# Patient Record
Sex: Female | Born: 1935 | Race: White | Hispanic: No | Marital: Married | Smoking: Never smoker
Health system: Southern US, Community
[De-identification: ages and names within clinical notes are randomized; demographics above are authoritative.]

## PROBLEM LIST (undated history)

## (undated) DIAGNOSIS — H539 Unspecified visual disturbance: Secondary | ICD-10-CM

## (undated) DIAGNOSIS — E785 Hyperlipidemia, unspecified: Secondary | ICD-10-CM

## (undated) DIAGNOSIS — R251 Tremor, unspecified: Secondary | ICD-10-CM

## (undated) DIAGNOSIS — Z950 Presence of cardiac pacemaker: Secondary | ICD-10-CM

## (undated) DIAGNOSIS — I495 Sick sinus syndrome: Secondary | ICD-10-CM

## (undated) DIAGNOSIS — I10 Essential (primary) hypertension: Secondary | ICD-10-CM

## (undated) DIAGNOSIS — Z7901 Long term (current) use of anticoagulants: Secondary | ICD-10-CM

## (undated) DIAGNOSIS — I480 Paroxysmal atrial fibrillation: Secondary | ICD-10-CM

## (undated) DIAGNOSIS — R600 Localized edema: Secondary | ICD-10-CM

## (undated) DIAGNOSIS — E039 Hypothyroidism, unspecified: Secondary | ICD-10-CM

## (undated) HISTORY — DX: Hyperlipidemia, unspecified: E78.5

## (undated) HISTORY — DX: Sick sinus syndrome: I49.5

## (undated) HISTORY — DX: Essential (primary) hypertension: I10

## (undated) HISTORY — DX: Unspecified visual disturbance: H53.9

## (undated) HISTORY — DX: Presence of cardiac pacemaker: Z95.0

## (undated) HISTORY — DX: Localized edema: R60.0

## (undated) HISTORY — DX: Paroxysmal atrial fibrillation: I48.0

## (undated) HISTORY — PX: CATARACT EXTRACTION: SUR2

## (undated) HISTORY — DX: Long term (current) use of anticoagulants: Z79.01

## (undated) HISTORY — DX: Tremor, unspecified: R25.1

---

## 1996-11-30 DIAGNOSIS — I480 Paroxysmal atrial fibrillation: Secondary | ICD-10-CM

## 1996-11-30 HISTORY — DX: Paroxysmal atrial fibrillation: I48.0

## 2003-12-01 HISTORY — PX: CHOLECYSTECTOMY: SHX55

## 2008-10-09 ENCOUNTER — Encounter: Admission: RE | Admit: 2008-10-09 | Discharge: 2008-10-09 | Payer: Self-pay | Admitting: Internal Medicine

## 2010-04-10 HISTORY — PX: US ECHOCARDIOGRAPHY: HXRAD669

## 2010-07-31 DIAGNOSIS — Z950 Presence of cardiac pacemaker: Secondary | ICD-10-CM

## 2010-07-31 HISTORY — DX: Presence of cardiac pacemaker: Z95.0

## 2010-08-25 ENCOUNTER — Ambulatory Visit: Payer: Self-pay | Admitting: Cardiology

## 2010-08-28 ENCOUNTER — Ambulatory Visit (HOSPITAL_COMMUNITY): Admission: RE | Admit: 2010-08-28 | Discharge: 2010-08-29 | Payer: Self-pay | Admitting: Cardiology

## 2010-08-28 ENCOUNTER — Ambulatory Visit: Payer: Self-pay | Admitting: Cardiology

## 2010-08-28 HISTORY — PX: INSERT / REPLACE / REMOVE PACEMAKER: SUR710

## 2010-09-01 ENCOUNTER — Encounter: Payer: Self-pay | Admitting: Internal Medicine

## 2010-09-01 ENCOUNTER — Ambulatory Visit: Payer: Self-pay | Admitting: Cardiology

## 2010-09-05 ENCOUNTER — Ambulatory Visit: Payer: Self-pay | Admitting: Cardiovascular Disease

## 2010-09-10 ENCOUNTER — Ambulatory Visit: Payer: Self-pay | Admitting: Cardiovascular Disease

## 2010-09-25 ENCOUNTER — Ambulatory Visit: Payer: Self-pay | Admitting: Cardiology

## 2010-10-02 ENCOUNTER — Telehealth (INDEPENDENT_AMBULATORY_CARE_PROVIDER_SITE_OTHER): Payer: Self-pay | Admitting: *Deleted

## 2010-10-20 ENCOUNTER — Encounter (INDEPENDENT_AMBULATORY_CARE_PROVIDER_SITE_OTHER): Payer: Self-pay | Admitting: *Deleted

## 2010-10-21 ENCOUNTER — Ambulatory Visit: Payer: Self-pay | Admitting: Cardiology

## 2010-10-22 ENCOUNTER — Telehealth (INDEPENDENT_AMBULATORY_CARE_PROVIDER_SITE_OTHER): Payer: Self-pay | Admitting: *Deleted

## 2010-11-06 ENCOUNTER — Encounter (INDEPENDENT_AMBULATORY_CARE_PROVIDER_SITE_OTHER): Payer: Self-pay | Admitting: *Deleted

## 2010-12-08 DIAGNOSIS — Z950 Presence of cardiac pacemaker: Secondary | ICD-10-CM | POA: Insufficient documentation

## 2010-12-09 ENCOUNTER — Ambulatory Visit
Admission: RE | Admit: 2010-12-09 | Discharge: 2010-12-09 | Payer: Self-pay | Source: Home / Self Care | Attending: Internal Medicine | Admitting: Internal Medicine

## 2010-12-09 ENCOUNTER — Encounter: Payer: Self-pay | Admitting: Internal Medicine

## 2010-12-09 ENCOUNTER — Telehealth: Payer: Self-pay | Admitting: Internal Medicine

## 2010-12-09 DIAGNOSIS — I4819 Other persistent atrial fibrillation: Secondary | ICD-10-CM | POA: Insufficient documentation

## 2010-12-09 DIAGNOSIS — I10 Essential (primary) hypertension: Secondary | ICD-10-CM | POA: Insufficient documentation

## 2010-12-30 NOTE — Letter (Signed)
Summary: Appointment - Reminder 2  [facility], Main Office  [address]   [place], [place] [address]   Phone: [phone]  Fax: [phone]     November 06, 2010 MRN: [id number]   Anne Macias [address] [place], [place]  [address]   Dear [name],  Our records indicate that it is time to schedule a follow-up appointment.  Dr.Taylor recommended that you follow up with [place] in January. It is very important that we reach you to schedule this appointment. We look forward to participating in your health care needs as we are now doing all pacemaker checks for [facility]. Please contact us at the number listed above at your earliest convenience to schedule your appointment.  If you are unable to make an appointment at this time, give [place] a call so we can update our records.     Sincerely,   Glass blower/designer

## 2010-12-30 NOTE — Progress Notes (Signed)
  Phone Note Call from Patient   Caller: Patient Summary of Call: pt calling in response to letter re past due wound ck, pt states she had this done at dr tennent's office in october Initial call taken by: Glynda Jaeger,  October 22, 2010 2:13 PM

## 2010-12-30 NOTE — Letter (Signed)
Summary: Appointment - Reminder 2  [facility], Main Office  [address]   [place], [place] [address]   Phone: [phone]  Fax: [phone]     October 20, 2010 MRN: [id number]   MONTGOMERY ROTHLISBERGER [address] [place], [place]  [address]   Dear Ms. QUIRARTE,  Our records indicate that it is time to schedule a follow-up appointment. Dr.Tennant recommended that you follow up with [place] in October. It is very important that we reach you to schedule this appointment. We look forward to participating in your health care needs. Please contact us at the number listed above at your earliest convenience to schedule your appointment.  If you are unable to make an appointment at this time, give [place] a call so we can update our records.     Sincerely,   Glass blower/designer

## 2010-12-30 NOTE — Miscellaneous (Signed)
Summary: Device preload  Clinical Lists Changes  Observations: Added new observation of PPM INDICATN: Sick sinus syndrome (09/01/2010 12:41) Added new observation of MAGNET RTE: BOL85 ERI  85 (09/01/2010 12:41) Added new observation of PPMLEADSTAT2: active (09/01/2010 12:41) Added new observation of PPMLEADSER2: [id number] (09/01/2010 12:41) Added new observation of PPMLEADMOD2: 5086  (09/01/2010 12:41) Added new observation of PPMLEADLOC2: RV  (09/01/2010 12:41) Added new observation of PPMLEADSTAT1: active  (09/01/2010 12:41) Added new observation of PPMLEADSER1: [id number]  (09/01/2010 12:41) Added new observation of PPMLEADMOD1: 5086  (09/01/2010 12:41) Added new observation of PPMLEADLOC1: RA  (09/01/2010 12:41) Added new observation of PPM IMP MD: Roger Shelter, MD  (09/01/2010 12:41) Added new observation of PPMLEADDOI2: 08/28/2010  (09/01/2010 12:41) Added new observation of PPMLEADDOI1: 08/28/2010  (09/01/2010 12:41) Added new observation of PPM DOI: 08/28/2010  (09/01/2010 12:41) Added new observation of PPM SERL#: [id number]  (09/01/2010 12:41) Added new observation of PPM MODL#: [id number]  (09/01/2010 08:65) Added new observation of PACEMAKERMFG: [employer]tronic  (09/01/2010 12:41) Added new observation of PPM REFER MD: Roger Shelter, MD  (09/01/2010 12:41) Added new observation of PACEMAKER MD: Lewayne Bunting, MD  (09/01/2010 12:41)      PPM Specifications Following MD:  Lewayne Bunting, MD     Referring MD:  Roger Shelter, MD PPM Vendor:  [employer]tronic     PPM Model Number:  [id number]     PPM Serial Number:  [id number] PPM DOI:  08/28/2010     PPM Implanting MD:  Roger Shelter, MD  Lead 1    Location: RA     DOI: 08/28/2010     Model #: [id number]     Serial #: [id number]     Status: active Lead 2    Location: RV     DOI: 08/28/2010     Model #: [id number]     Serial #: [id number]     Status: active  Magnet Response Rate:  BOL85 ERI  85  Indications:  Sick sinus syndrome

## 2010-12-30 NOTE — Progress Notes (Signed)
  Phone Note Call from Patient   Caller: Patient Reason for Call: Talk to Nurse Summary of Call: called pt 10-20 to rs missed device clinic appt, pt states she has scheduling problems right now and will call back when able to come in Initial call taken by: Glynda Jaeger,  October 02, 2010 3:10 PM

## 2011-01-01 NOTE — Cardiovascular Report (Signed)
Summary: Office Visit   Office Visit   Imported By: Roderic Ovens 12/23/2010 13:43:28  _____________________________________________________________________  External Attachment:    Type:   Image     Comment:   External Document

## 2011-01-01 NOTE — Progress Notes (Signed)
Summary: re meds  Phone Note Call from Patient   Caller: Patient Summary of Call: pt calling back re meds pt states she takes metoprolol 50 mg once a day.  Initial call taken by: Roe Coombs,  December 09, 2010 2:50 PM  Follow-up for Phone Call        entered into medication list Dennis Bast, RN, BSN  December 09, 2010 3:18 PM    New/Updated Medications: METOPROLOL SUCCINATE 50 MG XR24H-TAB (METOPROLOL SUCCINATE) one by mouth daily

## 2011-01-01 NOTE — Assessment & Plan Note (Signed)
Summary: PACER CHECK.MDT.AMBER   History of Present Illness: Mrs. Ciano is referred today by Dr. Deborah Chalk for establishment in our Pacemaker clinic.  The patient is a very pleasant 77 yo woman with a h/o PAF, HTN, and is s/p PPM for tachybrady syndrome.  She denies c/p or syncope but she does have an occaisional dizzy spell.  No frank syncope. She had a large hematoma after her PPM which has resolved. She has rare palpitations.  Current Medications (verified): 1)  Caltrate 600+d Plus 600-400 Mg-Unit Tabs (Calcium Carbonate-Vit D-Min) .... Two Times A Day 2)  Multivitamins   Tabs (Multiple Vitamin) .... Once Daily 3)  Benefiber  Powd (Wheat Dextrin) .... Two Times A Day 4)  Colace 100 Mg Caps (Docusate Sodium) .... 2 Tabs Once Daily 5)  Warfarin Sodium 5 Mg Tabs (Warfarin Sodium) .... Use As Directed By Anticoagulation Clinic 6)  Losartan Potassium-Hctz 100-12.5 Mg Tabs (Losartan Potassium-Hctz) .... Once Daily 7)  Simvastatin 40 Mg Tabs (Simvastatin) .... Take One Tablet By Mouth Daily At Bedtime  Allergies (verified): No Known Drug Allergies  Past History:  Past Medical History: Current Problems:  PACEMAKER, PERMANENT (ICD-V45.01) HTN Bradycardia    Review of Systems       All systems reviewed and negative except as noted in the HPI.  Vital Signs:  Patient profile:   78 year old female Height:      67 inches Weight:      151 pounds BMI:     23.74 Pulse rate:   60 / minute BP sitting:   164 / 70  (left arm)  Vitals Entered By: Laurance Flatten CMA (December 09, 2010 11:34 AM)  Physical Exam  General:  Elderly, wWell developed, well nourished, in no acute distress.  HEENT: normal Neck: supple. No JVD. Carotids 2+ bilaterally no bruits Cor: RRR no rubs, gallops or murmur Lungs: CTA. Well healed PPM incision. Ab: soft, nontender. nondistended. No HSM. Good bowel sounds Ext: warm. no cyanosis, clubbing or edema Neuro: alert and oriented. Grossly nonfocal. affect  pleasant    PPM Specifications Following MD:  Lewayne Bunting, MD     Referring MD:  Roger Shelter, MD PPM Vendor:  [employer]     PPM Model Number:  [facility]     PPM Serial Number:  [id number] PPM DOI:  08/28/2010     PPM Implanting MD:  Roger Shelter, MD  Lead 1    Location: RA     DOI: 08/28/2010     Model #: [id number]     Serial #: [id number]     Status: active Lead 2    Location: RV     DOI: 08/28/2010     Model #: [id number]     Serial #: [id number]     Status: active  Magnet Response Rate:  BOL85 ERI  85  Indications:  Sick sinus syndrome   PPM Follow Up Battery Voltage:  3.03 V       PPM Device Measurements Atrium  Amplitude: 2.7 mV, Impedance: 736 ohms, Threshold: 0.5 V at 0.40 msec Right Ventricle  Amplitude: 8.3 mV, Impedance: 664 ohms, Threshold: 1.0 V at 0.4 msec  Episodes MS Episodes:  0     Ventricular High Rate:  1     Atrial Pacing:  79.1%     Ventricular Pacing:  0.2%  Parameters Mode:  MVP     Lower Rate Limit:  60     Upper Rate Limit:  130 Paced AV Delay:  180  Sensed AV Delay:  150 Next Cardiology Appt Due:  08/03/2011 Tech Comments:  NORMAL DEVICE FUNCTIION.  1 EPISODE OF VT---1:1 TACHYCARDIA.  CHANGED RA OUTPUT FROM 3.0 TO 2.0 V AND RV OUTPUT FROM 3.0 TO 2.5 V.  ROV IN SEPT 2012 W/GT. Vella Kohler  December 09, 2010 11:54 AM MD Comments:  Agree with above.  Impression & Recommendations:  Problem # 1:  PACEMAKER, PERMANENT (ICD-V45.01) Her device is working normally.  Will recheck in several months.  Problem # 2:  ESSENTIAL HYPERTENSION, BENIGN (ICD-401.1) Her blood pressure is elevated today. She will continue her current meds and maintain a low sodium diet. Her updated medication list for this problem includes:    Losartan Potassium-hctz 100-12.5 Mg Tabs (Losartan potassium-hctz) ..... Once daily  Problem # 3:  ATRIAL FIBRILLATION (ICD-427.31) She has maintained NSR very nicely with only minimal bouts of non-sustained atrial tachy. Her updated  medication list for this problem includes:    Warfarin Sodium 5 Mg Tabs (Warfarin sodium) ..... Use as directed by anticoagulation clinic  Patient Instructions: 1)  Your physician wants you to follow-up in: 8 months with Dr Court Joy will receive a reminder letter in the mail two months in advance. If you don't receive a letter, please call our office to schedule the follow-up appointment. 2)  Your physician recommends that you continue on your current medications as directed. Please refer to the Current Medication list given to you today.

## 2011-02-12 LAB — PROTIME-INR
INR: 1.04 (ref 0.00–1.49)
INR: 1.08 (ref 0.00–1.49)
Prothrombin Time: 13.8 seconds (ref 11.6–15.2)

## 2011-03-11 ENCOUNTER — Telehealth: Payer: Self-pay | Admitting: Cardiology

## 2011-03-11 NOTE — Telephone Encounter (Signed)
RECVD CALL FROM PT RETURNING KELLY'S CALL REGARDING FUTURE APPT WITH LB.

## 2011-12-03 ENCOUNTER — Encounter: Payer: Self-pay | Admitting: Nurse Practitioner

## 2011-12-03 ENCOUNTER — Telehealth: Payer: Self-pay | Admitting: Internal Medicine

## 2011-12-03 NOTE — Telephone Encounter (Signed)
New Msg: Pt work calling stating that pt is in a-fib. Pt has been in a-fib since last night. Pt has a little bit of sob. Pt would like to be seen today if possible and/or advised as to what to do. Please return pt call to discuss further.

## 2011-12-03 NOTE — Telephone Encounter (Signed)
Former Music therapist patient. Has seen Dr. Ladona Ridgel. Last office note from Dr. Ladona Ridgel stated he would see the patient back. I don't know why follow up is scheduled for Dr. Graciela Husbands. Will forward to [place].

## 2011-12-03 NOTE — Telephone Encounter (Signed)
Discussed with Norma Fredrickson, NP she is going to see patient tomorrow at 8:30am   Patient aware

## 2011-12-04 ENCOUNTER — Ambulatory Visit (INDEPENDENT_AMBULATORY_CARE_PROVIDER_SITE_OTHER): Payer: Medicare Other | Admitting: Nurse Practitioner

## 2011-12-04 ENCOUNTER — Encounter: Payer: Self-pay | Admitting: Nurse Practitioner

## 2011-12-04 VITALS — BP 150/60 | HR 60 | Ht 65.5 in | Wt 156.0 lb

## 2011-12-04 DIAGNOSIS — I4891 Unspecified atrial fibrillation: Secondary | ICD-10-CM

## 2011-12-04 DIAGNOSIS — I10 Essential (primary) hypertension: Secondary | ICD-10-CM

## 2011-12-04 DIAGNOSIS — I480 Paroxysmal atrial fibrillation: Secondary | ICD-10-CM

## 2011-12-04 LAB — BASIC METABOLIC PANEL
BUN: 17 mg/dL (ref 6–23)
CO2: 31 mEq/L (ref 19–32)
Calcium: 8.5 mg/dL (ref 8.4–10.5)
Chloride: 102 mEq/L (ref 96–112)
Creatinine, Ser: 0.9 mg/dL (ref 0.4–1.2)
GFR: 62.19 mL/min (ref >60.00)
Glucose, Bld: 109 mg/dL — ABNORMAL HIGH (ref 70–99)
Potassium: 4 mEq/L (ref 3.5–5.1)
Sodium: 139 mEq/L (ref 135–145)

## 2011-12-04 LAB — TSH: TSH: 1.05 u[IU]/mL (ref 0.35–5.50)

## 2011-12-04 NOTE — Assessment & Plan Note (Signed)
No medicines here today. Blood pressure at home has been well controlled.

## 2011-12-04 NOTE — Assessment & Plan Note (Signed)
She is in sinus today. I suspect she did have a short lived episode of atrial fib. She did not try any extra medicine and I have suggested she take an extra half of her metoprolol if this recurs. She remains on her coumadin.  I suspect this was triggered from her recent bronchitis. She will be seeing Dr. Graciela Husbands in March. This is who she wishes to have her cardiology follow up with. I will be happy to see her back as well. We will check some labs today just to make sure nothing else is going on. Overall, I think she is doing well. Patient is agreeable to this plan and will call if any problems develop in the interim.

## 2011-12-04 NOTE — Progress Notes (Signed)
Anne Macias Date of Birth: [birth year] Medical Record #[id number]  History of Present Illness: Anne Macias is seen today for a work in visit. She is seen for Dr. Ladona Ridgel but is to be followed by Dr. Graciela Husbands in light of Dr. Ronnald Nian retirement. She is 78 years of age. Has a pacemaker in place for tachy brady. On coumadin. Patient called yesterday to report that she was back in atrial fib. Felt a little short of breath. This spell happened Wednesday night. She had finished dinner and was going to get ready for bed. Upon getting out of the chair, had the onset of palpitations. Didn't last very long. Maybe less than an hour. Went on to bed and had a good night. The next morning, she still had some palpitations, but to a lesser degree. She then called and comes in today for evaluation. Did fine yesterday with no problems. Blood pressure at home has been all right at home. No medicines taken today. Did not try any extra medicines with her palpitations. She has just recently recovered from bronchitis.   Current Outpatient Prescriptions on File Prior to Visit  Medication Sig Dispense Refill  . Calcium Carbonate-Vitamin D (CALTRATE 600+D PO) Take 2 tablets by mouth daily.        Marland Kitchen losartan-hydrochlorothiazide (HYZAAR) 50-12.5 MG per tablet Take 2 tablets by mouth daily.        . metoprolol (TOPROL-XL) 50 MG 24 hr tablet Take 50 mg by mouth daily.        . Multiple Vitamin (MULTIVITAMIN) tablet Take 1 tablet by mouth daily.        . simvastatin (ZOCOR) 40 MG tablet Take 40 mg by mouth every evening.        . warfarin (COUMADIN) 5 MG tablet Take 5 mg by mouth as directed.          No Known Allergies  Past Medical History  Diagnosis Date  . Edema of lower extremity   . PAF (paroxysmal atrial fibrillation) 1998  . Hypertension   . Hyperlipidemia   . Visual disturbance   . Tachy-brady syndrome   . Chronic anticoagulation   . S/P cardiac pacemaker procedure Sept 2011    complicated by pocket hematoma     Past Surgical History  Procedure Date  . Insert / replace / remove pacemaker 08/28/2010    implantaton  . Cholecystectomy 2005  . Cataract extraction   . US echocardiography 04/10/2010    EF 55-60%    History  Smoking status  . Never Smoker   Smokeless tobacco  . Not on file    History  Alcohol Use No    Family History  Problem Relation Age of Onset  . Heart attack Mother   . Pneumonia Father   . Heart attack Brother   . Alcohol abuse Brother     Review of Systems: The review of systems is positive for occasional swelling of her ankles. Rare caffeine. No chest pain. Trying to stay active. Walks every day. Has recently twisted her knee but no falls. Tolerating her coumadin well.  All other systems were reviewed and are negative.  Physical Exam: Ht 5' 5.5" (1.664 m)  Wt 156 lb (70.761 kg)  BMI 25.56 kg/m2 Patient is very pleasant and in no acute distress. Skin is warm and dry. Color is normal.  HEENT is unremarkable. Normocephalic/atraumatic. PERRL. Sclera are nonicteric. Neck is supple. No masses. No JVD. Lungs are clear. Cardiac exam shows a regular rate and rhythm today. Abdomen  is soft. Extremities are without any significant edema. Gait and ROM are intact. No gross neurologic deficits noted.  LABORATORY DATA: EKG today shows atrial pacing. She is in sinus.    Assessment / Plan:

## 2011-12-04 NOTE — Patient Instructions (Addendum)
You are in rhythm today. You may try an extra half of Metoprolol if you have a spell of palpitations.   Call the [facility] office at [phone] if you have any questions, problems or concerns.

## 2011-12-08 DIAGNOSIS — Z7901 Long term (current) use of anticoagulants: Secondary | ICD-10-CM | POA: Diagnosis not present

## 2012-01-05 DIAGNOSIS — M810 Age-related osteoporosis without current pathological fracture: Secondary | ICD-10-CM | POA: Diagnosis not present

## 2012-01-05 DIAGNOSIS — Z7901 Long term (current) use of anticoagulants: Secondary | ICD-10-CM | POA: Diagnosis not present

## 2012-01-08 DIAGNOSIS — Z7901 Long term (current) use of anticoagulants: Secondary | ICD-10-CM | POA: Diagnosis not present

## 2012-01-15 DIAGNOSIS — Z7901 Long term (current) use of anticoagulants: Secondary | ICD-10-CM | POA: Diagnosis not present

## 2012-02-12 DIAGNOSIS — Z7901 Long term (current) use of anticoagulants: Secondary | ICD-10-CM | POA: Diagnosis not present

## 2012-02-17 ENCOUNTER — Encounter: Payer: Self-pay | Admitting: Internal Medicine

## 2012-02-17 ENCOUNTER — Ambulatory Visit (INDEPENDENT_AMBULATORY_CARE_PROVIDER_SITE_OTHER): Payer: Medicare Other | Admitting: Internal Medicine

## 2012-02-17 VITALS — BP 170/76 | HR 60 | Ht 66.0 in | Wt 150.0 lb

## 2012-02-17 DIAGNOSIS — I495 Sick sinus syndrome: Secondary | ICD-10-CM | POA: Insufficient documentation

## 2012-02-17 DIAGNOSIS — I4891 Unspecified atrial fibrillation: Secondary | ICD-10-CM

## 2012-02-17 DIAGNOSIS — I10 Essential (primary) hypertension: Secondary | ICD-10-CM

## 2012-02-17 DIAGNOSIS — Z950 Presence of cardiac pacemaker: Secondary | ICD-10-CM | POA: Diagnosis not present

## 2012-02-17 DIAGNOSIS — M25559 Pain in unspecified hip: Secondary | ICD-10-CM | POA: Diagnosis not present

## 2012-02-17 LAB — PACEMAKER DEVICE OBSERVATION
AL AMPLITUDE: 5 mv
AL THRESHOLD: 0.5 V
RV LEAD AMPLITUDE: 11.2 mv

## 2012-02-17 NOTE — Assessment & Plan Note (Signed)
80% atrially paced; heart rate excursion is blunted. Patient does not want the device reprogrammed

## 2012-02-17 NOTE — Progress Notes (Signed)
  HPI  Anne Macias is a 78 y.o. female Former patient of Dr. Deborah Chalk status post pacemaker implantation for tachybradycardia syndrome. Has Medtr[employer] device.  She also history of hypertension and atrial fibrillation; she is on warfarin  The patient denies chest pain, shortness of breath, nocturnal dyspnea, orthopnea or peripheral edema.  There have been no palpitations, lightheadedness or syncope.    Past Medical History  Diagnosis Date  . Edema of lower extremity   . PAF (paroxysmal atrial fibrillation) 1998  . Hypertension   . Hyperlipidemia   . Visual disturbance   . Tachy-brady syndrome   . Chronic anticoagulation   . S/P cardiac pacemaker procedure Sept 2011    complicated by pocket hematoma    Past Surgical History  Procedure Date  . Insert / replace / remove pacemaker 08/28/2010    implantaton  . Cholecystectomy 2005  . Cataract extraction   . US echocardiography 04/10/2010    EF 55-60%    Current Outpatient Prescriptions  Medication Sig Dispense Refill  . Calcium Carbonate-Vitamin D (CALTRATE 600+D PO) Take 2 tablets by mouth daily.        Marland Kitchen losartan-hydrochlorothiazide (HYZAAR) 50-12.5 MG per tablet Take 2 tablets by mouth daily.        . metoprolol (TOPROL-XL) 50 MG 24 hr tablet Take 50 mg by mouth daily.        . Multiple Vitamin (MULTIVITAMIN) tablet Take 1 tablet by mouth daily.        . simvastatin (ZOCOR) 40 MG tablet Take 40 mg by mouth every evening.        . warfarin (COUMADIN) 5 MG tablet Take 5 mg by mouth as directed.          No Known Allergies  Review of Systems negative except from HPI and PMH  Physical Exam BP 170/76  Pulse 60  Ht 5\' 6"  (1.676 m)  Wt 150 lb (68.04 kg)  BMI 24.21 kg/m2  Well developed and well nourished in no acute distress   HENT normal Neck supple with JVP-flat Clear Regular rate and rhythm, no murmurs or gallops Abd-soft with active BS No Clubbing cyanosis edema Skin-warm and dry A & Oriented  Grossly normal  sensory and motor function    Assessment and  Plan

## 2012-02-17 NOTE — Assessment & Plan Note (Signed)
Systolic hypertension; likely inadequately controlled. They measured her blood pressures at home but had not recently. I've encouraged him to do this and to follow up with her PCP

## 2012-02-17 NOTE — Assessment & Plan Note (Signed)
Paroxysmal and infrequent on warfain

## 2012-02-17 NOTE — Assessment & Plan Note (Signed)
The patient's device was interrogated.  The information was reviewed. No changes were made in the programming.    

## 2012-02-17 NOTE — Patient Instructions (Signed)
Your physician wants you to follow-up in: 6 months with Kristin/Paula for a device check & 1 year with Dr. Klein. You will receive a reminder letter in the mail two months in advance. If you don't receive a letter, please call our office to schedule the follow-up appointment.  Your physician recommends that you continue on your current medications as directed. Please refer to the Current Medication list given to you today.  

## 2012-02-23 DIAGNOSIS — M1710 Unilateral primary osteoarthritis, unspecified knee: Secondary | ICD-10-CM | POA: Diagnosis not present

## 2012-03-10 DIAGNOSIS — Z7901 Long term (current) use of anticoagulants: Secondary | ICD-10-CM | POA: Diagnosis not present

## 2012-03-15 ENCOUNTER — Telehealth: Payer: Self-pay | Admitting: Physician Assistant

## 2012-03-15 NOTE — Telephone Encounter (Signed)
Pt called because heart rate was elevated today. She was otherwise in her usual state of health. When her heart rate was elevated, as high as 140, she did not exercise but rested quietly. When her heart rate did not improve, her husband requested that she call the office. Prior to her return call, her heart rate dropped to 61 and is stable. She does not think her heart rate is irregular at this time. She was aware earlier that it was irregular. She denies shortness of breath, chest pain or presyncope. She says this rarely happens. She is anticoagulated with Coumadin and taking all of her medications as prescribed.  I advised her to keep letting us know whenever she gets these episodes and if they increase in frequency or intensity. She is to keep all scheduled appointments with the Coumadin clinic and with Dr. Graciela Husbands and contact us if she needs further assistance.

## 2012-03-21 DIAGNOSIS — M1710 Unilateral primary osteoarthritis, unspecified knee: Secondary | ICD-10-CM | POA: Diagnosis not present

## 2012-03-24 DIAGNOSIS — H43829 Vitreomacular adhesion, unspecified eye: Secondary | ICD-10-CM | POA: Diagnosis not present

## 2012-03-24 DIAGNOSIS — H35369 Drusen (degenerative) of macula, unspecified eye: Secondary | ICD-10-CM | POA: Diagnosis not present

## 2012-04-07 DIAGNOSIS — Z7901 Long term (current) use of anticoagulants: Secondary | ICD-10-CM | POA: Diagnosis not present

## 2012-05-09 DIAGNOSIS — Z7901 Long term (current) use of anticoagulants: Secondary | ICD-10-CM | POA: Diagnosis not present

## 2012-05-12 DIAGNOSIS — K5909 Other constipation: Secondary | ICD-10-CM | POA: Diagnosis not present

## 2012-05-12 DIAGNOSIS — I10 Essential (primary) hypertension: Secondary | ICD-10-CM | POA: Diagnosis not present

## 2012-05-12 DIAGNOSIS — E785 Hyperlipidemia, unspecified: Secondary | ICD-10-CM | POA: Diagnosis not present

## 2012-05-23 DIAGNOSIS — Z7901 Long term (current) use of anticoagulants: Secondary | ICD-10-CM | POA: Diagnosis not present

## 2012-06-20 DIAGNOSIS — Z7901 Long term (current) use of anticoagulants: Secondary | ICD-10-CM | POA: Diagnosis not present

## 2012-07-12 DIAGNOSIS — Z7901 Long term (current) use of anticoagulants: Secondary | ICD-10-CM | POA: Diagnosis not present

## 2012-07-26 DIAGNOSIS — Z7901 Long term (current) use of anticoagulants: Secondary | ICD-10-CM | POA: Diagnosis not present

## 2012-08-23 DIAGNOSIS — Z7901 Long term (current) use of anticoagulants: Secondary | ICD-10-CM | POA: Diagnosis not present

## 2012-09-20 DIAGNOSIS — Z23 Encounter for immunization: Secondary | ICD-10-CM | POA: Diagnosis not present

## 2012-09-20 DIAGNOSIS — Z7901 Long term (current) use of anticoagulants: Secondary | ICD-10-CM | POA: Diagnosis not present

## 2012-10-18 DIAGNOSIS — Z7901 Long term (current) use of anticoagulants: Secondary | ICD-10-CM | POA: Diagnosis not present

## 2012-11-11 DIAGNOSIS — I10 Essential (primary) hypertension: Secondary | ICD-10-CM | POA: Diagnosis not present

## 2012-11-11 DIAGNOSIS — Z7901 Long term (current) use of anticoagulants: Secondary | ICD-10-CM | POA: Diagnosis not present

## 2012-11-15 ENCOUNTER — Telehealth: Payer: Self-pay | Admitting: Internal Medicine

## 2012-11-15 NOTE — Telephone Encounter (Signed)
11-15-12 called pt re rs cxl appt 08-11-12, she said she has a lot of company until the first of the year due to the holidays, will call back to rs/mt

## 2012-11-21 DIAGNOSIS — R197 Diarrhea, unspecified: Secondary | ICD-10-CM | POA: Diagnosis not present

## 2012-12-07 DIAGNOSIS — Z7901 Long term (current) use of anticoagulants: Secondary | ICD-10-CM | POA: Diagnosis not present

## 2012-12-07 DIAGNOSIS — R630 Anorexia: Secondary | ICD-10-CM | POA: Diagnosis not present

## 2012-12-07 DIAGNOSIS — R350 Frequency of micturition: Secondary | ICD-10-CM | POA: Diagnosis not present

## 2012-12-09 DIAGNOSIS — Z7901 Long term (current) use of anticoagulants: Secondary | ICD-10-CM | POA: Diagnosis not present

## 2012-12-16 DIAGNOSIS — H353190 Nonexudative age-related macular degeneration, unspecified eye, stage unspecified: Secondary | ICD-10-CM | POA: Diagnosis not present

## 2012-12-16 DIAGNOSIS — H35369 Drusen (degenerative) of macula, unspecified eye: Secondary | ICD-10-CM | POA: Diagnosis not present

## 2012-12-16 DIAGNOSIS — H43819 Vitreous degeneration, unspecified eye: Secondary | ICD-10-CM | POA: Diagnosis not present

## 2012-12-23 DIAGNOSIS — Z7901 Long term (current) use of anticoagulants: Secondary | ICD-10-CM | POA: Diagnosis not present

## 2013-01-04 DIAGNOSIS — Z7901 Long term (current) use of anticoagulants: Secondary | ICD-10-CM | POA: Diagnosis not present

## 2013-02-28 ENCOUNTER — Encounter: Payer: Medicare Other | Admitting: Internal Medicine

## 2013-02-28 DIAGNOSIS — Z7901 Long term (current) use of anticoagulants: Secondary | ICD-10-CM | POA: Diagnosis not present

## 2013-03-15 ENCOUNTER — Ambulatory Visit (INDEPENDENT_AMBULATORY_CARE_PROVIDER_SITE_OTHER): Payer: Medicare Other | Admitting: Internal Medicine

## 2013-03-15 ENCOUNTER — Encounter: Payer: Self-pay | Admitting: Internal Medicine

## 2013-03-15 VITALS — BP 168/74 | HR 59 | Ht 65.5 in | Wt 149.8 lb

## 2013-03-15 DIAGNOSIS — Z950 Presence of cardiac pacemaker: Secondary | ICD-10-CM

## 2013-03-15 DIAGNOSIS — I4891 Unspecified atrial fibrillation: Secondary | ICD-10-CM | POA: Diagnosis not present

## 2013-03-15 DIAGNOSIS — I495 Sick sinus syndrome: Secondary | ICD-10-CM

## 2013-03-15 LAB — PACEMAKER DEVICE OBSERVATION
ATRIAL PACING PM: 86.27
BAMS-0001: 170 {beats}/min
BATTERY VOLTAGE: 3.02 V

## 2013-03-15 NOTE — Assessment & Plan Note (Signed)
The patient's device was interrogated.  The information was reviewed. No changes were made in the programming.    

## 2013-03-15 NOTE — Progress Notes (Signed)
Patient Care Team: Lillia Mountain, MD as PCP - General (Internal Medicine)   HPI  Anne Macias is a 79 y.o. female former patient of Dr. Deborah Chalk; she is status post pacemaker implantation for tachybradycardia syndrome.    She also history of hypertension and atrial fibrillation; she is on warfarin    The patient denies chest pain, shortness of breath, nocturnal dyspnea, orthopnea or peripheral edema. There have been no palpitations, lightheadedness or syncope.    Past Medical History  Diagnosis Date  . Edema of lower extremity   . PAF (paroxysmal atrial fibrillation) 1998  . Hypertension   . Hyperlipidemia   . Visual disturbance   . Tachy-brady syndrome   . Chronic anticoagulation   . S/P cardiac pacemaker procedure Sept 2011    complicated by pocket hematoma    Past Surgical History  Procedure Laterality Date  . Insert / replace / remove pacemaker  08/28/2010    implantaton  . Cholecystectomy  2005  . Cataract extraction    . US echocardiography  04/10/2010    EF 55-60%    Current Outpatient Prescriptions  Medication Sig Dispense Refill  . Calcium Carbonate-Vitamin D (CALTRATE 600+D PO) Take 2 tablets by mouth daily.        Marland Kitchen losartan-hydrochlorothiazide (HYZAAR) 100-25 MG per tablet Take 1 tablet by mouth daily.      . metoprolol (TOPROL-XL) 50 MG 24 hr tablet Take 50 mg by mouth daily.        . Multiple Vitamin (MULTIVITAMIN) tablet Take 1 tablet by mouth daily.        . simvastatin (ZOCOR) 40 MG tablet Take 40 mg by mouth every evening.        . warfarin (COUMADIN) 5 MG tablet Take 5 mg by mouth as directed.         No current facility-administered medications for this visit.    No Known Allergies  Review of Systems negative except from HPI and PMH  Physical Exam BP 168/74  Pulse 59  Ht 5' 5.5" (1.664 m)  Wt 149 lb 12.8 oz (67.949 kg)  BMI 24.54 kg/m2 Well developed and well nourished in no acute distress HENT normal E scleral and icterus  clear Neck Supple JVP flat; carotids brisk and full Clear to ausculation  Regular rate and rhythm, no murmurs gallops or rub Soft with active bowel sounds No clubbing cyanosis 1+ Edema Alert and oriented, grossly normal motor and sensory function Skin Warm and Dry    Assessment and  Plan

## 2013-03-15 NOTE — Assessment & Plan Note (Signed)
Paroxysms of atrial fibrillation rates are modestly rapid. However, the overall burden is trivial. She is on anticoagulation

## 2013-03-15 NOTE — Patient Instructions (Addendum)
Your physician wants you to follow-up in: ONE YEAR WITH DR KLEIN You will receive a reminder letter in the mail two months in advance. If you don't receive a letter, please call our office to schedule the follow-up appointment.    

## 2013-03-16 ENCOUNTER — Encounter: Payer: Medicare Other | Admitting: Internal Medicine

## 2013-03-21 ENCOUNTER — Encounter: Payer: Self-pay | Admitting: Cardiology

## 2013-03-28 DIAGNOSIS — Z7901 Long term (current) use of anticoagulants: Secondary | ICD-10-CM | POA: Diagnosis not present

## 2013-04-13 DIAGNOSIS — H353190 Nonexudative age-related macular degeneration, unspecified eye, stage unspecified: Secondary | ICD-10-CM | POA: Diagnosis not present

## 2013-04-13 DIAGNOSIS — H35369 Drusen (degenerative) of macula, unspecified eye: Secondary | ICD-10-CM | POA: Diagnosis not present

## 2013-04-13 DIAGNOSIS — H43819 Vitreous degeneration, unspecified eye: Secondary | ICD-10-CM | POA: Diagnosis not present

## 2013-04-25 DIAGNOSIS — Z7901 Long term (current) use of anticoagulants: Secondary | ICD-10-CM | POA: Diagnosis not present

## 2013-05-23 DIAGNOSIS — Z7901 Long term (current) use of anticoagulants: Secondary | ICD-10-CM | POA: Diagnosis not present

## 2013-06-01 DIAGNOSIS — Z0000 Encounter for general adult medical examination without abnormal findings: Secondary | ICD-10-CM | POA: Diagnosis not present

## 2013-06-01 DIAGNOSIS — I10 Essential (primary) hypertension: Secondary | ICD-10-CM | POA: Diagnosis not present

## 2013-06-01 DIAGNOSIS — E785 Hyperlipidemia, unspecified: Secondary | ICD-10-CM | POA: Diagnosis not present

## 2013-06-01 DIAGNOSIS — Z1331 Encounter for screening for depression: Secondary | ICD-10-CM | POA: Diagnosis not present

## 2013-06-20 DIAGNOSIS — Z7901 Long term (current) use of anticoagulants: Secondary | ICD-10-CM | POA: Diagnosis not present

## 2013-07-18 DIAGNOSIS — Z7901 Long term (current) use of anticoagulants: Secondary | ICD-10-CM | POA: Diagnosis not present

## 2013-08-02 DIAGNOSIS — K130 Diseases of lips: Secondary | ICD-10-CM | POA: Diagnosis not present

## 2013-08-02 DIAGNOSIS — D235 Other benign neoplasm of skin of trunk: Secondary | ICD-10-CM | POA: Diagnosis not present

## 2013-08-15 DIAGNOSIS — Z7901 Long term (current) use of anticoagulants: Secondary | ICD-10-CM | POA: Diagnosis not present

## 2013-09-11 ENCOUNTER — Ambulatory Visit (INDEPENDENT_AMBULATORY_CARE_PROVIDER_SITE_OTHER): Payer: Medicare Other | Admitting: *Deleted

## 2013-09-11 DIAGNOSIS — I495 Sick sinus syndrome: Secondary | ICD-10-CM

## 2013-09-11 LAB — PACEMAKER DEVICE OBSERVATION
AL AMPLITUDE: 2.6944 mv
AL IMPEDENCE PM: 496 Ohm

## 2013-09-11 NOTE — Progress Notes (Signed)
Pacemaker check in clinic. Normal device function. Thresholds, sensing, impedances consistent with previous measurements. Device programmed to maximize longevity. 1.4% mode switched, + coumadin.  3 VT episodes.   Device programmed at appropriate safety margins. Histogram distribution appropriate for patient activity level. Device programmed to optimize intrinsic conduction.  Patient enrolled in remote follow-up/TTM's with [employer]. Plan to follow every 3 months remotely and see annually in office. Patient education completed.  [facility] 12/15/13.

## 2013-09-12 DIAGNOSIS — Z7901 Long term (current) use of anticoagulants: Secondary | ICD-10-CM | POA: Diagnosis not present

## 2013-09-25 ENCOUNTER — Encounter: Payer: Self-pay | Admitting: Internal Medicine

## 2013-09-25 DIAGNOSIS — L259 Unspecified contact dermatitis, unspecified cause: Secondary | ICD-10-CM | POA: Diagnosis not present

## 2013-10-10 DIAGNOSIS — Z7901 Long term (current) use of anticoagulants: Secondary | ICD-10-CM | POA: Diagnosis not present

## 2013-10-10 DIAGNOSIS — I4891 Unspecified atrial fibrillation: Secondary | ICD-10-CM | POA: Diagnosis not present

## 2013-10-24 DIAGNOSIS — Z7901 Long term (current) use of anticoagulants: Secondary | ICD-10-CM | POA: Diagnosis not present

## 2013-10-24 DIAGNOSIS — I4891 Unspecified atrial fibrillation: Secondary | ICD-10-CM | POA: Diagnosis not present

## 2013-11-07 DIAGNOSIS — I4891 Unspecified atrial fibrillation: Secondary | ICD-10-CM | POA: Diagnosis not present

## 2013-11-07 DIAGNOSIS — Z7901 Long term (current) use of anticoagulants: Secondary | ICD-10-CM | POA: Diagnosis not present

## 2013-11-13 DIAGNOSIS — I10 Essential (primary) hypertension: Secondary | ICD-10-CM | POA: Diagnosis not present

## 2013-12-05 DIAGNOSIS — I4891 Unspecified atrial fibrillation: Secondary | ICD-10-CM | POA: Diagnosis not present

## 2013-12-05 DIAGNOSIS — Z7901 Long term (current) use of anticoagulants: Secondary | ICD-10-CM | POA: Diagnosis not present

## 2013-12-15 ENCOUNTER — Ambulatory Visit (INDEPENDENT_AMBULATORY_CARE_PROVIDER_SITE_OTHER): Payer: Medicare Other | Admitting: *Deleted

## 2013-12-15 DIAGNOSIS — I4891 Unspecified atrial fibrillation: Secondary | ICD-10-CM | POA: Diagnosis not present

## 2013-12-15 DIAGNOSIS — I495 Sick sinus syndrome: Secondary | ICD-10-CM | POA: Diagnosis not present

## 2013-12-18 ENCOUNTER — Telehealth: Payer: Self-pay | Admitting: Internal Medicine

## 2013-12-18 NOTE — Telephone Encounter (Signed)
New message     C/o palpitations, weak and wobbly.  No sob,chest pain or any other symptoms.  Husband want to talk to a nurse.

## 2013-12-18 NOTE — Telephone Encounter (Signed)
Pt called because she states she usually walk for exercise every day . Today when she was walking she started to feels lite headed her heart was racing, she felt like she was getting a cold in her throat,  weak. Now she feels fine except she is tired . Pt states she won't try to exercise and  is resting because she does not want to have that feeling again. Pt takes Metoprolol 50 mg (Toprol XL) once a day.  Dr. Harrington Challenger DOD okay to let Dr. Lequita Halt MD to review  Note and make  Recommendations. Pt aware and agreed.

## 2013-12-19 LAB — MDC_IDC_ENUM_SESS_TYPE_REMOTE
Brady Statistic RA Percent Paced: 95.77 %
Date Time Interrogation Session: 20150116175827
Lead Channel Setting Pacing Amplitude: 2 V
Lead Channel Setting Pacing Pulse Width: 0.4 ms
Lead Channel Setting Sensing Sensitivity: 0.9 mV
MDC IDC MSMT BATTERY VOLTAGE: 3 V
MDC IDC MSMT LEADCHNL RA IMPEDANCE VALUE: 504 Ohm
MDC IDC MSMT LEADCHNL RA SENSING INTR AMPL: 2.2 mV
MDC IDC MSMT LEADCHNL RV IMPEDANCE VALUE: 624 Ohm
MDC IDC MSMT LEADCHNL RV SENSING INTR AMPL: 19 mV
MDC IDC SET LEADCHNL RV PACING AMPLITUDE: 2.5 V
MDC IDC SET ZONE DETECTION INTERVAL: 350 ms
MDC IDC STAT BRADY AP VP PERCENT: 0.01 %
MDC IDC STAT BRADY AP VS PERCENT: 95.76 %
MDC IDC STAT BRADY AS VP PERCENT: 0 %
MDC IDC STAT BRADY AS VS PERCENT: 4.23 %
MDC IDC STAT BRADY RV PERCENT PACED: 0.01 %
Zone Setting Detection Interval: 400 ms

## 2013-12-19 NOTE — Telephone Encounter (Signed)
Follow up     Pt is calling back because she states no one has returned her call regarding her heart racing.  pls  advise

## 2013-12-19 NOTE — Telephone Encounter (Signed)
After discussing increasing Metoprolol (per Dr. Caryl Comes), pt tells me her BP has been running low - systolic in 35H occasionally. Will review again with Dr. Caryl Comes for alternatives. Pt agreeable to plan.

## 2013-12-20 DIAGNOSIS — I4891 Unspecified atrial fibrillation: Secondary | ICD-10-CM | POA: Diagnosis not present

## 2013-12-20 NOTE — Telephone Encounter (Signed)
Pt tells me she went to family doctor today - did ekg and bp check. She is going back on Friday for re-check, Metoprolol increased to 100 mg daily. She states at this time she is not interested in any intervention or follow up here. I will inform Dr. Caryl Comes.  Advised pt to call back if she has further issues/concerns she would like [place] to see her for related to this problem - she is agreeable.

## 2013-12-20 NOTE — Telephone Encounter (Signed)
Needs to have ECG with BP check  Perhaps to office in am

## 2013-12-22 DIAGNOSIS — I4891 Unspecified atrial fibrillation: Secondary | ICD-10-CM | POA: Diagnosis not present

## 2013-12-25 DIAGNOSIS — R609 Edema, unspecified: Secondary | ICD-10-CM | POA: Diagnosis not present

## 2013-12-25 DIAGNOSIS — I4891 Unspecified atrial fibrillation: Secondary | ICD-10-CM | POA: Diagnosis not present

## 2013-12-29 DIAGNOSIS — R609 Edema, unspecified: Secondary | ICD-10-CM | POA: Diagnosis not present

## 2013-12-29 DIAGNOSIS — Z7901 Long term (current) use of anticoagulants: Secondary | ICD-10-CM | POA: Diagnosis not present

## 2013-12-29 DIAGNOSIS — I4891 Unspecified atrial fibrillation: Secondary | ICD-10-CM | POA: Diagnosis not present

## 2013-12-29 DIAGNOSIS — I10 Essential (primary) hypertension: Secondary | ICD-10-CM | POA: Diagnosis not present

## 2014-01-03 ENCOUNTER — Encounter: Payer: Self-pay | Admitting: *Deleted

## 2014-01-05 ENCOUNTER — Encounter: Payer: Self-pay | Admitting: Internal Medicine

## 2014-01-05 DIAGNOSIS — I4891 Unspecified atrial fibrillation: Secondary | ICD-10-CM | POA: Diagnosis not present

## 2014-01-05 DIAGNOSIS — I10 Essential (primary) hypertension: Secondary | ICD-10-CM | POA: Diagnosis not present

## 2014-01-05 DIAGNOSIS — Z7901 Long term (current) use of anticoagulants: Secondary | ICD-10-CM | POA: Diagnosis not present

## 2014-01-05 DIAGNOSIS — R609 Edema, unspecified: Secondary | ICD-10-CM | POA: Diagnosis not present

## 2014-01-05 LAB — PROTIME-INR

## 2014-01-08 ENCOUNTER — Encounter: Payer: Self-pay | Admitting: Internal Medicine

## 2014-01-16 ENCOUNTER — Ambulatory Visit: Payer: Medicare Other | Admitting: Cardiology

## 2014-01-18 ENCOUNTER — Encounter: Payer: Self-pay | Admitting: Internal Medicine

## 2014-01-18 ENCOUNTER — Other Ambulatory Visit: Payer: Self-pay | Admitting: Internal Medicine

## 2014-01-18 ENCOUNTER — Telehealth: Payer: Self-pay | Admitting: *Deleted

## 2014-01-18 ENCOUNTER — Ambulatory Visit (INDEPENDENT_AMBULATORY_CARE_PROVIDER_SITE_OTHER): Payer: Medicare Other | Admitting: Internal Medicine

## 2014-01-18 VITALS — BP 121/82 | HR 107 | Ht 66.5 in | Wt 152.0 lb

## 2014-01-18 DIAGNOSIS — I4891 Unspecified atrial fibrillation: Secondary | ICD-10-CM

## 2014-01-18 LAB — MDC_IDC_ENUM_SESS_TYPE_INCLINIC
Battery Voltage: 2.99 V
Brady Statistic AP VP Percent: 0.04 %
Brady Statistic AS VS Percent: 18.59 %
Lead Channel Impedance Value: 440 Ohm
Lead Channel Setting Pacing Amplitude: 2 V
Lead Channel Setting Pacing Amplitude: 2.5 V
Lead Channel Setting Sensing Sensitivity: 0.9 mV
MDC IDC MSMT LEADCHNL RV IMPEDANCE VALUE: 608 Ohm
MDC IDC MSMT LEADCHNL RV SENSING INTR AMPL: 17.248 mV
MDC IDC SESS DTM: 20150219161945
MDC IDC SET LEADCHNL RV PACING PULSEWIDTH: 0.4 ms
MDC IDC SET ZONE DETECTION INTERVAL: 400 ms
MDC IDC STAT BRADY AP VS PERCENT: 80.49 %
MDC IDC STAT BRADY AS VP PERCENT: 0.88 %
MDC IDC STAT BRADY RA PERCENT PACED: 80.53 %
MDC IDC STAT BRADY RV PERCENT PACED: 0.92 %
Zone Setting Detection Interval: 350 ms

## 2014-01-18 LAB — PROTIME-INR
INR: 5.6 ratio (ref 0.8–1.0)
Prothrombin Time: 57.6 s (ref 10.2–12.4)

## 2014-01-18 MED ORDER — METOPROLOL SUCCINATE ER 100 MG PO TB24: 200.0000 mg | ORAL_TABLET | Freq: Every day | ORAL | Status: DC

## 2014-01-18 MED ORDER — FUROSEMIDE 40 MG PO TABS: 40.0000 mg | ORAL_TABLET | Freq: Every day | ORAL | Status: DC

## 2014-01-18 NOTE — Patient Instructions (Signed)
Your physician recommends that you return for lab work today for INR  Your physician has recommended you make the following change in your medication:  1) Increase Metoprolol to 100 mg daily 2) Start Lasix 40 mg daily  Your physician recommends that you return for lab work in: BMET in 1 week at appointment with Ileene Hutchinson, PA-C  Your physician recommends that you schedule a follow-up appointment in: 1 weeks with Winona Legato, PA-C for rate control

## 2014-01-18 NOTE — Telephone Encounter (Signed)
Lab called with Critical INR = 5.6 (drawn at 3:24 pm). Dr. Caryl Comes notified and he called patient with following Coumadin changes.  Patient is not to take Coumadin tonight. She is to take Coumadin 2.5 mg by mouth on Friday (2/20), Saturday (2/21), Sunday (2/22), Monday (2/23) and Tuesday (2/24). On Wednesday (2/25), she has appt at the Coumadin clinic at her PCP office. She can get lab drawn there and get further advisement regarding dosing. Dr. Caryl Comes called patient directly to discuss this with her and he states that she verbalized understanding and agreement with treatment plan.

## 2014-01-18 NOTE — Progress Notes (Signed)
Patient Care Team: Irven Shelling, MD as PCP - General (Internal Medicine)   HPI  Anne Macias is a 80 y.o. female Seen in followup for a pacemaker implanted for tachybradycardia syndrome. She has documented atrial fibrillation and is on warfarin.  She has had problems recently with recurrent tachypalpitations. She tried to call again but couldn't be seen. She went to see her PCP and she reverted spontaneously to sinus.  She had reverted back to atrial fibrillation and this has been further complicated by significant peripheral edema. There is mild shortness of breath.  Echocardiogram 2011 demonstrated normal LV systolic function   Past Medical History  Diagnosis Date  . Edema of lower extremity   . PAF (paroxysmal atrial fibrillation) 1998  . Hypertension   . Hyperlipidemia   . Visual disturbance   . Tachy-brady syndrome   . Chronic anticoagulation   . S/P cardiac pacemaker procedure Sept 2202    complicated by pocket hematoma    Past Surgical History  Procedure Laterality Date  . Insert / replace / remove pacemaker  08/28/2010    implantaton  . Cholecystectomy  2005  . Cataract extraction    . US echocardiography  04/10/2010    EF 55-60%    Current Outpatient Prescriptions  Medication Sig Dispense Refill  . Calcium Carbonate-Vitamin D (CALTRATE 600+D PO) Take 2 tablets by mouth daily.        Marland Kitchen docusate sodium (COLACE) 100 MG capsule Take 100 mg by mouth 2 (two) times daily.      Marland Kitchen losartan-hydrochlorothiazide (HYZAAR) 50-12.5 MG per tablet Take 1 tablet by mouth daily.      . metoprolol succinate (TOPROL-XL) 100 MG 24 hr tablet Take 100 mg by mouth daily. Take with or immediately following a meal.      . Multiple Vitamin (MULTIVITAMIN) tablet Take 1 tablet by mouth daily.        . simvastatin (ZOCOR) 40 MG tablet Take 40 mg by mouth every evening.        . warfarin (COUMADIN) 5 MG tablet Take 5 mg by mouth as directed.         No current  facility-administered medications for this visit.    No Known Allergies  Review of Systems negative except from HPI and PMH  Physical Exam BP 121/82  Pulse 107  Ht 5' 6.5" (1.689 m)  Wt 152 lb (68.947 kg)  BMI 24.17 kg/m2 Well developed and well nourished in no acute distress HENT normal E scleral and icterus clear Neck Supple JVP 8-9; carotids brisk and full Clear to ausculation  Regular rate and rhythm, no murmurs gallops or rub Soft with active bowel sounds No clubbing cyanosis 3+ Edema Alert and oriented, grossly normal motor and sensory function Skin Warm and Dry  ECG demonstrates atrial fibrillation at a rate of 109.  Assessment and  Plan  Atrial fibrillation with a rapid ventricular rate  Is having paroxysms of atrial fibrillation that are quite long lasting. It is not clear whether there is a primary underlying inciting issue; we'll try and get blood work from Dr. Laurann Montana. We have discussed at length strategies of rate control versus rhythm control. Her INR most recently unfortunately was subtherapeutic at 1.9. We have also elected to choose a strategy of rate control. We will begin to increase her metoprolol from 50--100 mg. We'll check her next week and if her mean heart rate is still over 95  We will further increase her metoprolol  if her blood pressure allows.  Plan to undertake cardioversion in 3-4 weeks if rate control is not sufficient for symptom control.  We will check her INR today.   Congestive heart failure-HFpEF  As above Furthermore, we will increase her diuretics by adding furosemide 40 mg daily daily HCTZ she is currently taking. We'll check a metabolic profile next week.  Tachybradycardia syndrome As above   Pacemaker-[employer]  The patient's device was interrogated.  The information was reviewed. No changes were made in the programming.

## 2014-01-24 ENCOUNTER — Ambulatory Visit (INDEPENDENT_AMBULATORY_CARE_PROVIDER_SITE_OTHER): Payer: Medicare Other | Admitting: *Deleted

## 2014-01-24 DIAGNOSIS — I10 Essential (primary) hypertension: Secondary | ICD-10-CM

## 2014-01-24 DIAGNOSIS — Z8679 Personal history of other diseases of the circulatory system: Secondary | ICD-10-CM | POA: Diagnosis not present

## 2014-01-24 DIAGNOSIS — I4891 Unspecified atrial fibrillation: Secondary | ICD-10-CM | POA: Diagnosis not present

## 2014-01-24 DIAGNOSIS — Z7901 Long term (current) use of anticoagulants: Secondary | ICD-10-CM | POA: Diagnosis not present

## 2014-01-24 LAB — BASIC METABOLIC PANEL
BUN: 25 mg/dL — AB (ref 6–23)
CO2: 32 mEq/L (ref 19–32)
CREATININE: 1.2 mg/dL (ref 0.4–1.2)
Calcium: 8.7 mg/dL (ref 8.4–10.5)
Chloride: 97 mEq/L (ref 96–112)
GFR: 45.85 mL/min — AB (ref >60.00)
GLUCOSE: 141 mg/dL — AB (ref 70–99)
POTASSIUM: 3.7 meq/L (ref 3.5–5.1)
Sodium: 138 mEq/L (ref 135–145)

## 2014-01-25 ENCOUNTER — Other Ambulatory Visit: Payer: Medicare Other

## 2014-01-27 ENCOUNTER — Telehealth: Payer: Self-pay | Admitting: Internal Medicine

## 2014-01-27 NOTE — Telephone Encounter (Signed)
I was contacted by phone by Ms. Brosious regarding back pain that began earlier today while she was standing in the bathroom.  She reports that it felt like a cramp extending from her shoulders to her hips.  The pain resolved when she sat down but has intermittently returned when she stands up.  She is concerned that it could be related to recent medication changes and low potassium.  She denies chest pain and shortness of breath.  I advised her that the pain is most likely musculoskeletal in nature; she could try OTC acetaminophen.  If it persists or worsens, she should seek medical attention at an urgent care or the ED.  Of note her K was 3.7 3 days ago.

## 2014-01-31 ENCOUNTER — Encounter: Payer: Self-pay | Admitting: Cardiology

## 2014-01-31 ENCOUNTER — Ambulatory Visit (INDEPENDENT_AMBULATORY_CARE_PROVIDER_SITE_OTHER): Payer: Medicare Other | Admitting: Cardiology

## 2014-01-31 VITALS — BP 118/72 | HR 106 | Ht 66.5 in | Wt 152.0 lb

## 2014-01-31 DIAGNOSIS — Z7901 Long term (current) use of anticoagulants: Secondary | ICD-10-CM | POA: Diagnosis not present

## 2014-01-31 DIAGNOSIS — Z950 Presence of cardiac pacemaker: Secondary | ICD-10-CM | POA: Diagnosis not present

## 2014-01-31 DIAGNOSIS — I4891 Unspecified atrial fibrillation: Secondary | ICD-10-CM

## 2014-01-31 LAB — BASIC METABOLIC PANEL
BUN: 33 mg/dL — AB (ref 6–23)
CALCIUM: 8.8 mg/dL (ref 8.4–10.5)
CO2: 33 meq/L — AB (ref 19–32)
Chloride: 97 mEq/L (ref 96–112)
Creatinine, Ser: 1.3 mg/dL — ABNORMAL HIGH (ref 0.4–1.2)
GFR: 42.9 mL/min — AB (ref >60.00)
GLUCOSE: 106 mg/dL — AB (ref 70–99)
POTASSIUM: 3.5 meq/L (ref 3.5–5.1)
Sodium: 137 mEq/L (ref 135–145)

## 2014-01-31 NOTE — Patient Instructions (Addendum)
Your physician recommends that you schedule a follow-up appointment in: [place]  Your physician recommends that you continue on your current medications as directed. Please refer to the Current Medication list given to you today.  Your physician recommends that you return for lab work in: [place]

## 2014-01-31 NOTE — Progress Notes (Addendum)
ELECTROPHYSIOLOGY OFFICE NOTE   Patient ID: Anne Macias MRN: [id number], DOB/AGE: 1923-08-10   Date of Visit: 01/31/2014  Primary Physician: Irven Shelling, MD Primary Cardiologist: Jolyn Nap, MD Reason for Visit: Atrial fibrillation  History of Present Illness  Shantasia Hunnell is a 80 y.o. female tachy-brady syndrome s/p PPM implant and atrial fibrillation who presents today for 2-week electrophysiology followup. She saw Dr. Caryl Comes on 01/18/2014 at which time she was found to have persistent atrial fibrillation with elevated V rates. He increased metoprolol for rate control and added Lasix for LE swelling. She is here with her husband for follow-up today.   Since last being seen in our clinic, she reports her symptoms are improved but still present. She notices if she "gets in a hurry" she has dyspnea with walking. She has occasional palpitations. She denies CP. She denies dizziness, near syncope or syncope. Her LE swelling is better. She denies orthopnea or PND. She is compliant with medications. She is keeping a BP log at home and her SBP runs 98-110 consistently. Of note, Dr. Caryl Comes requested labs from Dr. Delene Ruffini office, ie - CBC and TSH; however, I do not have these records at this time.  Past Medical History Past Medical History  Diagnosis Date  . Edema of lower extremity   . PAF (paroxysmal atrial fibrillation) 1998  . Hypertension   . Hyperlipidemia   . Visual disturbance   . Tachy-brady syndrome   . Chronic anticoagulation   . S/P cardiac pacemaker procedure Sept 6160    complicated by pocket hematoma    Past Surgical History Past Surgical History  Procedure Laterality Date  . Insert / replace / remove pacemaker  08/28/2010    implantaton  . Cholecystectomy  2005  . Cataract extraction    . US echocardiography  04/10/2010    EF 55-60%    Allergies/Intolerances No Known Allergies  Current Home Medications Current Outpatient Prescriptions  Medication Sig  Dispense Refill  . Calcium Carbonate-Vitamin D (CALTRATE 600+D PO) Take 2 tablets by mouth daily.        Marland Kitchen docusate sodium (COLACE) 100 MG capsule Take 100 mg by mouth 2 (two) times daily.      . furosemide (LASIX) 40 MG tablet Take 1 tablet (40 mg total) by mouth daily.  30 tablet  3  . losartan-hydrochlorothiazide (HYZAAR) 50-12.5 MG per tablet Take 1 tablet by mouth daily.      . metoprolol succinate (TOPROL-XL) 100 MG 24 hr tablet Take 2 tablets (200 mg total) by mouth daily. Take with or immediately following a meal.  60 tablet  2  . Multiple Vitamin (MULTIVITAMIN) tablet Take 1 tablet by mouth daily.        . simvastatin (ZOCOR) 40 MG tablet Take 40 mg by mouth every evening.        . warfarin (COUMADIN) 5 MG tablet Take 5 mg by mouth as directed.         No current facility-administered medications for this visit.    Social History History   Social History  . Marital Status: Married    Spouse Name: N/A    Number of Children: N/A  . Years of Education: N/A   Occupational History  . Not on file.   Social History Main Topics  . Smoking status: Never Smoker   . Smokeless tobacco: Not on file  . Alcohol Use: No  . Drug Use: No  . Sexual Activity:    Other Topics Concern  . Not  on file   Social History Narrative  . No narrative on file     Review of Systems General: No chills, fever, night sweats or weight changes Cardiovascular: No chest pain, dyspnea on exertion, edema, orthopnea, palpitations, paroxysmal nocturnal dyspnea Dermatological: No rash, lesions or masses Respiratory: No cough, dyspnea Urologic: No hematuria, dysuria Abdominal: No nausea, vomiting, diarrhea, bright red blood per rectum, melena, or hematemesis Neurologic: No visual changes, weakness, changes in mental status All other systems reviewed and are otherwise negative except as noted above.  Physical Exam Vitals: Blood pressure 118/72, pulse 106, height 5' 6.5" (1.689 m), weight 152 lb (68.947  kg).  General: Well developed, well appearing 80 y.o. female in no acute distress. HEENT: Normocephalic, atraumatic. EOMs intact. Sclera nonicteric. Oropharynx clear.  Neck: Supple. No JVD. Lungs: Respirations regular and unlabored, CTA bilaterally. No wheezes, rales or rhonchi. Heart: Irregular. S1, S2 present. No murmurs, rub, S3 or S4. Abdomen: Soft, non-distended.  Extremities: No clubbing or cyanosis. 1+ pedal edema bilaterally. PT/Radials 2+ and equal bilaterally. Psych: Normal affect. Neuro: Alert and oriented X 3. Moves all extremities spontaneously.   Diagnostics 12-lead ECG today - atrial fibrillation at 102 bpm with RBBB Device interrogation today - Quick look for rate control in AFib. Histograms remain right shifted indicating inadequate rate control with 55% of beats >100 bpm. Patient would like to avoid DCCV if possible. Discussed with Dr. Caryl Comes and will adjust meds for rate control.  Assessment and Plan 1. Atrial fibrillation w/ RVR - symptoms improved since she was last seen by Dr. Caryl Comes; however, her rate control is inadequate with her current regimen - reviewed BP log and SBP 98-110 consistently - will check BMET today and adjust Lasix accordingly - Ms. Gambrell states she would like to continue medical therapy with rate control and avoid DCCV if possible - will confer with Dr. Caryl Comes regarding rate control  Signed, Ileene Hutchinson, PA-C 01/31/2014, 2:49 PM  ADDENDUM: Discussed with Dr. Caryl Comes and reviewed device interrogation with him. Will make medication adjustments as follows:  1) Stop losartan-HCT to allow for up-titration of rate controlling meds 2) Decrease metoprolol succinate to 100 mg once daily 3) Add diltiazem CD 120 mg once daily 4) Return for follow-up in one week

## 2014-02-06 LAB — MDC_IDC_ENUM_SESS_TYPE_INCLINIC
Brady Statistic AP VS Percent: 0.02 %
Brady Statistic AS VP Percent: 0.81 %
Brady Statistic RA Percent Paced: 0.11 %
Brady Statistic RV Percent Paced: 0.9 %
Date Time Interrogation Session: 20150304195100
Lead Channel Impedance Value: 448 Ohm
Lead Channel Setting Pacing Amplitude: 2 V
Lead Channel Setting Pacing Amplitude: 2.5 V
Lead Channel Setting Pacing Pulse Width: 0.4 ms
Lead Channel Setting Sensing Sensitivity: 0.9 mV
MDC IDC MSMT BATTERY VOLTAGE: 2.97 V
MDC IDC MSMT LEADCHNL RV IMPEDANCE VALUE: 608 Ohm
MDC IDC STAT BRADY AP VP PERCENT: 0.09 %
MDC IDC STAT BRADY AS VS PERCENT: 99.09 %
Zone Setting Detection Interval: 350 ms
Zone Setting Detection Interval: 400 ms

## 2014-02-06 MED ORDER — DILTIAZEM HCL ER COATED BEADS 120 MG PO CP24: 120.0000 mg | ORAL_CAPSULE | Freq: Every day | ORAL | Status: DC

## 2014-02-06 MED ORDER — METOPROLOL SUCCINATE ER 100 MG PO TB24: 100.0000 mg | ORAL_TABLET | Freq: Every day | ORAL | Status: DC

## 2014-02-06 NOTE — Addendum Note (Signed)
Addended by: Andrez Grime on: 02/06/2014 06:30 PM   Modules accepted: Orders, Medications

## 2014-02-07 ENCOUNTER — Telehealth: Payer: Self-pay | Admitting: Internal Medicine

## 2014-02-07 ENCOUNTER — Other Ambulatory Visit: Payer: Self-pay | Admitting: *Deleted

## 2014-02-07 MED ORDER — VERAPAMIL HCL ER 120 MG PO TBCR: 120.0000 mg | EXTENDED_RELEASE_TABLET | Freq: Every day | ORAL | Status: DC

## 2014-02-07 NOTE — Telephone Encounter (Signed)
Advised pt to stop Diltiazem, and start Verapamil. Sent to [facility], requested pharmacy. Pt very thankful for our help and agreeable to plan.

## 2014-02-07 NOTE — Telephone Encounter (Signed)
New Message:  Pt  states  diltiazem is the medicine that her family physician thought caused the excessive swelling.  Pt states she would like to speak to the nurse. She wants to know should she take it.

## 2014-02-09 ENCOUNTER — Other Ambulatory Visit: Payer: Self-pay | Admitting: *Deleted

## 2014-02-09 ENCOUNTER — Encounter: Payer: Self-pay | Admitting: Cardiology

## 2014-02-09 ENCOUNTER — Telehealth: Payer: Self-pay | Admitting: Cardiology

## 2014-02-09 NOTE — Telephone Encounter (Signed)
Losartan was dc'd in order to increase other medications to improve rate control. Then, after recent labs, lasix was changed to daily prn for LE edema (per result note recording by [employer]) Pt understood that she was to STOP lasix.  Informed her of the PRN note and instructed her to take a lasix 40 mg today, since her weight is up six lbs and she does have some LE edema. Also instructed her to take one tomorrow if still has edema and weight is 151 or greater. Educated and instructed on eating extra potassium rich foods.  Examples provided. Patient has follow up appointment with PA on 3/17. Pt verbalizes understanding and agreement.

## 2014-02-09 NOTE — Telephone Encounter (Signed)
Reporting     Pt stated she has gained weight 148 to 154 lb with in the last for days, BP is good 1117/66 hr 77.  Pt has a question about med she should take between now and her appt 3/17.  Pt would like to take an HCTZ pill?

## 2014-02-13 ENCOUNTER — Encounter: Payer: Self-pay | Admitting: Cardiology

## 2014-02-13 ENCOUNTER — Other Ambulatory Visit: Payer: Self-pay | Admitting: *Deleted

## 2014-02-13 ENCOUNTER — Ambulatory Visit (INDEPENDENT_AMBULATORY_CARE_PROVIDER_SITE_OTHER): Payer: Medicare Other | Admitting: Cardiology

## 2014-02-13 VITALS — BP 115/70 | HR 105 | Ht 66.0 in | Wt 158.0 lb

## 2014-02-13 DIAGNOSIS — Z950 Presence of cardiac pacemaker: Secondary | ICD-10-CM | POA: Diagnosis not present

## 2014-02-13 DIAGNOSIS — I495 Sick sinus syndrome: Secondary | ICD-10-CM | POA: Diagnosis not present

## 2014-02-13 DIAGNOSIS — I4891 Unspecified atrial fibrillation: Secondary | ICD-10-CM | POA: Diagnosis not present

## 2014-02-13 LAB — MDC_IDC_ENUM_SESS_TYPE_INCLINIC
Brady Statistic AP VS Percent: 0.03 %
Brady Statistic AS VS Percent: 97.2 %
Lead Channel Impedance Value: 448 Ohm
Lead Channel Impedance Value: 600 Ohm
MDC IDC MSMT BATTERY VOLTAGE: 2.99 V
MDC IDC MSMT LEADCHNL RA SENSING INTR AMPL: 1.7535
MDC IDC MSMT LEADCHNL RV SENSING INTR AMPL: 14.1434
MDC IDC SESS DTM: 20150317145452
MDC IDC SET LEADCHNL RA PACING AMPLITUDE: 2 V
MDC IDC SET LEADCHNL RV PACING AMPLITUDE: 2.5 V
MDC IDC SET LEADCHNL RV PACING PULSEWIDTH: 0.4 ms
MDC IDC SET LEADCHNL RV SENSING SENSITIVITY: 0.9 mV
MDC IDC SET ZONE DETECTION INTERVAL: 350 ms
MDC IDC STAT BRADY AP VP PERCENT: 0.15 %
MDC IDC STAT BRADY AS VP PERCENT: 2.62 %
MDC IDC STAT BRADY RA PERCENT PACED: 0.18 %
MDC IDC STAT BRADY RV PERCENT PACED: 2.77 %
Zone Setting Detection Interval: 400 ms

## 2014-02-13 LAB — CBC WITH DIFFERENTIAL/PLATELET
Basophils Absolute: 0 10*3/uL (ref 0.0–0.1)
Basophils Relative: 0.4 % (ref 0.0–3.0)
EOS PCT: 0.8 % (ref 0.0–5.0)
Eosinophils Absolute: 0.1 10*3/uL (ref 0.0–0.7)
HCT: 39.2 % (ref 36.0–46.0)
HEMOGLOBIN: 12.8 g/dL (ref 12.0–15.0)
Lymphocytes Relative: 34.4 % (ref 12.0–46.0)
Lymphs Abs: 2.6 10*3/uL (ref 0.7–4.0)
MCHC: 32.7 g/dL (ref 30.0–36.0)
MCV: 88.5 fl (ref 78.0–100.0)
MONOS PCT: 10.4 % (ref 3.0–12.0)
Monocytes Absolute: 0.8 10*3/uL (ref 0.1–1.0)
Neutro Abs: 4.1 10*3/uL (ref 1.4–7.7)
Neutrophils Relative %: 54 % (ref 43.0–77.0)
Platelets: 234 10*3/uL (ref 150.0–400.0)
RBC: 4.43 Mil/uL (ref 3.87–5.11)
RDW: 13.4 % (ref 11.5–14.6)
WBC: 7.5 10*3/uL (ref 4.5–10.5)

## 2014-02-13 LAB — BASIC METABOLIC PANEL
BUN: 25 mg/dL — AB (ref 6–23)
CO2: 29 mEq/L (ref 19–32)
Calcium: 8.7 mg/dL (ref 8.4–10.5)
Chloride: 98 mEq/L (ref 96–112)
Creatinine, Ser: 1.1 mg/dL (ref 0.4–1.2)
GFR: 48.69 mL/min — ABNORMAL LOW (ref >60.00)
Glucose, Bld: 100 mg/dL — ABNORMAL HIGH (ref 70–99)
Potassium: 4.2 mEq/L (ref 3.5–5.1)
Sodium: 135 mEq/L (ref 135–145)

## 2014-02-13 LAB — TSH: TSH: 1.1 u[IU]/mL (ref 0.35–5.50)

## 2014-02-13 MED ORDER — POTASSIUM CHLORIDE CRYS ER 20 MEQ PO TBCR: 20.0000 meq | EXTENDED_RELEASE_TABLET | Freq: Every day | ORAL | Status: DC

## 2014-02-13 NOTE — Progress Notes (Signed)
ELECTROPHYSIOLOGY OFFICE NOTE   Patient ID: Anne Macias MRN: [id number], DOB/AGE: June 12, 1923   Date of Visit: 02/13/2014  Primary Physician: Lavone Orn, MD Primary Cardiologist: Jolyn Nap, MD Reason for Visit: Atrial fibrillation  History of Present Illnes Anne Macias is a 80 y.o. female with tachy-brady syndrome s/p PPM implant and atrial fibrillation who presents today for electrophysiology follow-up after recent medication adjustments. She saw Dr. Caryl Comes on 01/18/2014 at which time she was found to have persistent atrial fibrillation with elevated V rates. He increased metoprolol for rate control and added Lasix for LE swelling. She was then seen by me in follow-up 2 weeks later on 01/31/2014. Her V rate was still poorly controlled. She prefers conservative management and elected to continue medical therapy for now, hoping to avoid DCCV or procedural treatment options. Her BP was low so we discontinued Losartan to allow for up-titration of rate controlling meds. Diltiazem was added. This was started 02/06/2014. She noticed increased LE swelling with diltiazem and this was changed to Verapamil. She presents today for 2 week follow-up.  Since last being seen in our clinic, she reports her symptoms are improved but still present. She notices if she "gets in a hurry" she has dyspnea with walking. She and her husband walk slowly every day "strolling" and she is able to walk continuously for 15 minutes at a time before stopping to catch her breath. She has occasional palpitations, usually when lying down to sleep at night, lasting only 3 minutes. She denies CP. She denies dizziness, near syncope or syncope. Her LE swelling is better but still present. She has noticed weight gain. She denies orthopnea or PND. She is compliant with medications. She is keeping a BP log at home and her SBP runs 100-117 consistently. Of note, Dr. Caryl Comes requested labs from Dr. Delene Ruffini office, ie - CBC and TSH;  however, I do not have these records at this time.  Past Medical History Past Medical History  Diagnosis Date  . Edema of lower extremity   . PAF (paroxysmal atrial fibrillation) 1998  . Hypertension   . Hyperlipidemia   . Visual disturbance   . Tachy-brady syndrome   . Chronic anticoagulation   . S/P cardiac pacemaker procedure Sept 2423    complicated by pocket hematoma    Past Surgical History Past Surgical History  Procedure Laterality Date  . Insert / replace / remove pacemaker  08/28/2010    implantaton  . Cholecystectomy  2005  . Cataract extraction    . US echocardiography  04/10/2010    EF 55-60%    Allergies/Intolerances No Known Allergies  Current Home Medications Current Outpatient Prescriptions  Medication Sig Dispense Refill  . Calcium Carbonate-Vitamin D (CALTRATE 600+D PO) Take 2 tablets by mouth daily.        Marland Kitchen docusate sodium (COLACE) 100 MG capsule Take 100 mg by mouth 2 (two) times daily.      . furosemide (LASIX) 40 MG tablet Take 40 mg by mouth daily. Daily PRN for LE swelling      . metoprolol succinate (TOPROL-XL) 100 MG 24 hr tablet Take 1 tablet (100 mg total) by mouth daily. Take with or immediately following a meal.  60 tablet  2  . Multiple Vitamin (MULTIVITAMIN) tablet Take 1 tablet by mouth daily.        . simvastatin (ZOCOR) 40 MG tablet Take 40 mg by mouth every evening.        . verapamil (CALAN-SR) 120 MG CR tablet  Take 1 tablet (120 mg total) by mouth at bedtime.  30 tablet  3  . warfarin (COUMADIN) 5 MG tablet Take 5 mg by mouth as directed.         No current facility-administered medications for this visit.    Social History History   Social History  . Marital Status: Married    Spouse Name: N/A    Number of Children: N/A  . Years of Education: N/A   Occupational History  . Not on file.   Social History Main Topics  . Smoking status: Never Smoker   . Smokeless tobacco: Not on file  . Alcohol Use: No  . Drug Use: No  .  Sexual Activity:    Other Topics Concern  . Not on file   Social History Narrative  . No narrative on file     Review of Systems General: No chills, fever, night sweats or weight changes Cardiovascular: No chest pain, dyspnea on exertion, edema, orthopnea, palpitations, paroxysmal nocturnal dyspnea Dermatological: No rash, lesions or masses Respiratory: No cough, dyspnea Urologic: No hematuria, dysuria Abdominal: No nausea, vomiting, diarrhea, bright red blood per rectum, melena, or hematemesis Neurologic: No visual changes, weakness, changes in mental status All other systems reviewed and are otherwise negative except as noted above.  Physical Exam Vitals: Blood pressure 115/70, pulse 105, height 5\' 6"  (1.676 m), weight 158 lb (71.668 kg).  General: Well developed, well appearing 80 y.o. female in no acute distress. HEENT: Normocephalic, atraumatic. EOMs intact. Sclera nonicteric. Oropharynx clear.  Neck: Supple. No JVD. Lungs: Respirations regular and unlabored, CTA bilaterally. No wheezes, rales or rhonchi. Heart: Irregular. S1, S2 present. No murmurs, rub, S3 or S4. Abdomen: Soft, non-tender, non-distended. BS present x 4 quadrants. No hepatosplenomegaly.  Extremities: No clubbing, cyanosis or edema. DP/PT/Radials 2+ and equal bilaterally. Psych: Normal affect. Neuro: Alert and oriented X 3. Moves all extremities spontaneously.   Diagnostics 12-lead ECG today - atrial fibrillation w/RVR at 105 bpm, incomplete RBBB Device interrogation - Quick look for rate control in AFib. Histograms remain right shifted indicating inadequate rate control with 55% of beats >100 bpm.   Assessment and Plan 1. Atrial fibrillation w/ RVR  - symptoms improved since she was last seen by Dr. Caryl Comes; however, her rate control is inadequate with her current regimen  - reviewed BP log and SBP 100-117 consistently  - will check CBC, BMET and TSH today  - resume Lasix daily with potassium  supplementation - discussed goals of care to treat symptoms, prevent stroke and prevent tachy-mediated cardiomyopathy; treatment options including continued medical therapy +/- amiodarone then DCCV versus AV node ablation; Ms. Genco states she would like to continue medical therapy with rate control and avoid procedural treatments if possible  - reviewed with Dr. Caryl Comes - will check echo and labs (CBC, BMET and TSH) - return for follow-up in 1 week  Signed, Jonatan Wilsey, PA-C 02/13/2014, 2:16 PM

## 2014-02-13 NOTE — Patient Instructions (Addendum)
Your physician has requested that you have an echocardiogram. Echocardiography is a painless test that uses sound waves to create images of your heart. It provides your doctor with information about the size and shape of your heart and how well your heart's chambers and valves are working. This procedure takes approximately one hour. There are no restrictions for this procedure.  Your physician recommends that you resume Lasix 40 mg once daily with potassium supplementation (20 mEq once daily). Continue metoprolol succinate 100 mg once daily and Verapamil 120 mg once daily as directed.    Your physician recommends that you schedule a follow-up appointment in: 1-2 weeks with Dr. Caryl Comes.

## 2014-02-14 ENCOUNTER — Encounter: Payer: Self-pay | Admitting: Cardiology

## 2014-02-16 ENCOUNTER — Ambulatory Visit (HOSPITAL_COMMUNITY): Payer: Medicare Other | Attending: Cardiology | Admitting: Cardiology

## 2014-02-16 DIAGNOSIS — I4891 Unspecified atrial fibrillation: Secondary | ICD-10-CM | POA: Diagnosis not present

## 2014-02-16 DIAGNOSIS — I495 Sick sinus syndrome: Secondary | ICD-10-CM

## 2014-02-16 DIAGNOSIS — Z950 Presence of cardiac pacemaker: Secondary | ICD-10-CM | POA: Insufficient documentation

## 2014-02-16 NOTE — Progress Notes (Signed)
Echo performed. 

## 2014-02-20 ENCOUNTER — Encounter: Payer: Self-pay | Admitting: Internal Medicine

## 2014-02-20 ENCOUNTER — Ambulatory Visit (INDEPENDENT_AMBULATORY_CARE_PROVIDER_SITE_OTHER): Payer: Medicare Other | Admitting: Internal Medicine

## 2014-02-20 ENCOUNTER — Encounter: Payer: Self-pay | Admitting: *Deleted

## 2014-02-20 VITALS — BP 141/96 | HR 119 | Ht 65.0 in | Wt 159.0 lb

## 2014-02-20 DIAGNOSIS — I4891 Unspecified atrial fibrillation: Secondary | ICD-10-CM | POA: Diagnosis not present

## 2014-02-20 DIAGNOSIS — Z950 Presence of cardiac pacemaker: Secondary | ICD-10-CM | POA: Diagnosis not present

## 2014-02-20 DIAGNOSIS — Z01812 Encounter for preprocedural laboratory examination: Secondary | ICD-10-CM | POA: Diagnosis not present

## 2014-02-20 DIAGNOSIS — I495 Sick sinus syndrome: Secondary | ICD-10-CM | POA: Diagnosis not present

## 2014-02-20 LAB — MDC_IDC_ENUM_SESS_TYPE_INCLINIC
Battery Voltage: 2.99 V
Brady Statistic AP VP Percent: 0.15 %
Brady Statistic AP VS Percent: 0.03 %
Brady Statistic AS VP Percent: 2.17 %
Brady Statistic RA Percent Paced: 0.18 %
Date Time Interrogation Session: 20150324145431
Lead Channel Impedance Value: 496 Ohm
Lead Channel Setting Pacing Amplitude: 2 V
Lead Channel Setting Pacing Pulse Width: 0.4 ms
Lead Channel Setting Sensing Sensitivity: 0.9 mV
MDC IDC MSMT LEADCHNL RA SENSING INTR AMPL: 0.8126
MDC IDC MSMT LEADCHNL RV IMPEDANCE VALUE: 632 Ohm
MDC IDC MSMT LEADCHNL RV PACING THRESHOLD AMPLITUDE: 1 V
MDC IDC MSMT LEADCHNL RV PACING THRESHOLD PULSEWIDTH: 0.4 ms
MDC IDC MSMT LEADCHNL RV SENSING INTR AMPL: 13.4534
MDC IDC SET LEADCHNL RV PACING AMPLITUDE: 2.5 V
MDC IDC STAT BRADY AS VS PERCENT: 97.65 %
MDC IDC STAT BRADY RV PERCENT PACED: 2.32 %
Zone Setting Detection Interval: 350 ms
Zone Setting Detection Interval: 400 ms

## 2014-02-20 LAB — BASIC METABOLIC PANEL
BUN: 25 mg/dL — AB (ref 6–23)
CO2: 30 mEq/L (ref 19–32)
Calcium: 8.6 mg/dL (ref 8.4–10.5)
Chloride: 99 mEq/L (ref 96–112)
Creatinine, Ser: 1.2 mg/dL (ref 0.4–1.2)
GFR: 46.3 mL/min — AB (ref >60.00)
Glucose, Bld: 106 mg/dL — ABNORMAL HIGH (ref 70–99)
POTASSIUM: 4.2 meq/L (ref 3.5–5.1)
SODIUM: 137 meq/L (ref 135–145)

## 2014-02-20 LAB — CBC WITH DIFFERENTIAL/PLATELET
BASOS ABS: 0 10*3/uL (ref 0.0–0.1)
Basophils Relative: 0.4 % (ref 0.0–3.0)
Eosinophils Absolute: 0 10*3/uL (ref 0.0–0.7)
Eosinophils Relative: 0.6 % (ref 0.0–5.0)
HCT: 40 % (ref 36.0–46.0)
Hemoglobin: 12.9 g/dL (ref 12.0–15.0)
LYMPHS PCT: 34.1 % (ref 12.0–46.0)
Lymphs Abs: 2.6 10*3/uL (ref 0.7–4.0)
MCHC: 32.2 g/dL (ref 30.0–36.0)
MCV: 88.2 fl (ref 78.0–100.0)
MONOS PCT: 10.1 % (ref 3.0–12.0)
Monocytes Absolute: 0.8 10*3/uL (ref 0.1–1.0)
NEUTROS PCT: 54.8 % (ref 43.0–77.0)
Neutro Abs: 4.1 10*3/uL (ref 1.4–7.7)
Platelets: 221 10*3/uL (ref 150.0–400.0)
RBC: 4.53 Mil/uL (ref 3.87–5.11)
RDW: 13.3 % (ref 11.5–14.6)
WBC: 7.5 10*3/uL (ref 4.5–10.5)

## 2014-02-20 LAB — PROTIME-INR
INR: 3.5 ratio — AB (ref 0.8–1.0)
Prothrombin Time: 35.8 s — ABNORMAL HIGH (ref 10.2–12.4)

## 2014-02-20 MED ORDER — POTASSIUM CHLORIDE CRYS ER 20 MEQ PO TBCR: 20.0000 meq | EXTENDED_RELEASE_TABLET | Freq: Two times a day (BID) | ORAL | Status: DC

## 2014-02-20 MED ORDER — FUROSEMIDE 40 MG PO TABS: 40.0000 mg | ORAL_TABLET | Freq: Two times a day (BID) | ORAL | Status: DC

## 2014-02-20 MED ORDER — FLECAINIDE ACETATE 100 MG PO TABS: 100.0000 mg | ORAL_TABLET | Freq: Two times a day (BID) | ORAL | Status: DC

## 2014-02-20 MED ORDER — METOPROLOL SUCCINATE ER 100 MG PO TB24: 100.0000 mg | ORAL_TABLET | Freq: Two times a day (BID) | ORAL | Status: DC

## 2014-02-20 NOTE — Patient Instructions (Addendum)
Your physician has recommended you make the following change in your medication:  1) Stop Verapamil 2) Increase Furosemide to 40 mg twice daily 3) Increase Metoprolol Succinate to 100 mg twice daily 4) Increase Potassium 20 mEq twice daily 5) Start Flecainide 100 mg twice daily  Your physician has recommended that you have a Cardioversion (DCCV). Electrical Cardioversion uses a jolt of electricity to your heart either through paddles or wired patches attached to your chest. This is a controlled, usually prescheduled, procedure. Defibrillation is done under light anesthesia in the hospital, and you usually go home the day of the procedure. This is done to get your heart back into a normal rhythm. You are not awake for the procedure. Please see the instruction sheet given to you today.  Scheduled for 02/26/14 at 1pm, be at hospital at [facility] recommends that you return for pre procedure lab work today: BMET, CBCD, INR  Your physician recommends that you schedule a follow-up appointment in: 3 weeks with Dr. Caryl Comes (post cardioversion procedure)

## 2014-02-20 NOTE — Progress Notes (Signed)
Patient Care Team: Irven Shelling, MD as PCP - General (Internal Medicine)   HPI  Anne Macias is a 80 y.o. female Seen in followup for a pacemaker implanted for tachybradycardia syndrome. She has documented atrial fibrillation and is on warfarin.  She has had problems recently with recurrent tachypalpitations. She tried to call again but couldn't be seen. She went to see her PCP and she reverted spontaneously to sinus.  She had reverted back to atrial fibrillation and this has been further complicated by significant peripheral edema. There is mild shortness of breath. She is also noted to have rapid ventricular rates. She is seen BE intercurrently for augmenting rate control. Low blood pressure prompted the discontinuation of losartan. Edema complicated use of diltiazem and so she was tried on verapamil. She continues to have dyspnea on exertion and worsening edema  She also has severe constipation   Echocardiogram 2011 demonstrated normal LV systolic function    Past Medical History  Diagnosis Date  . Edema of lower extremity   . PAF (paroxysmal atrial fibrillation) 1998  . Hypertension   . Hyperlipidemia   . Visual disturbance   . Tachy-brady syndrome   . Chronic anticoagulation   . S/P cardiac pacemaker procedure Sept 9562    complicated by pocket hematoma    Past Surgical History  Procedure Laterality Date  . Insert / replace / remove pacemaker  08/28/2010    implantaton  . Cholecystectomy  2005  . Cataract extraction    . US echocardiography  04/10/2010    EF 55-60%    Current Outpatient Prescriptions  Medication Sig Dispense Refill  . Calcium Carbonate-Vitamin D (CALTRATE 600+D PO) Take 2 tablets by mouth daily.        Marland Kitchen docusate sodium (COLACE) 100 MG capsule Take 100 mg by mouth 2 (two) times daily.      . furosemide (LASIX) 40 MG tablet Take 40 mg by mouth daily.       . metoprolol succinate (TOPROL-XL) 100 MG 24 hr tablet Take 1 tablet (100 mg  total) by mouth daily. Take with or immediately following a meal.  60 tablet  2  . Multiple Vitamin (MULTIVITAMIN) tablet Take 1 tablet by mouth daily.        . potassium chloride SA (K-DUR,KLOR-CON) 20 MEQ tablet Take 1 tablet (20 mEq total) by mouth daily.  30 tablet  1  . simvastatin (ZOCOR) 40 MG tablet Take 40 mg by mouth every evening.        . verapamil (CALAN-SR) 120 MG CR tablet Take 1 tablet (120 mg total) by mouth at bedtime.  30 tablet  3  . warfarin (COUMADIN) 5 MG tablet Take 5 mg by mouth as directed.         No current facility-administered medications for this visit.    No Known Allergies  Review of Systems negative except from HPI and PMH  Physical Exam BP 141/96  Pulse 119  Ht 5\' 5"  (1.651 m)  Wt 159 lb (72.122 kg)  BMI 26.46 kg/m2 Well developed and well nourished in no acute distress HENT normal E scleral and icterus clear Neck Supple JVP flat; carotids brisk and full Clear to ausculation  Regular rate and rhythm, no murmurs gallops or rub Soft with active bowel sounds No clubbing cyanosis 3+ Edema Alert and oriented, grossly normal motor and sensory function Skin Warm and Dry   ecg AFib 119  Assessment and  Plan  HFpEF  Atrial  fibrillation-rapid ventricular response  Pacemaker  Constipation  The patient has persistent problems with heart failure and we have been unable to control her rate with medicinal therapy.  We will undertake cardioversion. We have discussed the role of antiarrhythmic drugs and the possibility of pro arrhythmia. We'll start on flecainide 100 mg bid   and anticipate cardioversion about 3 days later. She will need subsequent treadmill testing.  We will discontinue the verapamil this time because of side effects increase her beta blocker.  With her volume overload we will increase her Lasix to twice daily as well as her potassium. We'll need to do a metabolic profile.

## 2014-02-22 ENCOUNTER — Telehealth: Payer: Self-pay | Admitting: Internal Medicine

## 2014-02-22 NOTE — Telephone Encounter (Signed)
New message     Talk about procedure she is having on monday

## 2014-02-22 NOTE — Telephone Encounter (Signed)
Pt calls in telling me she is feeling somewhat better, but still weak. She c/o being on the verge of dizziness on and off throughout the day and wonders about medications Reports recent BPs 120/75, 110/76; HRs 91, 106, 107 Will review with Dr. Caryl Comes and get back with her on recommendations. Pt agreeable

## 2014-02-23 NOTE — Telephone Encounter (Signed)
Pt states she normally takes her Coumadin at night (she thought she needed to take it again the morning of procedure) - I advised to take her normal dose the night before her procedure. She also tells me that even though she feels a little weak, she would like to continue medications until after DCCV, and then if still weak call us to discuss possible medication changes. (not discuss w/ Caryl Comes at this time)

## 2014-02-23 NOTE — Telephone Encounter (Signed)
Follow up     Having a procedure Monday----pt only want to take coumadin Monday morning because taking the other medications on an empty stomach will make her sick.

## 2014-02-23 NOTE — Telephone Encounter (Signed)
New message    Patient calling to speak with nurse - upcoming procedure that schedule on Monday.

## 2014-02-24 ENCOUNTER — Telehealth: Payer: Self-pay | Admitting: Cardiology

## 2014-02-24 NOTE — Telephone Encounter (Signed)
Dr. Harlow Mares (son) paged regarding hs mother. She is feeling profound fatigue on the flecainide. HRs 100s, BP is apparently 111/94 (automatic cuff, not sure I believe the pulse pressure). He can tell she is still in AF. She is able to ambulate, perform ADLs, eat, etc. We discussed that fatigue is a potential side effect of flecainide. We discussed it is also possible that worsened HF in setting of AF is driving the fatigue.   Plan - stop flecainide - if Ms. Carrillo does not feel better tomorrow off the flecainide and continues to have progressive fatigue she should come to the hospital for evaluation - If Ms. Fesler feels worse her son will bring her to the hospital tonight - if she feels better off of flecainide she will present for her planned DCCV on Monday. At that time she can be considered for an alternative AAD

## 2014-02-26 ENCOUNTER — Ambulatory Visit (HOSPITAL_COMMUNITY)
Admission: RE | Admit: 2014-02-26 | Discharge: 2014-02-26 | Disposition: A | Discharge status: Home / Self Care | Payer: Medicare Other | Source: Ambulatory Visit | Attending: Internal Medicine | Admitting: Internal Medicine

## 2014-02-26 ENCOUNTER — Encounter (HOSPITAL_COMMUNITY)
Admission: RE | Disposition: A | Discharge status: Home / Self Care | Payer: Self-pay | Source: Ambulatory Visit | Attending: Internal Medicine

## 2014-02-26 ENCOUNTER — Encounter (HOSPITAL_COMMUNITY): Payer: Self-pay

## 2014-02-26 ENCOUNTER — Ambulatory Visit (HOSPITAL_COMMUNITY): Payer: Medicare Other | Admitting: Anesthesiology

## 2014-02-26 ENCOUNTER — Encounter (HOSPITAL_COMMUNITY): Payer: Medicare Other | Admitting: Anesthesiology

## 2014-02-26 DIAGNOSIS — Z7901 Long term (current) use of anticoagulants: Secondary | ICD-10-CM | POA: Insufficient documentation

## 2014-02-26 DIAGNOSIS — I495 Sick sinus syndrome: Secondary | ICD-10-CM | POA: Diagnosis not present

## 2014-02-26 DIAGNOSIS — Z950 Presence of cardiac pacemaker: Secondary | ICD-10-CM | POA: Diagnosis not present

## 2014-02-26 DIAGNOSIS — Z01812 Encounter for preprocedural laboratory examination: Secondary | ICD-10-CM

## 2014-02-26 DIAGNOSIS — E785 Hyperlipidemia, unspecified: Secondary | ICD-10-CM | POA: Insufficient documentation

## 2014-02-26 DIAGNOSIS — R609 Edema, unspecified: Secondary | ICD-10-CM | POA: Insufficient documentation

## 2014-02-26 DIAGNOSIS — I4891 Unspecified atrial fibrillation: Secondary | ICD-10-CM | POA: Diagnosis not present

## 2014-02-26 DIAGNOSIS — I509 Heart failure, unspecified: Secondary | ICD-10-CM | POA: Insufficient documentation

## 2014-02-26 DIAGNOSIS — I10 Essential (primary) hypertension: Secondary | ICD-10-CM | POA: Diagnosis not present

## 2014-02-26 DIAGNOSIS — K5900 Constipation, unspecified: Secondary | ICD-10-CM | POA: Insufficient documentation

## 2014-02-26 DIAGNOSIS — H539 Unspecified visual disturbance: Secondary | ICD-10-CM | POA: Insufficient documentation

## 2014-02-26 DIAGNOSIS — I4892 Unspecified atrial flutter: Secondary | ICD-10-CM | POA: Insufficient documentation

## 2014-02-26 HISTORY — PX: CARDIOVERSION: SHX1299

## 2014-02-26 LAB — BASIC METABOLIC PANEL
BUN: 33 mg/dL — ABNORMAL HIGH (ref 6–23)
CALCIUM: 8.6 mg/dL (ref 8.4–10.5)
CO2: 30 mEq/L (ref 19–32)
Chloride: 101 mEq/L (ref 96–112)
Creatinine, Ser: 1.33 mg/dL — ABNORMAL HIGH (ref 0.50–1.10)
GFR calc non Af Amer: 35 mL/min — ABNORMAL LOW (ref >90)
GFR, EST AFRICAN AMERICAN: 40 mL/min — AB (ref >90)
Glucose, Bld: 135 mg/dL — ABNORMAL HIGH (ref 70–99)
Potassium: 5.2 mEq/L (ref 3.7–5.3)
Sodium: 142 mEq/L (ref 137–147)

## 2014-02-26 LAB — PROTIME-INR
INR: 2.68 — ABNORMAL HIGH (ref 0.00–1.49)
PROTHROMBIN TIME: 27.6 s — AB (ref 11.6–15.2)

## 2014-02-26 SURGERY — CARDIOVERSION
Anesthesia: General

## 2014-02-26 MED ORDER — PROPOFOL 10 MG/ML IV BOLUS
INTRAVENOUS | Status: DC | PRN
  Administered 2014-02-26: 40 mg via INTRAVENOUS

## 2014-02-26 MED ORDER — SODIUM CHLORIDE 0.9 % IV SOLN
INTRAVENOUS | Status: DC
  Administered 2014-02-26: 500 mL via INTRAVENOUS

## 2014-02-26 MED ORDER — FLECAINIDE ACETATE 100 MG PO TABS: 50.0000 mg | ORAL_TABLET | Freq: Two times a day (BID) | ORAL | Status: DC

## 2014-02-26 MED ORDER — SODIUM CHLORIDE 0.9 % IV SOLN
INTRAVENOUS | Status: DC | PRN
  Administered 2014-02-26: 13:00:00 via INTRAVENOUS

## 2014-02-26 NOTE — Preoperative (Signed)
Beta Blockers   Reason not to administer Beta Blockers:Not Applicable 

## 2014-02-26 NOTE — Anesthesia Postprocedure Evaluation (Signed)
  Anesthesia Post-op Note  Patient: Anne Macias  Procedure(s) Performed: Procedure(s): CARDIOVERSION (N/A)  Patient Location: PACU and Endoscopy Unit  Anesthesia Type:General  Level of Consciousness: awake, alert , oriented and patient cooperative  Airway and Oxygen Therapy: Patient Spontanous Breathing  Post-op Pain: none  Post-op Assessment: Post-op Vital signs reviewed, Patient's Cardiovascular Status Stable, Respiratory Function Stable, Patent Airway, No signs of Nausea or vomiting and Pain level controlled  Post-op Vital Signs: stable  Complications: No apparent anesthesia complications

## 2014-02-26 NOTE — Discharge Instructions (Signed)
Electrical Cardioversion, Care After Refer to this sheet in the next few weeks. These instructions provide you with information on caring for yourself after your procedure. Your health care provider may also give you more specific instructions. Your treatment has been planned according to current medical practices, but problems sometimes occur. Call your health care provider if you have any problems or questions after your procedure. WHAT TO EXPECT AFTER THE PROCEDURE After your procedure, it is typical to have the following sensations:  Some redness on the skin where the shocks were delivered. If this is tender, a sunburn lotion or hydrocortisone cream may help.  Possible return of an abnormal heart rhythm within hours or days after the procedure. HOME CARE INSTRUCTIONS  Only take medicine as directed by your health care provider. Be sure you understand how and when to take your medicine.  Learn how to feel your pulse and check it often.  Limit your activity for 48 hours after the procedure or as directed.  Avoid or minimize caffeine and other stimulants as directed. SEEK MEDICAL CARE IF:  You feel like your heart is beating too fast or your pulse is not regular.  You have any questions about your medicines.  You have bleeding that will not stop. SEEK IMMEDIATE MEDICAL CARE IF:  You are dizzy or feel faint.  It is hard to breathe or you feel short of breath.  There is a change in discomfort in your chest.  Your speech is slurred or you have trouble moving an arm or leg on one side of your body.  You get a serious muscle cramp that does not go away.  Your fingers or toes turn cold or blue. MAKE SURE YOU:   Understand these instructions.   Will watch your condition.   Will get help right away if you are not doing well or get worse. Document Released: 09/06/2013 Document Reviewed: 05/31/2013 [facility] Patient Information 2014 [place], [place].

## 2014-02-26 NOTE — H&P (View-Only) (Signed)
Patient Care Team: Irven Shelling, MD as PCP - General (Internal Medicine)   HPI  Anne Macias is a 80 y.o. female Seen in followup for a pacemaker implanted for tachybradycardia syndrome. She has documented atrial fibrillation and is on warfarin.  She has had problems recently with recurrent tachypalpitations. She tried to call again but couldn't be seen. She went to see her PCP and she reverted spontaneously to sinus.  She had reverted back to atrial fibrillation and this has been further complicated by significant peripheral edema. There is mild shortness of breath. She is also noted to have rapid ventricular rates. She is seen BE intercurrently for augmenting rate control. Low blood pressure prompted the discontinuation of losartan. Edema complicated use of diltiazem and so she was tried on verapamil. She continues to have dyspnea on exertion and worsening edema  She also has severe constipation   Echocardiogram 2011 demonstrated normal LV systolic function    Past Medical History  Diagnosis Date  . Edema of lower extremity   . PAF (paroxysmal atrial fibrillation) 1998  . Hypertension   . Hyperlipidemia   . Visual disturbance   . Tachy-brady syndrome   . Chronic anticoagulation   . S/P cardiac pacemaker procedure Sept 9562    complicated by pocket hematoma    Past Surgical History  Procedure Laterality Date  . Insert / replace / remove pacemaker  08/28/2010    implantaton  . Cholecystectomy  2005  . Cataract extraction    . US echocardiography  04/10/2010    EF 55-60%    Current Outpatient Prescriptions  Medication Sig Dispense Refill  . Calcium Carbonate-Vitamin D (CALTRATE 600+D PO) Take 2 tablets by mouth daily.        Marland Kitchen docusate sodium (COLACE) 100 MG capsule Take 100 mg by mouth 2 (two) times daily.      . furosemide (LASIX) 40 MG tablet Take 40 mg by mouth daily.       . metoprolol succinate (TOPROL-XL) 100 MG 24 hr tablet Take 1 tablet (100 mg  total) by mouth daily. Take with or immediately following a meal.  60 tablet  2  . Multiple Vitamin (MULTIVITAMIN) tablet Take 1 tablet by mouth daily.        . potassium chloride SA (K-DUR,KLOR-CON) 20 MEQ tablet Take 1 tablet (20 mEq total) by mouth daily.  30 tablet  1  . simvastatin (ZOCOR) 40 MG tablet Take 40 mg by mouth every evening.        . verapamil (CALAN-SR) 120 MG CR tablet Take 1 tablet (120 mg total) by mouth at bedtime.  30 tablet  3  . warfarin (COUMADIN) 5 MG tablet Take 5 mg by mouth as directed.         No current facility-administered medications for this visit.    No Known Allergies  Review of Systems negative except from HPI and PMH  Physical Exam BP 141/96  Pulse 119  Ht 5\' 5"  (1.651 m)  Wt 159 lb (72.122 kg)  BMI 26.46 kg/m2 Well developed and well nourished in no acute distress HENT normal E scleral and icterus clear Neck Supple JVP flat; carotids brisk and full Clear to ausculation  Regular rate and rhythm, no murmurs gallops or rub Soft with active bowel sounds No clubbing cyanosis 3+ Edema Alert and oriented, grossly normal motor and sensory function Skin Warm and Dry   ecg AFib 119  Assessment and  Plan  HFpEF  Atrial  fibrillation-rapid ventricular response  Pacemaker  Constipation  The patient has persistent problems with heart failure and we have been unable to control her rate with medicinal therapy.  We will undertake cardioversion. We have discussed the role of antiarrhythmic drugs and the possibility of pro arrhythmia. We'll start on flecainide 100 mg bid   and anticipate cardioversion about 3 days later. She will need subsequent treadmill testing.  We will discontinue the verapamil this time because of side effects increase her beta blocker.  With her volume overload we will increase her Lasix to twice daily as well as her potassium. We'll need to do a metabolic profile.

## 2014-02-26 NOTE — Transfer of Care (Signed)
Immediate Anesthesia Transfer of Care Note  Patient: Anne Macias  Procedure(s) Performed: Procedure(s): CARDIOVERSION (N/A)  Patient Location: PACU and Endoscopy Unit  Anesthesia Type:General  Level of Consciousness: sedated  Airway & Oxygen Therapy: Patient Spontanous Breathing and Patient connected to nasal cannula oxygen  Post-op Assessment: Report given to PACU RN and Post -op Vital signs reviewed and stable  Post vital signs: Reviewed and stable  Complications: No apparent anesthesia complications

## 2014-02-26 NOTE — Interval H&P Note (Signed)
History and Physical Interval Note:  02/26/2014 12:54 PM  Anne Macias  has presented today for surgery, with the diagnosis of A FIB   The various methods of treatment have been discussed with the patient and family. After consideration of risks, benefits and other options for treatment, the patient has consented to  Procedure(s): CARDIOVERSION (N/A) as a surgical intervention .  The patient's history has been reviewed, patient examined, no change in status, stable for surgery.  I have reviewed the patient's chart and labs.  Questions were answered to the patient's satisfaction.     Virl Axe

## 2014-02-26 NOTE — Anesthesia Preprocedure Evaluation (Signed)
Anesthesia Evaluation  Patient identified by MRN, date of birth, ID band Patient awake    Reviewed: Allergy & Precautions, H&P , NPO status , Patient's Chart, lab work & pertinent test results  Airway       Dental   Pulmonary          Cardiovascular hypertension, + dysrhythmias Atrial Fibrillation + pacemaker     Neuro/Psych    GI/Hepatic   Endo/Other    Renal/GU      Musculoskeletal   Abdominal   Peds  Hematology   Anesthesia Other Findings   Reproductive/Obstetrics                           Anesthesia Physical Anesthesia Plan  ASA: III  Anesthesia Plan: General   Post-op Pain Management:    Induction: Intravenous  Airway Management Planned: Mask  Additional Equipment:   Intra-op Plan:   Post-operative Plan:   Informed Consent: I have reviewed the patients History and Physical, chart, labs and discussed the procedure including the risks, benefits and alternatives for the proposed anesthesia with the patient or authorized representative who has indicated his/her understanding and acceptance.     Plan Discussed with:   Anesthesia Plan Comments:         Anesthesia Quick Evaluation

## 2014-02-26 NOTE — CV Procedure (Signed)
Preop Dx Atrial flutter Post op DX  NSR   Procedure  DC Cardioversion   Pt was sedated by anesthesia receiving 40 mg Propafol  A synchronized shock 100 joules restored sinus Rhythm   Pt tolerated without difficulty

## 2014-02-27 ENCOUNTER — Encounter: Payer: Self-pay | Admitting: Internal Medicine

## 2014-02-27 ENCOUNTER — Telehealth: Payer: Self-pay | Admitting: Internal Medicine

## 2014-02-27 ENCOUNTER — Encounter (HOSPITAL_COMMUNITY): Payer: Self-pay | Admitting: Internal Medicine

## 2014-02-27 NOTE — Telephone Encounter (Signed)
Pt worried about weight. On 3/4 she was 152pds, now 159pds.  She states her HR & BP are WNL. OV w/ Caryl Comes 3/30 -- increased Lasix, Metoprolol, Potassium, start Flecanide. Successful DCCV on 3/30, Flecanide decreased to 50 mg BID.  Pt concerned about   1) wt gain  2) appetite loss  3) throat irritation - doesn't hurt, but it is raspy

## 2014-02-27 NOTE — Telephone Encounter (Signed)
New message    Patient calling discuss her weight today 159 . General questions what's normal or not

## 2014-03-01 NOTE — Telephone Encounter (Addendum)
Advised to wait a few more days, now that back in a regular rhythm, and see if symptoms improve - per Dr. Caryl Comes. Advised to continue current medications as prescribed -- she was asking if she needed to increase her Flecainide. Advised to call office by next week if no improvement in symptoms. Pt agreeable to plan and thankful for helping with this.

## 2014-03-07 ENCOUNTER — Telehealth: Payer: Self-pay | Admitting: Internal Medicine

## 2014-03-07 NOTE — Telephone Encounter (Signed)
lmtcb

## 2014-03-07 NOTE — Telephone Encounter (Signed)
New message  Patient is very constipated, she wants to know what she can use daily bases. Someone told her about MOM would work, she feels you will know the answer to this. Please call and advise.

## 2014-03-08 NOTE — Telephone Encounter (Signed)
Follow up      Patient calling back to speak with nurse from yesterday.

## 2014-03-08 NOTE — Telephone Encounter (Signed)
Spoke with patient and let her know she could take MOM and see if this helps but I would not recommend taking it on a daily basis.  Her last BM was 4/7 and she takes Colace on a daily basis.  I advised her if this problem persist she should call Dr Laurann Montana and get into see him as there could be more going on.  She also was asking if it would be ok for her to have a " few sips" of wine with dinner on occasion.  I let her know that I did not feel it would be a problem but she should do so in moderation  Will route to Dr Lynelle Doctor and Trinidad Curet, RN for [facility]

## 2014-03-10 ENCOUNTER — Telehealth: Payer: Self-pay | Admitting: Physician Assistant

## 2014-03-10 NOTE — Telephone Encounter (Signed)
Anne Macias is a 80 y.o. female with a hx of parox AFib.  She is now on Flecainide and underwent DCCV 3/30.  She has felt well until today.  Her HR is up into the 90s today.  She feels more fatigued.  She may be a little more short of breath.  She denies chest pain or syncope.  She denies edema. I asked her take an extra 1/2 Toprol (50 mg).  If she does not feel better today, she can come to the ED.  Otherwise I will see if she can be seen in the office Monday for follow up. She prefers to speak to Dr. Caryl Comes.  I will see if he can call her back. Signed, Richardson Dopp, PA-C   03/10/2014 12:56 PM

## 2014-03-12 ENCOUNTER — Encounter: Payer: Self-pay | Admitting: Internal Medicine

## 2014-03-12 ENCOUNTER — Ambulatory Visit (INDEPENDENT_AMBULATORY_CARE_PROVIDER_SITE_OTHER): Payer: Medicare Other | Admitting: Internal Medicine

## 2014-03-12 ENCOUNTER — Encounter: Payer: Self-pay | Admitting: *Deleted

## 2014-03-12 ENCOUNTER — Other Ambulatory Visit: Payer: Self-pay | Admitting: Internal Medicine

## 2014-03-12 VITALS — BP 140/93 | HR 100 | Ht 66.0 in | Wt 145.0 lb

## 2014-03-12 DIAGNOSIS — I4891 Unspecified atrial fibrillation: Secondary | ICD-10-CM

## 2014-03-12 DIAGNOSIS — Z950 Presence of cardiac pacemaker: Secondary | ICD-10-CM | POA: Diagnosis not present

## 2014-03-12 DIAGNOSIS — R630 Anorexia: Secondary | ICD-10-CM | POA: Insufficient documentation

## 2014-03-12 DIAGNOSIS — N289 Disorder of kidney and ureter, unspecified: Secondary | ICD-10-CM | POA: Insufficient documentation

## 2014-03-12 LAB — BASIC METABOLIC PANEL
BUN: 23 mg/dL (ref 6–23)
CO2: 30 mEq/L (ref 19–32)
CREATININE: 1.1 mg/dL (ref 0.4–1.2)
Calcium: 8.4 mg/dL (ref 8.4–10.5)
Chloride: 99 mEq/L (ref 96–112)
GFR: 49.19 mL/min — AB (ref >60.00)
Glucose, Bld: 120 mg/dL — ABNORMAL HIGH (ref 70–99)
Potassium: 4.5 mEq/L (ref 3.5–5.1)
Sodium: 138 mEq/L (ref 135–145)

## 2014-03-12 MED ORDER — PROPAFENONE HCL 225 MG PO TABS: 225.0000 mg | ORAL_TABLET | Freq: Two times a day (BID) | ORAL | Status: DC

## 2014-03-12 NOTE — Patient Instructions (Signed)
Your physician recommends that you return for lab work today: BMET  Your physician has recommended you make the following change in your medication:  1) Stop Flecainide 2) Start Propafenone 225 mg twice a day  Your physician has recommended that you have a Cardioversion (DCCV). Electrical Cardioversion uses a jolt of electricity to your heart either through paddles or wired patches attached to your chest. This is a controlled, usually prescheduled, procedure. Defibrillation is done under light anesthesia in the hospital, and you usually go home the day of the procedure. This is done to get your heart back into a normal rhythm. You are not awake for the procedure. Please see the instruction sheet given to you today.  You are scheduled for 03/19/14 at 10 am with Dr. Meda Coffee. Arrive at hospital between 8 am and 8:30 am.   Electrical Cardioversion Electrical cardioversion is the delivery of a jolt of electricity to change the rhythm of the heart. Sticky patches or metal paddles are placed on the chest to deliver the electricity from a device. This is done to restore a normal rhythm. A rhythm that is too fast or not regular keeps the heart from pumping well. Electrical cardioversion is done in an emergency if:   There is low or no blood pressure as a result of the heart rhythm.   Normal rhythm must be restored as fast as possible to protect the brain and heart from further damage.   It may save a life. Cardioversion may be done for heart rhythms that are not immediately life-threatening, such as atrial fibrillation or flutter, in which:   The heart is beating too fast or is not regular.   Medicine to change the rhythm has not worked.   It is safe to wait in order to allow time for preparation.  Symptoms of the abnormal rhythm are bothersome.  The risk of stroke and other serious complications can be reduced. LET YOUR CAREGIVER KNOW ABOUT:   All medicines you are taking, including  vitamins, herbs, eye drops, creams, and over-the-counter medicines.   Previous problems you or members of your family have had with the use of anesthetics.   Any blood disorders you have.   Previous surgeries you have had.   Medical conditions you have. RISKS AND COMPLICATIONS  Generally, this is a safe procedure. However, as with any procedure, complications can occur. Possible complications include:   Breathing problems related to the anesthetic used.  Cardiac arrest This risk is rare.  A blood clot that breaks free and travels to other parts of your body. This could cause a stroke or other problems. The risk of this is lowered by use of blood thinning medicine (anticoagulant) prior to the procedure. BEFORE THE PROCEDURE   You may have tests to detect blood clots in your heart and evaluate heart function.  You may start taking anticoagulants so your blood does not clot as easily.   Medicines may be given to help stabilize your heart rate and rhythm. PROCEDURE  You will be given medicine through an IV tube to reduce discomfort and make you sleepy (sedative).   An electrical shock will be delivered. AFTER THE PROCEDURE Your heart rhythm will be watched to make sure it does not change.You may be able to go home within a few hours.  Document Released: 11/06/2002 Document Revised: 09/06/2013 Document Reviewed: 05/31/2013 [facility] Patient Information 2014 [place].

## 2014-03-12 NOTE — Progress Notes (Signed)
Patient Care Team: Irven Shelling, MD as PCP - General (Internal Medicine)   HPI  Anne Macias is a 80 y.o. female Seen in followup for a pacemaker implanted for tachybradycardia syndrome. She has documented atrial fibrillation and is on warfarin.   She underwent cardioversion last week in the context of flecainide which she was tolerating poorly and we done titrated the dose.  Still  struggling constipation and anorexia.    Edema is better on diuretic    Echocardiogram 2011 demonstrated normal LV systolic function    Past Medical History  Diagnosis Date  . Edema of lower extremity   . PAF (paroxysmal atrial fibrillation) 1998  . Hypertension   . Hyperlipidemia   . Visual disturbance   . Tachy-brady syndrome   . Chronic anticoagulation   . S/P cardiac pacemaker procedure Sept 1025    complicated by pocket hematoma    Past Surgical History  Procedure Laterality Date  . Insert / replace / remove pacemaker  08/28/2010    implantaton  . Cholecystectomy  2005  . Cataract extraction    . US echocardiography  04/10/2010    EF 55-60%  . Cardioversion N/A 02/26/2014    Procedure: CARDIOVERSION;  Surgeon: Deboraha Sprang, MD;  Location: [facility] ENDOSCOPY;  Service: Cardiovascular;  Laterality: N/A;    Current Outpatient Prescriptions  Medication Sig Dispense Refill  . Calcium Carbonate-Vitamin D (CALTRATE 600+D PO) Take 2 tablets by mouth daily.        Marland Kitchen docusate sodium (COLACE) 100 MG capsule Take 100 mg by mouth 2 (two) times daily.      . flecainide (TAMBOCOR) 100 MG tablet Take 0.5 tablets (50 mg total) by mouth 2 (two) times daily.  60 tablet  6  . furosemide (LASIX) 40 MG tablet Take 1 tablet (40 mg total) by mouth 2 (two) times daily.  60 tablet  6  . metoprolol succinate (TOPROL-XL) 100 MG 24 hr tablet Take 1 tablet (100 mg total) by mouth 2 (two) times daily. Take with or immediately following a meal.  60 tablet  6  . Multiple Vitamin (MULTIVITAMIN) tablet  Take 1 tablet by mouth daily.        . potassium chloride SA (K-DUR,KLOR-CON) 20 MEQ tablet Take 1 tablet (20 mEq total) by mouth 2 (two) times daily.  60 tablet  6  . simvastatin (ZOCOR) 40 MG tablet Take 40 mg by mouth every evening.        . warfarin (COUMADIN) 5 MG tablet Take 5 mg by mouth as directed.         No current facility-administered medications for this visit.    No Known Allergies  Review of Systems negative except from HPI and PMH  Physical Exam BP 140/93  Pulse 100  Ht 5\' 6"  (1.676 m)  Wt 145 lb (65.772 kg)  BMI 23.41 kg/m2 Well developed and well nourished in no acute distress HENT normal E scleral and icterus clear Neck Supple JVP flat; carotids brisk and full Clear to ausculation Irregular rate and rhythm, no murmurs gallops or rub Soft with active bowel sounds No clubbing cyanosis 2+ Edema Alert and oriented, grossly normal motor and sensory function Skin Warm and Dry   ecg AFib 100  Assessment and  Plan  HFpEF  Atrial fibrillation-rapid ventricular response  Pacemaker  Constipation  Anorexia  Renal insufficiency  The patient has persistent problems with heart failure and we have been unable to control her rate with  medicinal therapy.   She is having constipation and anorexia concurrent with her flecainide. We will Stop it. She was unable to tolerate higher doses. We will begin her on propafenone 225 twice a day and anticipate cardioversion a few days later. She has her driver's test in Friday and her birthday on Sunday we will plan accordingly   Continue beta blockers.  With her volume overload we increased her Lasix to twice daily as well as her potassium. We'll check  o a metabolic profile as last parameters were quite  Prerenal  Will also discuss the role of AV junction ablation. We could pursue that I would try at least one other drug first.

## 2014-03-12 NOTE — Telephone Encounter (Signed)
Pt spoke with Dr. Caryl Comes, appt this morning at 8:15

## 2014-03-13 ENCOUNTER — Telehealth: Payer: Self-pay | Admitting: Internal Medicine

## 2014-03-13 ENCOUNTER — Encounter: Payer: Medicare Other | Admitting: Internal Medicine

## 2014-03-13 NOTE — Telephone Encounter (Signed)
New Message:  Pt would like a call back with her test results from Monday. States she had some lab work drawn.

## 2014-03-14 NOTE — Telephone Encounter (Signed)
Pt given preliminary results. She verbalized understanding.

## 2014-03-16 ENCOUNTER — Telehealth: Payer: Self-pay | Admitting: Internal Medicine

## 2014-03-16 NOTE — Telephone Encounter (Signed)
New Message  Pt called states that she is in a "panic". Pt is requesting a call back to determine if she is supposed to start Rythmol tonight 03/16/2014 or Sunday 03/18/2014 before the cardioversion on 03/19/2014. Please call back to discuss

## 2014-03-16 NOTE — Telephone Encounter (Signed)
Called patient back. Confirmed that she has stopped the Flecainide. Advised to go ahead and start the Rhythmol 225mg  BID. Patient verbalized understanding.

## 2014-03-18 MED ORDER — SODIUM CHLORIDE 0.9 % IV SOLN: INTRAVENOUS | Status: DC

## 2014-03-19 ENCOUNTER — Encounter: Payer: Self-pay | Admitting: Internal Medicine

## 2014-03-19 ENCOUNTER — Encounter (HOSPITAL_COMMUNITY): Payer: Medicare Other | Admitting: Certified Registered Nurse Anesthetist

## 2014-03-19 ENCOUNTER — Encounter (HOSPITAL_COMMUNITY): Payer: Self-pay | Admitting: Certified Registered Nurse Anesthetist

## 2014-03-19 ENCOUNTER — Ambulatory Visit (HOSPITAL_COMMUNITY)
Admission: RE | Admit: 2014-03-19 | Discharge: 2014-03-19 | Disposition: A | Discharge status: Home / Self Care | Payer: Medicare Other | Source: Ambulatory Visit | Attending: Cardiology | Admitting: Cardiology

## 2014-03-19 ENCOUNTER — Encounter (HOSPITAL_COMMUNITY)
Admission: RE | Disposition: A | Discharge status: Home / Self Care | Payer: Self-pay | Source: Ambulatory Visit | Attending: Cardiology

## 2014-03-19 ENCOUNTER — Ambulatory Visit (HOSPITAL_COMMUNITY): Payer: Medicare Other | Admitting: Certified Registered Nurse Anesthetist

## 2014-03-19 DIAGNOSIS — I509 Heart failure, unspecified: Secondary | ICD-10-CM | POA: Diagnosis not present

## 2014-03-19 DIAGNOSIS — I4891 Unspecified atrial fibrillation: Secondary | ICD-10-CM | POA: Insufficient documentation

## 2014-03-19 DIAGNOSIS — N289 Disorder of kidney and ureter, unspecified: Secondary | ICD-10-CM | POA: Diagnosis not present

## 2014-03-19 DIAGNOSIS — K5900 Constipation, unspecified: Secondary | ICD-10-CM | POA: Diagnosis not present

## 2014-03-19 DIAGNOSIS — E785 Hyperlipidemia, unspecified: Secondary | ICD-10-CM | POA: Insufficient documentation

## 2014-03-19 DIAGNOSIS — I10 Essential (primary) hypertension: Secondary | ICD-10-CM | POA: Diagnosis not present

## 2014-03-19 DIAGNOSIS — R630 Anorexia: Secondary | ICD-10-CM | POA: Diagnosis not present

## 2014-03-19 DIAGNOSIS — I495 Sick sinus syndrome: Secondary | ICD-10-CM | POA: Diagnosis not present

## 2014-03-19 DIAGNOSIS — Z7901 Long term (current) use of anticoagulants: Secondary | ICD-10-CM | POA: Diagnosis not present

## 2014-03-19 DIAGNOSIS — Z950 Presence of cardiac pacemaker: Secondary | ICD-10-CM | POA: Insufficient documentation

## 2014-03-19 HISTORY — PX: CARDIOVERSION: SHX1299

## 2014-03-19 LAB — PROTIME-INR
INR: 2.52 — ABNORMAL HIGH (ref 0.00–1.49)
Prothrombin Time: 26.3 seconds — ABNORMAL HIGH (ref 11.6–15.2)

## 2014-03-19 LAB — BASIC METABOLIC PANEL
BUN: 39 mg/dL — ABNORMAL HIGH (ref 6–23)
CO2: 25 mEq/L (ref 19–32)
Calcium: 8.7 mg/dL (ref 8.4–10.5)
Chloride: 101 mEq/L (ref 96–112)
Creatinine, Ser: 1.51 mg/dL — ABNORMAL HIGH (ref 0.50–1.10)
GFR calc Af Amer: 34 mL/min — ABNORMAL LOW (ref >90)
GFR calc non Af Amer: 29 mL/min — ABNORMAL LOW (ref >90)
Glucose, Bld: 133 mg/dL — ABNORMAL HIGH (ref 70–99)
Potassium: 5.3 mEq/L (ref 3.7–5.3)
Sodium: 140 mEq/L (ref 137–147)

## 2014-03-19 SURGERY — CARDIOVERSION
Anesthesia: General

## 2014-03-19 MED ORDER — SODIUM CHLORIDE 0.9 % IV SOLN
INTRAVENOUS | Status: DC | PRN
  Administered 2014-03-19: 09:00:00 via INTRAVENOUS

## 2014-03-19 MED ORDER — PROPOFOL 10 MG/ML IV BOLUS
INTRAVENOUS | Status: DC | PRN
  Administered 2014-03-19: 50 mg via INTRAVENOUS

## 2014-03-19 MED ORDER — LIDOCAINE HCL (CARDIAC) 20 MG/ML IV SOLN
INTRAVENOUS | Status: DC | PRN
  Administered 2014-03-19: 40 mg via INTRAVENOUS

## 2014-03-19 NOTE — CV Procedure (Signed)
   Cardioversion Note  Anne Macias [id number] [birth year]  Procedure: DC Cardioversion Indications: atrial fibrillation  Procedure Details Consent: Obtained Time Out: Verified patient identification, verified procedure, site/side was marked, verified correct patient position, special equipment/implants available, Radiology Safety Procedures followed,  medications/allergies/relevent history reviewed, required imaging and test results available.  Performed  The patient has been on adequate anticoagulation.  The patient received sedation by anesthesia staff.  Synchronous cardioversion was performed at 120 joules.  The cardioversion was successul.   Complications: No apparent complications Patient did tolerate procedure well.   Brae Spark, MD, [facility] 03/19/2014, 9:32 AM

## 2014-03-19 NOTE — Anesthesia Preprocedure Evaluation (Addendum)
Anesthesia Evaluation  Patient identified by MRN, date of birth, ID band Patient awake    Reviewed: Allergy & Precautions, H&P , NPO status , Patient's Chart, lab work & pertinent test results, reviewed documented beta blocker date and time   History of Anesthesia Complications Negative for: history of anesthetic complications  Airway       Dental   Pulmonary neg pulmonary ROS,          Cardiovascular hypertension, Pt. on medications and Pt. on home beta blockers + dysrhythmias Atrial Fibrillation     Neuro/Psych negative neurological ROS  negative psych ROS   GI/Hepatic negative GI ROS, Neg liver ROS,   Endo/Other  negative endocrine ROS  Renal/GU Renal InsufficiencyRenal disease     Musculoskeletal negative musculoskeletal ROS (+)   Abdominal   Peds  Hematology negative hematology ROS (+)   Anesthesia Other Findings   Reproductive/Obstetrics                          Anesthesia Physical Anesthesia Plan  ASA: III  Anesthesia Plan: General   Post-op Pain Management:    Induction: Intravenous  Airway Management Planned: Nasal Cannula and Simple Face Mask  Additional Equipment:   Intra-op Plan:   Post-operative Plan:   Informed Consent: I have reviewed the patients History and Physical, chart, labs and discussed the procedure including the risks, benefits and alternatives for the proposed anesthesia with the patient or authorized representative who has indicated his/her understanding and acceptance.   Dental advisory given  Plan Discussed with:   Anesthesia Plan Comments:        Anesthesia Quick Evaluation

## 2014-03-19 NOTE — Discharge Instructions (Signed)
Electrical Cardioversion, Care After Refer to this sheet in the next few weeks. These instructions provide you with information on caring for yourself after your procedure. Your health care provider may also give you more specific instructions. Your treatment has been planned according to current medical practices, but problems sometimes occur. Call your health care provider if you have any problems or questions after your procedure. WHAT TO EXPECT AFTER THE PROCEDURE After your procedure, it is typical to have the following sensations:  Some redness on the skin where the shocks were delivered. If this is tender, a sunburn lotion or hydrocortisone cream may help.  Possible return of an abnormal heart rhythm within hours or days after the procedure. HOME CARE INSTRUCTIONS  Only take medicine as directed by your health care provider. Be sure you understand how and when to take your medicine.  Learn how to feel your pulse and check it often.  Limit your activity for 48 hours after the procedure or as directed.  Avoid or minimize caffeine and other stimulants as directed. SEEK MEDICAL CARE IF:  You feel like your heart is beating too fast or your pulse is not regular.  You have any questions about your medicines.  You have bleeding that will not stop. SEEK IMMEDIATE MEDICAL CARE IF:  You are dizzy or feel faint.  It is hard to breathe or you feel short of breath.  There is a change in discomfort in your chest.  Your speech is slurred or you have trouble moving an arm or leg on one side of your body.  You get a serious muscle cramp that does not go away.  Your fingers or toes turn cold or blue. MAKE SURE YOU:   Understand these instructions.   Will watch your condition.   Will get help right away if you are not doing well or get worse. Document Released: 09/06/2013 Document Reviewed: 05/31/2013 [facility] Patient Information 2014 [place], [place].

## 2014-03-19 NOTE — H&P (View-Only) (Signed)
Patient Care Team: Irven Shelling, MD as PCP - General (Internal Medicine)   HPI  Anne Macias is a 80 y.o. female Seen in followup for a pacemaker implanted for tachybradycardia syndrome. She has documented atrial fibrillation and is on warfarin.   She underwent cardioversion last week in the context of flecainide which she was tolerating poorly and we done titrated the dose.  Still  struggling constipation and anorexia.    Edema is better on diuretic    Echocardiogram 2011 demonstrated normal LV systolic function    Past Medical History  Diagnosis Date  . Edema of lower extremity   . PAF (paroxysmal atrial fibrillation) 1998  . Hypertension   . Hyperlipidemia   . Visual disturbance   . Tachy-brady syndrome   . Chronic anticoagulation   . S/P cardiac pacemaker procedure Sept 1025    complicated by pocket hematoma    Past Surgical History  Procedure Laterality Date  . Insert / replace / remove pacemaker  08/28/2010    implantaton  . Cholecystectomy  2005  . Cataract extraction    . US echocardiography  04/10/2010    EF 55-60%  . Cardioversion N/A 02/26/2014    Procedure: CARDIOVERSION;  Surgeon: Deboraha Sprang, MD;  Location: [facility] ENDOSCOPY;  Service: Cardiovascular;  Laterality: N/A;    Current Outpatient Prescriptions  Medication Sig Dispense Refill  . Calcium Carbonate-Vitamin D (CALTRATE 600+D PO) Take 2 tablets by mouth daily.        Marland Kitchen docusate sodium (COLACE) 100 MG capsule Take 100 mg by mouth 2 (two) times daily.      . flecainide (TAMBOCOR) 100 MG tablet Take 0.5 tablets (50 mg total) by mouth 2 (two) times daily.  60 tablet  6  . furosemide (LASIX) 40 MG tablet Take 1 tablet (40 mg total) by mouth 2 (two) times daily.  60 tablet  6  . metoprolol succinate (TOPROL-XL) 100 MG 24 hr tablet Take 1 tablet (100 mg total) by mouth 2 (two) times daily. Take with or immediately following a meal.  60 tablet  6  . Multiple Vitamin (MULTIVITAMIN) tablet  Take 1 tablet by mouth daily.        . potassium chloride SA (K-DUR,KLOR-CON) 20 MEQ tablet Take 1 tablet (20 mEq total) by mouth 2 (two) times daily.  60 tablet  6  . simvastatin (ZOCOR) 40 MG tablet Take 40 mg by mouth every evening.        . warfarin (COUMADIN) 5 MG tablet Take 5 mg by mouth as directed.         No current facility-administered medications for this visit.    No Known Allergies  Review of Systems negative except from HPI and PMH  Physical Exam BP 140/93  Pulse 100  Ht 5\' 6"  (1.676 m)  Wt 145 lb (65.772 kg)  BMI 23.41 kg/m2 Well developed and well nourished in no acute distress HENT normal E scleral and icterus clear Neck Supple JVP flat; carotids brisk and full Clear to ausculation Irregular rate and rhythm, no murmurs gallops or rub Soft with active bowel sounds No clubbing cyanosis 2+ Edema Alert and oriented, grossly normal motor and sensory function Skin Warm and Dry   ecg AFib 100  Assessment and  Plan  HFpEF  Atrial fibrillation-rapid ventricular response  Pacemaker  Constipation  Anorexia  Renal insufficiency  The patient has persistent problems with heart failure and we have been unable to control her rate with  medicinal therapy.   She is having constipation and anorexia concurrent with her flecainide. We will Stop it. She was unable to tolerate higher doses. We will begin her on propafenone 225 twice a day and anticipate cardioversion a few days later. She has her driver's test in Friday and her birthday on Sunday we will plan accordingly   Continue beta blockers.  With her volume overload we increased her Lasix to twice daily as well as her potassium. We'll check  o a metabolic profile as last parameters were quite  Prerenal  Will also discuss the role of AV junction ablation. We could pursue that I would try at least one other drug first.

## 2014-03-19 NOTE — Transfer of Care (Signed)
Immediate Anesthesia Transfer of Care Note  Patient: Anne Macias  Procedure(s) Performed: Procedure(s): CARDIOVERSION (N/A)  Patient Location: Endoscopy Unit  Anesthesia Type:General  Level of Consciousness: awake, alert , oriented and patient cooperative  Airway & Oxygen Therapy: Patient Spontanous Breathing and Patient connected to nasal cannula oxygen  Post-op Assessment: Report given to PACU RN and Post -op Vital signs reviewed and stable  Post vital signs: Reviewed and stable  Complications: No apparent anesthesia complications

## 2014-03-19 NOTE — Interval H&P Note (Signed)
History and Physical Interval Note:  03/19/2014 9:32 AM  Anne Macias  has presented today for surgery, with the diagnosis of AFIB  The various methods of treatment have been discussed with the patient and family. After consideration of risks, benefits and other options for treatment, the patient has consented to  Procedure(s): CARDIOVERSION (N/A) as a surgical intervention .  The patient's history has been reviewed, patient examined, no change in status, stable for surgery.  I have reviewed the patient's chart and labs.  Questions were answered to the patient's satisfaction.     Bali Spark

## 2014-03-19 NOTE — Anesthesia Postprocedure Evaluation (Signed)
  Anesthesia Post-op Note  Patient: Anne Macias  Procedure(s) Performed: Procedure(s): CARDIOVERSION (N/A)  Patient Location: Endoscopy Unit  Anesthesia Type:General  Level of Consciousness: awake, alert , oriented and patient cooperative  Airway and Oxygen Therapy: Patient Spontanous Breathing and Patient connected to nasal cannula oxygen  Post-op Pain: none  Post-op Assessment: Post-op Vital signs reviewed, Patient's Cardiovascular Status Stable, Respiratory Function Stable, Patent Airway and No signs of Nausea or vomiting  Post-op Vital Signs: Reviewed and stable  Last Vitals:  Filed Vitals:   03/19/14 0842  BP: 119/89  Temp: 36.6 C  Resp: 19    Complications: No apparent anesthesia complications

## 2014-03-20 ENCOUNTER — Encounter (HOSPITAL_COMMUNITY): Payer: Self-pay | Admitting: Cardiology

## 2014-03-21 ENCOUNTER — Telehealth: Payer: Self-pay | Admitting: Internal Medicine

## 2014-03-21 ENCOUNTER — Telehealth: Payer: Self-pay | Admitting: Cardiology

## 2014-03-21 DIAGNOSIS — I10 Essential (primary) hypertension: Secondary | ICD-10-CM

## 2014-03-21 DIAGNOSIS — I4891 Unspecified atrial fibrillation: Secondary | ICD-10-CM

## 2014-03-21 NOTE — Telephone Encounter (Signed)
Left message on patient's personal VM for patient regarding change of her appointment to 5/7 with Ileene Hutchinson, PA-C.  I advised patient to call office with questions or concerns.

## 2014-03-21 NOTE — Telephone Encounter (Deleted)
error 

## 2014-03-21 NOTE — Telephone Encounter (Signed)
Please add her on to my schedule on 5/7 at 1:30 PM. I can see her with Dr. Caryl Comes if needed. Thank you, Jerene Pitch

## 2014-03-21 NOTE — Telephone Encounter (Signed)
Patient states her 2nd cardioversion (4/20) is doing well for now. Patient has a scheduled f/u appt with Dr. Caryl Comes for 04/18/14.  She is requesting to be seen earlier if possible. Denies current symptoms.

## 2014-03-21 NOTE — Telephone Encounter (Signed)
New Message  Pt called states that she is on a lot of drugs 2nd cardioversion is holding unlike the first. She states that she is a little shaky in her hands and arms.. Pt states that she is not sure if it comes from the drugs. Made a ppt with Dr. Caryl Comes only per pt for 04/18/2014 at 11:30. Pt requests a call back from the nurse.

## 2014-03-22 ENCOUNTER — Telehealth: Payer: Self-pay | Admitting: Internal Medicine

## 2014-03-22 NOTE — Telephone Encounter (Signed)
New message          Pt has a question about her post op.

## 2014-03-22 NOTE — Telephone Encounter (Signed)
Pt wanting to make sure Dr. Caryl Comes will be in the office when she comes in on 5/7 w/ Brooke Edmisten post DCCV - I explained that he would be. This made her more comfortable. She thanked me and will see [place] in a couple weeks.

## 2014-03-24 ENCOUNTER — Telehealth: Payer: Self-pay | Admitting: Nurse Practitioner

## 2014-03-24 NOTE — Telephone Encounter (Signed)
Pt called to report that her hands are very shaky today.  This has been a problem since going on lasix.  Her wt is also down to 138 from 143 a few days ago.  She says that her mouth was very dry this morning.  I reviewed her previous labs and echo.  She is s/p DCCV on 4/20.  Creat then was 1.5 (prev lower).  She was placed on lasix 40 bid 2/2 worsening lower ext edema while she was in afib.  I rec that she d/c lasix and potassium for now as it sounds as though she is dehydrated.  I encouraged her to increase her PO fluids.  I told her that our office will be in touch on Monday to arrange for f/u bmet and an office appt.  If her wt increases overnight or she develops dyspnea or edema, she should call back in for directions re: lasix/potassium.  She verbalized understanding.

## 2014-03-26 ENCOUNTER — Other Ambulatory Visit (INDEPENDENT_AMBULATORY_CARE_PROVIDER_SITE_OTHER): Payer: Medicare Other

## 2014-03-26 DIAGNOSIS — I10 Essential (primary) hypertension: Secondary | ICD-10-CM

## 2014-03-26 DIAGNOSIS — I4891 Unspecified atrial fibrillation: Secondary | ICD-10-CM

## 2014-03-26 LAB — BASIC METABOLIC PANEL
BUN: 20 mg/dL (ref 6–23)
CHLORIDE: 99 meq/L (ref 96–112)
CO2: 30 mEq/L (ref 19–32)
Calcium: 8.6 mg/dL (ref 8.4–10.5)
Creatinine, Ser: 1 mg/dL (ref 0.4–1.2)
GFR: 58.87 mL/min — ABNORMAL LOW (ref >60.00)
Glucose, Bld: 120 mg/dL — ABNORMAL HIGH (ref 70–99)
Potassium: 4.1 mEq/L (ref 3.5–5.1)
Sodium: 136 mEq/L (ref 135–145)

## 2014-03-26 NOTE — Telephone Encounter (Signed)
Returned call to patient she stated she felt better today.Hands not shakey.No sob.Bmet scheduled for today 03/26/14.Appointment scheduled with Ileene Hutchinson NP 03/26/14 at 9:30 AM.

## 2014-03-26 NOTE — Telephone Encounter (Signed)
New message     Talk with Anne Macias  over the weekend. Was advise that patient need to have blood work . Please read e-mail from Anne Macias.

## 2014-03-27 ENCOUNTER — Encounter: Payer: Medicare Other | Admitting: Internal Medicine

## 2014-03-28 ENCOUNTER — Encounter: Payer: Self-pay | Admitting: Cardiology

## 2014-03-28 ENCOUNTER — Ambulatory Visit (INDEPENDENT_AMBULATORY_CARE_PROVIDER_SITE_OTHER): Payer: Medicare Other | Admitting: Cardiology

## 2014-03-28 VITALS — BP 178/86 | HR 74 | Ht 66.0 in | Wt 142.0 lb

## 2014-03-28 DIAGNOSIS — Z950 Presence of cardiac pacemaker: Secondary | ICD-10-CM

## 2014-03-28 DIAGNOSIS — I4891 Unspecified atrial fibrillation: Secondary | ICD-10-CM | POA: Diagnosis not present

## 2014-03-28 MED ORDER — PROPAFENONE HCL 225 MG PO TABS: 225.0000 mg | ORAL_TABLET | Freq: Two times a day (BID) | ORAL | Status: DC

## 2014-03-28 MED ORDER — METOPROLOL SUCCINATE ER 100 MG PO TB24: 100.0000 mg | ORAL_TABLET | Freq: Two times a day (BID) | ORAL | Status: DC

## 2014-03-28 NOTE — Patient Instructions (Signed)
Your physician recommends that you schedule a follow-up appointment With Dr. Caryl Comes June 9th at 3:45   Your physician recommends that you continue on your current medications as directed. Please refer to the Current Medication list given to you today.

## 2014-03-28 NOTE — Progress Notes (Signed)
ELECTROPHYSIOLOGY OFFICE NOTE   Patient ID: LUGENIA ASSEFA MRN: [id number], DOB/AGE: 78-May-1924   Date of Visit: 03/28/2014  Primary Physician: Lavone Orn, MD Primary Cardiologist: Jolyn Nap, MD Reason for Visit: Hospital follow-up  History of Present Illness  JAHAIRA Macias is a 80 y.o. female with tachy-brady syndrome s/p PPM implant and PAF who underwent DCCV on 03/19/2014. She presents today for follow-up. She called in last week reporting she felt weak and shaky. She was felt to be dehydrated. Lasix and potassium were stopped. Today she reports she is doing well and has no complaints. She has not had any recurrent AFib symptoms. She specifically denies chest pain or shortness of breath. She denies palpitations, dizziness, near syncope or syncope. She denies LE swelling, orthopnea or PND. She is compliant and tolerating medications without difficulty. She has noticed her BP at home is trending high since she was taken off losartan/hctz. She is keeping a BP log and scheduled for follow-up with Dr. Laurann Montana on Monday. Ms. Koehn tells me that she and Dr. Caryl Comes have agreed that her PCP will manage BP.  Past Medical History Past Medical History  Diagnosis Date  . Edema of lower extremity   . PAF (paroxysmal atrial fibrillation) 1998  . Hypertension   . Hyperlipidemia   . Visual disturbance   . Tachy-brady syndrome   . Chronic anticoagulation   . S/P cardiac pacemaker procedure Sept 2774    complicated by pocket hematoma    Past Surgical History Past Surgical History  Procedure Laterality Date  . Insert / replace / remove pacemaker  08/28/2010    implantaton  . Cholecystectomy  2005  . Cataract extraction    . US echocardiography  04/10/2010    EF 55-60%  . Cardioversion N/A 02/26/2014    Procedure: CARDIOVERSION;  Surgeon: Deboraha Sprang, MD;  Location: [facility];  Service: Cardiovascular;  Laterality: N/A;  . Cardioversion N/A 03/19/2014    Procedure: CARDIOVERSION;   Surgeon: Bevin Spark, MD;  Location: [facility] ENDOSCOPY;  Service: Cardiovascular;  Laterality: N/A;    Allergies/Intolerances No Known Allergies  Current Home Medications Current Outpatient Prescriptions  Medication Sig Dispense Refill  . Calcium Carbonate-Vitamin D (CALTRATE 600+D PO) Take 2 tablets by mouth daily.        Marland Kitchen docusate sodium (COLACE) 100 MG capsule Take 100 mg by mouth 2 (two) times daily.      . metoprolol succinate (TOPROL-XL) 100 MG 24 hr tablet Take 1 tablet (100 mg total) by mouth 2 (two) times daily. Take with or immediately following a meal.  180 tablet  2  . Multiple Vitamin (MULTIVITAMIN) tablet Take 1 tablet by mouth daily.        . propafenone (RYTHMOL) 225 MG tablet Take 1 tablet (225 mg total) by mouth 2 (two) times daily.  180 tablet  2  . simvastatin (ZOCOR) 40 MG tablet Take 40 mg by mouth every evening.        . warfarin (COUMADIN) 5 MG tablet Take 5 mg by mouth as directed.         No current facility-administered medications for this visit.    Social History History   Social History  . Marital Status: Married    Spouse Name: N/A    Number of Children: N/A  . Years of Education: N/A   Occupational History  . Not on file.   Social History Main Topics  . Smoking status: Never Smoker   . Smokeless tobacco: Not  on file  . Alcohol Use: No  . Drug Use: No  . Sexual Activity: Not on file   Other Topics Concern  . Not on file   Social History Narrative  . No narrative on file     Review of Systems General: No chills, fever, night sweats or weight changes Cardiovascular: No chest pain, dyspnea on exertion, edema, orthopnea, palpitations, paroxysmal nocturnal dyspnea Dermatological: No rash, lesions or masses Respiratory: No cough, dyspnea Urologic: No hematuria, dysuria Abdominal: No nausea, vomiting, diarrhea, bright red blood per rectum, melena, or hematemesis Neurologic: No visual changes, weakness, changes in mental status All other  systems reviewed and are otherwise negative except as noted above.  Physical Exam Vitals: Blood pressure 178/86, pulse 74, height 5\' 6"  (1.676 m), weight 142 lb (64.411 kg).  General: Well developed, well appearing 81 y.o. female in no acute distress. HEENT: Normocephalic, atraumatic. EOMs intact. Sclera nonicteric. Oropharynx clear.  Neck: Supple. No JVD. Lungs: Respirations regular and unlabored, CTA bilaterally. No wheezes, rales or rhonchi. Heart: RRR. S1, S2 present. No murmurs, rub, S3 or S4. Abdomen: Soft, non-distended.  Extremities: No clubbing, cyanosis or edema. PT/Radials 2+ and equal bilaterally. Psych: Normal affect. Neuro: Alert and oriented X 3. Moves all extremities spontaneously.   Diagnostics Device interrogation today - quick look today for atrial fibrillation - no AT/AF episodes since DCCV on 03/19/2014  Assessment and Plan 1. Paroxysmal atrial fibrillation Maintaining SR since DCCV on 03/19/2014 Continue propafenone and metoprolol Continue warfarin for stroke prevention Continue close follow-up Return for follow-up in 4-6 weeks  Signed, Andrez Grime, PA-C 03/28/2014, 6:08 PM

## 2014-04-02 DIAGNOSIS — I4891 Unspecified atrial fibrillation: Secondary | ICD-10-CM | POA: Diagnosis not present

## 2014-04-02 DIAGNOSIS — R609 Edema, unspecified: Secondary | ICD-10-CM | POA: Diagnosis not present

## 2014-04-02 DIAGNOSIS — I10 Essential (primary) hypertension: Secondary | ICD-10-CM | POA: Diagnosis not present

## 2014-04-05 ENCOUNTER — Encounter: Payer: Medicare Other | Admitting: Cardiology

## 2014-04-11 DIAGNOSIS — B353 Tinea pedis: Secondary | ICD-10-CM | POA: Diagnosis not present

## 2014-04-11 DIAGNOSIS — M7989 Other specified soft tissue disorders: Secondary | ICD-10-CM | POA: Diagnosis not present

## 2014-04-12 DIAGNOSIS — H35369 Drusen (degenerative) of macula, unspecified eye: Secondary | ICD-10-CM | POA: Diagnosis not present

## 2014-04-12 DIAGNOSIS — H43819 Vitreous degeneration, unspecified eye: Secondary | ICD-10-CM | POA: Diagnosis not present

## 2014-04-12 DIAGNOSIS — H353190 Nonexudative age-related macular degeneration, unspecified eye, stage unspecified: Secondary | ICD-10-CM | POA: Diagnosis not present

## 2014-04-13 DIAGNOSIS — K5900 Constipation, unspecified: Secondary | ICD-10-CM | POA: Diagnosis not present

## 2014-04-18 ENCOUNTER — Encounter: Payer: Medicare Other | Admitting: Internal Medicine

## 2014-04-18 DIAGNOSIS — E871 Hypo-osmolality and hyponatremia: Secondary | ICD-10-CM | POA: Diagnosis not present

## 2014-04-18 DIAGNOSIS — I4891 Unspecified atrial fibrillation: Secondary | ICD-10-CM | POA: Diagnosis not present

## 2014-04-18 DIAGNOSIS — Z7901 Long term (current) use of anticoagulants: Secondary | ICD-10-CM | POA: Diagnosis not present

## 2014-04-18 DIAGNOSIS — I10 Essential (primary) hypertension: Secondary | ICD-10-CM | POA: Diagnosis not present

## 2014-04-18 DIAGNOSIS — K5900 Constipation, unspecified: Secondary | ICD-10-CM | POA: Diagnosis not present

## 2014-04-18 DIAGNOSIS — R259 Unspecified abnormal involuntary movements: Secondary | ICD-10-CM | POA: Diagnosis not present

## 2014-04-18 DIAGNOSIS — Z79899 Other long term (current) drug therapy: Secondary | ICD-10-CM | POA: Diagnosis not present

## 2014-04-24 DIAGNOSIS — E871 Hypo-osmolality and hyponatremia: Secondary | ICD-10-CM | POA: Diagnosis not present

## 2014-04-24 DIAGNOSIS — I10 Essential (primary) hypertension: Secondary | ICD-10-CM | POA: Diagnosis not present

## 2014-04-24 DIAGNOSIS — R609 Edema, unspecified: Secondary | ICD-10-CM | POA: Diagnosis not present

## 2014-04-30 DIAGNOSIS — J069 Acute upper respiratory infection, unspecified: Secondary | ICD-10-CM | POA: Diagnosis not present

## 2014-05-08 ENCOUNTER — Encounter: Payer: Self-pay | Admitting: Internal Medicine

## 2014-05-08 ENCOUNTER — Ambulatory Visit (INDEPENDENT_AMBULATORY_CARE_PROVIDER_SITE_OTHER): Payer: Medicare Other | Admitting: Internal Medicine

## 2014-05-08 VITALS — BP 168/80 | HR 71 | Ht 66.5 in | Wt 137.0 lb

## 2014-05-08 DIAGNOSIS — R259 Unspecified abnormal involuntary movements: Secondary | ICD-10-CM

## 2014-05-08 DIAGNOSIS — I495 Sick sinus syndrome: Secondary | ICD-10-CM | POA: Diagnosis not present

## 2014-05-08 DIAGNOSIS — I4891 Unspecified atrial fibrillation: Secondary | ICD-10-CM | POA: Diagnosis not present

## 2014-05-08 DIAGNOSIS — Z950 Presence of cardiac pacemaker: Secondary | ICD-10-CM | POA: Diagnosis not present

## 2014-05-08 DIAGNOSIS — R251 Tremor, unspecified: Secondary | ICD-10-CM

## 2014-05-08 HISTORY — DX: Tremor, unspecified: R25.1

## 2014-05-08 LAB — MDC_IDC_ENUM_SESS_TYPE_INCLINIC
Brady Statistic AS VS Percent: 0.02 %
Brady Statistic RV Percent Paced: 0 %
Lead Channel Impedance Value: 488 Ohm
Lead Channel Impedance Value: 616 Ohm
Lead Channel Pacing Threshold Amplitude: 1 V
Lead Channel Pacing Threshold Amplitude: 1 V
Lead Channel Pacing Threshold Pulse Width: 0.4 ms
Lead Channel Sensing Intrinsic Amplitude: 11.7 mV
Lead Channel Setting Pacing Amplitude: 2 V
Lead Channel Setting Sensing Sensitivity: 0.9 mV
MDC IDC MSMT BATTERY VOLTAGE: 3 V
MDC IDC MSMT LEADCHNL RA PACING THRESHOLD PULSEWIDTH: 0.4 ms
MDC IDC MSMT LEADCHNL RA SENSING INTR AMPL: 3.6 mV
MDC IDC SESS DTM: 20150609170237
MDC IDC SET LEADCHNL RV PACING AMPLITUDE: 2.5 V
MDC IDC SET LEADCHNL RV PACING PULSEWIDTH: 0.4 ms
MDC IDC SET ZONE DETECTION INTERVAL: 350 ms
MDC IDC STAT BRADY AP VP PERCENT: 0 %
MDC IDC STAT BRADY AP VS PERCENT: 99.98 %
MDC IDC STAT BRADY AS VP PERCENT: 0 %
MDC IDC STAT BRADY RA PERCENT PACED: 99.98 %
Zone Setting Detection Interval: 400 ms

## 2014-05-08 MED ORDER — CHLORTHALIDONE 25 MG PO TABS: 25.0000 mg | ORAL_TABLET | ORAL | Status: DC

## 2014-05-08 NOTE — Patient Instructions (Signed)
Your physician has recommended you make the following change in your medication:  1) Chlorthalidone 25 mg every other day  Your physician wants you to follow-up in: 6 months with Dr. Caryl Comes. You will receive a reminder letter in the mail two months in advance. If you don't receive a letter, please call our office to schedule the follow-up appointment.

## 2014-05-08 NOTE — Progress Notes (Signed)
Patient Care Team: Irven Shelling, MD as PCP - General (Internal Medicine)   HPI  Anne Macias is a 80 y.o. female Seen in followup for a pacemaker implanted for tachybradycardia syndrome. She has documented atrial fibrillation and is on warfarin.   She underwent cardioversion last week in the context of flecainide which she was tolerating poorly and we done titrated the dose. Begin her on propafenone and undertook cardioversion again. She sustains sinus rhythm.  Edema got better on a diuretic; she is currently not taking.  She has noticed a little bit of tremor   Echocardiogram 2011 demonstrated normal LV systolic function    Past Medical History  Diagnosis Date  . Edema of lower extremity   . PAF (paroxysmal atrial fibrillation) 1998  . Hypertension   . Hyperlipidemia   . Visual disturbance   . Tachy-brady syndrome   . Chronic anticoagulation   . S/P cardiac pacemaker procedure Sept 0865    complicated by pocket hematoma    Past Surgical History  Procedure Laterality Date  . Insert / replace / remove pacemaker  08/28/2010    implantaton  . Cholecystectomy  2005  . Cataract extraction    . US echocardiography  04/10/2010    EF 55-60%  . Cardioversion N/A 02/26/2014    Procedure: CARDIOVERSION;  Surgeon: Deboraha Sprang, MD;  Location: [place];  Service: Cardiovascular;  Laterality: N/A;  . Cardioversion N/A 03/19/2014    Procedure: CARDIOVERSION;  Surgeon: Quinlee Spark, MD;  Location: [facility] END[facility]COPY;  Service: Cardiovascular;  Laterality: N/A;    Current Outpatient Prescriptions  Medication Sig Dispense Refill  . Calcium Carbonate-Vitamin D (CALTRATE 600+D PO) Take 2 tablets by mouth daily.        Marland Kitchen docusate sodium (COLACE) 100 MG capsule Take 100 mg by mouth 2 (two) times daily.      . metoprolol succinate (TOPROL-XL) 100 MG 24 hr tablet Take 1 tablet (100 mg total) by mouth 2 (two) times daily. Take with or immediately following a meal.  180  tablet  2  . Multiple Vitamin (MULTIVITAMIN) tablet Take 1 tablet by mouth daily.        . propafenone (RYTHMOL) 225 MG tablet Take 1 tablet (225 mg total) by mouth 2 (two) times daily.  180 tablet  2  . simvastatin (ZOCOR) 40 MG tablet Take 40 mg by mouth every evening.        . warfarin (COUMADIN) 5 MG tablet Take 5 mg by mouth as directed.         No current facility-administered medications for this visit.    No Known Allergies  Review of Systems negative except from HPI and PMH  Physical Exam BP 168/80  Pulse 71  Ht 5' 6.5" (1.689 m)  Wt 137 lb (62.143 kg)  BMI 21.78 kg/m2 Well developed and well nourished in no acute distress HENT normal E scleral and icterus clear Neck Supple JVP flat; carotids brisk and full Clear to ausculation  Regular rate and rhythm, no murmurs gallops or rub Soft with active bowel sounds 1+ Edema Alert and oriented, grossly normal motor and sensory function Skin Warm and Dry   ECG demonstrates atrial pacing with intrinsic conduction I rate is 71 Intervals 20/14/42 Bundle branch block  Assessment and  Plan  Atrial fibrillation  Sinus node dysfunction  Pacemaker-[employer] The patient's device was interrogated.  The information was reviewed. No changes were made in the programming.  Edema  Hypertension  Tremor  We will begin her on hydrochlorothiazide;  this will help with her edema and her hypertension. She is to see Dr. Laurann Montana in just about 10 days I will let him modify as he sees fit.  The tremor is likely related to the propafenone  She is holding sinus rhythm. She'll continue on anticoagulation.

## 2014-05-15 ENCOUNTER — Encounter: Payer: Self-pay | Admitting: Internal Medicine

## 2014-05-16 DIAGNOSIS — I10 Essential (primary) hypertension: Secondary | ICD-10-CM | POA: Diagnosis not present

## 2014-05-16 DIAGNOSIS — Z23 Encounter for immunization: Secondary | ICD-10-CM | POA: Diagnosis not present

## 2014-05-16 DIAGNOSIS — E785 Hyperlipidemia, unspecified: Secondary | ICD-10-CM | POA: Diagnosis not present

## 2014-05-16 DIAGNOSIS — Z7901 Long term (current) use of anticoagulants: Secondary | ICD-10-CM | POA: Diagnosis not present

## 2014-05-16 DIAGNOSIS — E871 Hypo-osmolality and hyponatremia: Secondary | ICD-10-CM | POA: Diagnosis not present

## 2014-05-16 DIAGNOSIS — R7309 Other abnormal glucose: Secondary | ICD-10-CM | POA: Diagnosis not present

## 2014-05-16 DIAGNOSIS — I4891 Unspecified atrial fibrillation: Secondary | ICD-10-CM | POA: Diagnosis not present

## 2014-05-23 DIAGNOSIS — I4891 Unspecified atrial fibrillation: Secondary | ICD-10-CM | POA: Diagnosis not present

## 2014-05-23 DIAGNOSIS — Z7901 Long term (current) use of anticoagulants: Secondary | ICD-10-CM | POA: Diagnosis not present

## 2014-06-13 DIAGNOSIS — I4891 Unspecified atrial fibrillation: Secondary | ICD-10-CM | POA: Diagnosis not present

## 2014-06-13 DIAGNOSIS — Z7901 Long term (current) use of anticoagulants: Secondary | ICD-10-CM | POA: Diagnosis not present

## 2014-06-19 ENCOUNTER — Emergency Department (HOSPITAL_COMMUNITY): Payer: Medicare Other

## 2014-06-19 ENCOUNTER — Encounter (HOSPITAL_COMMUNITY): Payer: Self-pay | Admitting: Emergency Medicine

## 2014-06-19 ENCOUNTER — Observation Stay (HOSPITAL_COMMUNITY)
Admission: EM | Admit: 2014-06-19 | Discharge: 2014-06-21 | Disposition: A | Discharge status: Home / Self Care | Payer: Medicare Other | Attending: General Surgery | Admitting: General Surgery

## 2014-06-19 DIAGNOSIS — Z7901 Long term (current) use of anticoagulants: Secondary | ICD-10-CM | POA: Diagnosis not present

## 2014-06-19 DIAGNOSIS — Z950 Presence of cardiac pacemaker: Secondary | ICD-10-CM | POA: Insufficient documentation

## 2014-06-19 DIAGNOSIS — W010XXA Fall on same level from slipping, tripping and stumbling without subsequent striking against object, initial encounter: Secondary | ICD-10-CM | POA: Insufficient documentation

## 2014-06-19 DIAGNOSIS — E785 Hyperlipidemia, unspecified: Secondary | ICD-10-CM | POA: Diagnosis not present

## 2014-06-19 DIAGNOSIS — K5900 Constipation, unspecified: Secondary | ICD-10-CM | POA: Insufficient documentation

## 2014-06-19 DIAGNOSIS — Y9229 Other specified public building as the place of occurrence of the external cause: Secondary | ICD-10-CM | POA: Insufficient documentation

## 2014-06-19 DIAGNOSIS — I10 Essential (primary) hypertension: Secondary | ICD-10-CM | POA: Diagnosis not present

## 2014-06-19 DIAGNOSIS — Z79899 Other long term (current) drug therapy: Secondary | ICD-10-CM | POA: Insufficient documentation

## 2014-06-19 DIAGNOSIS — I495 Sick sinus syndrome: Secondary | ICD-10-CM | POA: Diagnosis not present

## 2014-06-19 DIAGNOSIS — S298XXA Other specified injuries of thorax, initial encounter: Secondary | ICD-10-CM | POA: Diagnosis not present

## 2014-06-19 DIAGNOSIS — M25559 Pain in unspecified hip: Secondary | ICD-10-CM | POA: Diagnosis not present

## 2014-06-19 DIAGNOSIS — I4891 Unspecified atrial fibrillation: Secondary | ICD-10-CM | POA: Insufficient documentation

## 2014-06-19 DIAGNOSIS — D62 Acute posthemorrhagic anemia: Secondary | ICD-10-CM | POA: Diagnosis not present

## 2014-06-19 DIAGNOSIS — S3282XA Multiple fractures of pelvis without disruption of pelvic ring, initial encounter for closed fracture: Secondary | ICD-10-CM | POA: Diagnosis not present

## 2014-06-19 DIAGNOSIS — S79919A Unspecified injury of unspecified hip, initial encounter: Secondary | ICD-10-CM | POA: Diagnosis not present

## 2014-06-19 DIAGNOSIS — S322XXA Fracture of coccyx, initial encounter for closed fracture: Secondary | ICD-10-CM

## 2014-06-19 DIAGNOSIS — S329XXA Fracture of unspecified parts of lumbosacral spine and pelvis, initial encounter for closed fracture: Secondary | ICD-10-CM

## 2014-06-19 DIAGNOSIS — W19XXXA Unspecified fall, initial encounter: Secondary | ICD-10-CM

## 2014-06-19 DIAGNOSIS — S3210XA Unspecified fracture of sacrum, initial encounter for closed fracture: Secondary | ICD-10-CM | POA: Insufficient documentation

## 2014-06-19 DIAGNOSIS — S32810A Multiple fractures of pelvis with stable disruption of pelvic ring, initial encounter for closed fracture: Secondary | ICD-10-CM

## 2014-06-19 DIAGNOSIS — S32509A Unspecified fracture of unspecified pubis, initial encounter for closed fracture: Secondary | ICD-10-CM | POA: Diagnosis not present

## 2014-06-19 LAB — CBC WITH DIFFERENTIAL/PLATELET
Basophils Absolute: 0 10*3/uL (ref 0.0–0.1)
Basophils Relative: 0 % (ref 0–1)
Eosinophils Absolute: 0 10*3/uL (ref 0.0–0.7)
Eosinophils Relative: 0 % (ref 0–5)
HCT: 38.2 % (ref 36.0–46.0)
Hemoglobin: 12.5 g/dL (ref 12.0–15.0)
LYMPHS PCT: 19 % (ref 12–46)
Lymphs Abs: 1.7 10*3/uL (ref 0.7–4.0)
MCH: 28.2 pg (ref 26.0–34.0)
MCHC: 32.7 g/dL (ref 30.0–36.0)
MCV: 86 fL (ref 78.0–100.0)
Monocytes Absolute: 1 10*3/uL (ref 0.1–1.0)
Monocytes Relative: 10 % (ref 3–12)
NEUTROS PCT: 71 % (ref 43–77)
Neutro Abs: 6.4 10*3/uL (ref 1.7–7.7)
PLATELETS: 164 10*3/uL (ref 150–400)
RBC: 4.44 MIL/uL (ref 3.87–5.11)
RDW: 17.8 % — ABNORMAL HIGH (ref 11.5–15.5)
WBC: 9.1 10*3/uL (ref 4.0–10.5)

## 2014-06-19 LAB — BASIC METABOLIC PANEL
Anion gap: 11 (ref 5–15)
BUN: 14 mg/dL (ref 6–23)
CO2: 28 meq/L (ref 19–32)
Calcium: 8.2 mg/dL — ABNORMAL LOW (ref 8.4–10.5)
Chloride: 100 mEq/L (ref 96–112)
Creatinine, Ser: 0.7 mg/dL (ref 0.50–1.10)
GFR calc Af Amer: 86 mL/min — ABNORMAL LOW (ref >90)
GFR, EST NON AFRICAN AMERICAN: 75 mL/min — AB (ref >90)
GLUCOSE: 99 mg/dL (ref 70–99)
POTASSIUM: 4 meq/L (ref 3.7–5.3)
SODIUM: 139 meq/L (ref 137–147)

## 2014-06-19 LAB — PROTIME-INR
INR: 2.39 — ABNORMAL HIGH (ref 0.00–1.49)
Prothrombin Time: 26.1 seconds — ABNORMAL HIGH (ref 11.6–15.2)

## 2014-06-19 MED ORDER — OXYCODONE-ACETAMINOPHEN 5-325 MG PO TABS
1.0000 | ORAL_TABLET | Freq: Once | ORAL | Status: AC
  Administered 2014-06-19: 1 via ORAL
  Filled 2014-06-19: qty 1

## 2014-06-19 MED ORDER — PROPAFENONE HCL 225 MG PO TABS
225.0000 mg | ORAL_TABLET | Freq: Two times a day (BID) | ORAL | Status: DC
  Administered 2014-06-19 – 2014-06-21 (×4): 225 mg via ORAL
  Filled 2014-06-19 (×5): qty 1

## 2014-06-19 MED ORDER — SODIUM CHLORIDE 0.9 % IV SOLN
INTRAVENOUS | Status: DC
  Administered 2014-06-19: 23:00:00 via INTRAVENOUS

## 2014-06-19 MED ORDER — MORPHINE SULFATE 2 MG/ML IJ SOLN: 2.0000 mg | INTRAMUSCULAR | Status: DC | PRN

## 2014-06-19 MED ORDER — ONDANSETRON HCL 4 MG PO TABS: 4.0000 mg | ORAL_TABLET | Freq: Four times a day (QID) | ORAL | Status: DC | PRN

## 2014-06-19 MED ORDER — ACETAMINOPHEN 325 MG PO TABS: 650.0000 mg | ORAL_TABLET | ORAL | Status: DC | PRN

## 2014-06-19 MED ORDER — METOPROLOL SUCCINATE ER 100 MG PO TB24
100.0000 mg | ORAL_TABLET | Freq: Every day | ORAL | Status: DC
  Administered 2014-06-19: 100 mg via ORAL
  Filled 2014-06-19 (×2): qty 1

## 2014-06-19 MED ORDER — OXYCODONE HCL 5 MG PO TABS
5.0000 mg | ORAL_TABLET | ORAL | Status: DC | PRN
  Administered 2014-06-20: 5 mg via ORAL
  Filled 2014-06-19: qty 1

## 2014-06-19 MED ORDER — ONDANSETRON HCL 4 MG/2ML IJ SOLN: 4.0000 mg | Freq: Four times a day (QID) | INTRAMUSCULAR | Status: DC | PRN

## 2014-06-19 MED ORDER — OXYCODONE HCL 5 MG PO TABS: 10.0000 mg | ORAL_TABLET | ORAL | Status: DC | PRN

## 2014-06-19 MED ORDER — SODIUM CHLORIDE 0.9 % IV BOLUS (SEPSIS): 1000.0000 mL | Freq: Once | INTRAVENOUS | Status: DC

## 2014-06-19 NOTE — ED Notes (Signed)
Md Horton at bedside.

## 2014-06-19 NOTE — ED Notes (Signed)
Md Donne Hazel at bedside discussing the plan of care.

## 2014-06-19 NOTE — ED Provider Notes (Signed)
CSN: [id number]     Arrival date & time 06/19/14  1214 History   First MD Initiated Contact with Patient 06/19/14 1445     Chief Complaint  Patient presents with  . Hip Pain     (Consider location/radiation/quality/duration/timing/severity/associated sxs/prior Treatment) HPI  This is an 80 year old female with a history of paroxysmal atrial fibrillation, hypertension, hyperlipidemia on chronic anticoagulation who presents following a fall. Patient reports that she tripped and fell. She denies any loss of consciousness or hitting her head. She denies any syncope. She is endorsing right hip and pelvic pain.  She reports that her pain is 0/10 when she is not moving. She's currently on Coumadin. Patient denies any other injury. She denies any chest pain, shortness of breath, abdominal pain.  Past Medical History  Diagnosis Date  . Edema of lower extremity   . PAF (paroxysmal atrial fibrillation) 1998  . Hypertension   . Hyperlipidemia   . Visual disturbance   . Tachy-brady syndrome   . Chronic anticoagulation   . S/P cardiac pacemaker procedure Sept 8185    complicated by pocket hematoma  . Tremor 05/08/2014   Past Surgical History  Procedure Laterality Date  . Insert / replace / remove pacemaker  08/28/2010    implantaton  . Cholecystectomy  2005  . Cataract extraction    . US echocardiography  04/10/2010    EF 55-60%  . Cardioversion N/A 02/26/2014    Procedure: CARDIOVERSION;  Surgeon: Deboraha Sprang, MD;  Location: [place];  Service: Cardiovascular;  Laterality: N/A;  . Cardioversion N/A 03/19/2014    Procedure: CARDIOVERSION;  Surgeon: Iyana Spark, MD;  Location: [facility]OSCOPY;  Service: Cardiovascular;  Laterality: N/A;   Family History  Problem Relation Age of Onset  . Heart attack Mother   . Pneumonia Father   . Heart attack Brother   . Alcohol abuse Brother    History  Substance Use Topics  . Smoking status: Never Smoker   . Smokeless tobacco: Not on file   . Alcohol Use: No   OB History   Grav Para Term Preterm Abortions TAB SAB Ect Mult Living                 Review of Systems  Constitutional: Negative for fever.  Respiratory: Negative for chest tightness and shortness of breath.   Cardiovascular: Negative for chest pain.  Gastrointestinal: Negative for nausea, vomiting and abdominal pain.  Genitourinary: Negative for dysuria.  Musculoskeletal: Negative for back pain.       Left hip and pelvis pain  Skin: Negative for wound.  Neurological: Negative for headaches.  Psychiatric/Behavioral: Negative for confusion.  All other systems reviewed and are negative.     Allergies  Review of patient's allergies indicates no known allergies.  Home Medications   Prior to Admission medications   Medication Sig Start Date End Date Taking? Authorizing Provider  Calcium Carbonate-Vitamin D (CALTRATE 600+D PO) Take 2 tablets by mouth daily.     Yes Historical Provider, MD  docusate sodium (COLACE) 100 MG capsule Take 100 mg by mouth 2 (two) times daily.   Yes Historical Provider, MD  metoprolol succinate (TOPROL-XL) 100 MG 24 hr tablet Take 1 tablet (100 mg total) by mouth 2 (two) times daily. Take with or immediately following a meal. 03/28/14  Yes Brooke O Edmisten, PA-C  propafenone (RYTHMOL) 225 MG tablet Take 1 tablet (225 mg total) by mouth 2 (two) times daily. 03/28/14  Yes Andrez Grime, PA-C  simvastatin (ZOCOR) 40 MG tablet Take 40 mg by mouth every evening.     Yes Historical Provider, MD  warfarin (COUMADIN) 5 MG tablet Take 5 mg by mouth as directed.    Yes Historical Provider, MD   BP 148/69  Pulse 70  Temp(Src) 97.9 F (36.6 C) (Oral)  Resp 16  SpO2 97% Physical Exam  Nursing note and vitals reviewed. Constitutional: She is oriented to person, place, and time. She appears well-developed and well-nourished. No distress.  HENT:  Head: Normocephalic and atraumatic.  Mouth/Throat: Oropharynx is clear and moist.  Eyes:  Pupils are equal, round, and reactive to light.  Neck: Normal range of motion.  Cardiovascular: Normal rate, regular rhythm and normal heart sounds.   No murmur heard. Pulmonary/Chest: Effort normal and breath sounds normal. No respiratory distress. She has no wheezes.  Abdominal: Soft. Bowel sounds are normal. There is no tenderness. There is no rebound.  Musculoskeletal:  Normal range of motion of the bilateral hips and knees, pain with range of motion of the left hip, no foreshortening noted  Neurological: She is alert and oriented to person, place, and time.  Skin: Skin is warm and dry.  No wound is noted  Psychiatric: She has a normal mood and affect.    ED Course  Procedures (including critical care time) Labs Review Labs Reviewed  PROTIME-INR - Abnormal; Notable for the following:    Prothrombin Time 26.1 (*)    INR 2.39 (*)    All other components within normal limits  CBC WITH DIFFERENTIAL - Abnormal; Notable for the following:    RDW 17.8 (*)    All other components within normal limits  BASIC METABOLIC PANEL  URINALYSIS, ROUTINE W REFLEX MICROSCOPIC    Imaging Review Dg Hip Complete Left  06/19/2014   CLINICAL DATA:  Fall.  LEFT hip pain fall.  Hip pain.  HIP PAIN  EXAM: LEFT HIP - COMPLETE 2+ VIEW  COMPARISON:  None.  FINDINGS: Acute appearing LEFT obturator ring fractures are present with complementary LEFT sacral ala fracture. The parasymphyseal pubis is inferiorly displaced 7 mm along LEFT iliopubic line. Sclerotic lesion in is present in the LEFT supra-acetabular ilium measuring 14 mm, compatible with a benign bone island. Atherosclerosis is noted. The proximal LEFT femur appears intact hip joint spaces appear preserved and symmetric. Partially visualized lumbar spondylosis. The RIGHT obturator ring and RIGHT sacral ala appear intact.  IMPRESSION: Acute mildly displaced LEFT obturator ring fractures with complementary of LEFT sacral ala fracture. Intact proximal LEFT  femur.   Electronically Signed   By: Dereck Ligas M.D.   On: 06/19/2014 13:35   Ct Pelvis Wo Contrast  06/19/2014   CLINICAL DATA:  Hip pain after a fall.  EXAM: CT PELVIS WITHOUT CONTRAST  TECHNIQUE: Multidetector CT imaging of the pelvis was performed following the standard protocol without intravenous contrast.  COMPARISON:  Radiograph of earlier today.  FINDINGS: Soft tissues: Moderate amount of colonic stool. Normal pelvic small bowel loops and image portion of the appendix. No intraperitoneal fluid.  Pelvic atherosclerosis. No pelvic adenopathy. Uterine atrophy. Urinary bladder displaced to the right by extraperitoneal hemorrhage/hematoma. Extraperitoneal hematoma surrounding the left parasymphyseal pubic bone fractures. Example image 57/ series 3.  There is also edema superficial of the left greater trochanter of the femur.  Bones:  No proximal femur fracture.  Left acetabular bone island.  Comminuted left parasymphyseal pubic bone fracture. Minimally displaced left inferior pubic ramus fracture.  Minimally displaced left sacral fractures, with  extension into the left sacroiliac joint. No definite sacral ala involvement, although osteopenia degrades evaluation in this area.  L4-5 and L5-S1 degenerative disc disease.  IMPRESSION: 1. Comminuted left parasymphyseal pubic bone fracture with minimally displaced inferior left pubic ramus fracture. Extraperitoneal hematoma adjacent the pubic bone fracture. This displaces the urinary bladder. Cannot exclude bladder injury. Correlate with symptoms and consider CT cystogram. 2. A minimally displaced left sacral fracture. 3. No evidence of proximal femur fracture. 4. Osteopenia.   Electronically Signed   By: Abigail Miyamoto M.D.   On: 06/19/2014 17:13     EKG Interpretation None      MDM   Final diagnoses:  Multiple pelvic fractures, closed, initial encounter    This is a very pleasant 80 year old female on Coumadin who presents following a fall. She  reports left hip and pelvis pain.  X-ray show obturator ring fractures and a sacral fracture. She is otherwise nontoxic-appearing. Vital signs are stable and ABCs are intact. INR was obtained and orthopedics was consulted. Dr. Marcelino Scot is requesting CT scan of the pelvis to further evaluate fractures. Patient has declined pain medication at this time.  CT scan shows known fractures from x-ray. Patient does have a hematoma adjacent to one of the fractures. There is concern for mass effect on the bladder and cannot rule out bladder injury. Patient has been able to spontaneously void.  I have updated the family at the bedside including the patient's son who is a physician here. Trauma surgery consulted given the hematoma on CT scan and basic labwork obtained. Dr. Donne Hazel has evaluated the patient at the bedside. Given patient is on anticoagulation and will be a fall risk given pain secondary to fractures. Given this, Dr. Donne Hazel will admit for observation and physical therapy.    Merryl Hacker, MD 06/19/14 [id number]

## 2014-06-19 NOTE — ED Notes (Addendum)
She tripped and fell onto her L hip. She c/o pain in the hip with movement since. She states it does not hurt unless she moves it. Skin w/d, skin color wnl, can wiggle digits. She denies any other complaints or injuries.

## 2014-06-19 NOTE — Progress Notes (Signed)
  CARE MANAGEMENT ED NOTE 06/19/2014  Patient:  Anne Macias, Anne Macias   Account Number:  [id number]  Date Initiated:  06/19/2014  Documentation initiated by:  [facility], [place]  Subjective/Objective Assessment:   Pt presented to [facility] ED s/p mechanical fall with hip pain. Has nonoperative pelvic fractures     Subjective/Objective Assessment Detail:     Action/Plan:   Return home with [facility] PT   Action/Plan Detail:   Pt being admitted on OBS  Return Home with [facility] PT services.   Anticipated DC Date:       Status Recommendation to Physician:   Result of Recommendation:  Agreed  Other ED Services  Consult Working [place]  CM consult    Choice offered to / List presented to:  C-1 Patient          Status of service:  In process, will continue to follow  ED Comments:   ED Comments Detail:  06/19/2014 18:35 Wendi Maya RN BSN [phone] Trauma Consult done, recommendation for Observation. Patient agreeable. Unit CM will continue to f/u for final disposition plan.   06/19/2014 18:01 Wendi Maya RN BSN [phone] ED CM consulted by Dr. Dina Rich concerning possible [facility] services. Pt presented to [facility] ED s/p mechanical fall. ED eval Labs unremarkable, CXR no significant finding ,CT  Pelvis showed Comminuted left parasymphyseal pubic bone fracture with minimally displaced inferior left pubic ramus fracture. Extraperitoneal hematoma adjacent the pubic bone fracture. This displaces the urinary bladder. Cannot exclude bladder injury. Trauma Surgeon consult ordered and pending. Noted payor source as Medicare. Met patient at bedside, confirmed information. Patient lives at home with husband. Discussed the possible recommendation for [facility] service. Pt would be agreeable. Offered choice, provided [place] list.  Pt selected  [facility] as her choice for [facility] upon discharge, provided [facility] brochure.  Pt reports having a rolling walker at home. CM will continue to follow with discharge plan.

## 2014-06-19 NOTE — ED Notes (Signed)
Md Horton at bedside discussing CT results.

## 2014-06-19 NOTE — H&P (Signed)
Anne Macias is an 80 y.o. female.   Chief Complaint: s/p fall HPI: 38 yof who is overall very healthy and active was with her husband Rush Landmark at [place] today when she tripped and fell. No LOC, remembers entire event. She broke her fall with her arm trying to stop.  She was unable to stand afterwards and required a wheelchair.  She had pain suprapubic/pelvis.  No other complaints. Was 2 person assist to void while in er.  She has voided and felt normal to her. Does not know appearance.  Has undergone ct scan pelvis that shows rami and sacral ala fx.    Past Medical History  Diagnosis Date  . Edema of lower extremity   . PAF (paroxysmal atrial fibrillation) 1998  . Hypertension   . Hyperlipidemia   . Visual disturbance   . Tachy-brady syndrome   . Chronic anticoagulation   . S/P cardiac pacemaker procedure Sept 8921    complicated by pocket hematoma  . Tremor 05/08/2014    Past Surgical History  Procedure Laterality Date  . Insert / replace / remove pacemaker  08/28/2010    implantaton  . Cholecystectomy  2005  . Cataract extraction    . US echocardiography  04/10/2010    EF 55-60%  . Cardioversion N/A 02/26/2014    Procedure: CARDIOVERSION;  Surgeon: Deboraha Sprang, MD;  Location: [place];  Service: Cardiovascular;  Laterality: N/A;  . Cardioversion N/A 03/19/2014    Procedure: CARDIOVERSION;  Surgeon: Khaliah Spark, MD;  Location: [facility] ENDOSCOPY;  Service: Cardiovascular;  Laterality: N/A;    Family History  Problem Relation Age of Onset  . Heart attack Mother   . Pneumonia Father   . Heart attack Brother   . Alcohol abuse Brother    Social History:  reports that she has never smoked. She does not have any smokeless tobacco history on file. She reports that she does not drink alcohol or use illicit drugs.  Allergies: No Known Allergies  Med warfarin, toprol xl, rhythmol  Results for orders placed during the hospital encounter of 06/19/14 (from the past 48 hour(s))   PROTIME-INR     Status: Abnormal   Collection Time    06/19/14  3:34 PM      Result Value Ref Range   Prothrombin Time 26.1 (*) 11.6 - 15.2 seconds   INR 2.39 (*) 0.00 - 1.49  CBC WITH DIFFERENTIAL     Status: Abnormal   Collection Time    06/19/14  5:37 PM      Result Value Ref Range   WBC 9.1  4.0 - 10.5 K/uL   RBC 4.44  3.87 - 5.11 MIL/uL   Hemoglobin 12.5  12.0 - 15.0 g/dL   HCT 38.2  36.0 - 46.0 %   MCV 86.0  78.0 - 100.0 fL   MCH 28.2  26.0 - 34.0 pg   MCHC 32.7  30.0 - 36.0 g/dL   RDW 17.8 (*) 11.5 - 15.5 %   Platelets 164  150 - 400 K/uL   Neutrophils Relative % 71  43 - 77 %   Neutro Abs 6.4  1.7 - 7.7 K/uL   Lymphocytes Relative 19  12 - 46 %   Lymphs Abs 1.7  0.7 - 4.0 K/uL   Monocytes Relative 10  3 - 12 %   Monocytes Absolute 1.0  0.1 - 1.0 K/uL   Eosinophils Relative 0  0 - 5 %   Eosinophils Absolute 0.0  0.0 -  0.7 K/uL   Basophils Relative 0  0 - 1 %   Basophils Absolute 0.0  0.0 - 0.1 K/uL   Dg Hip Complete Left  06/19/2014   CLINICAL DATA:  Fall.  LEFT hip pain fall.  Hip pain.  HIP PAIN  EXAM: LEFT HIP - COMPLETE 2+ VIEW  COMPARISON:  None.  FINDINGS: Acute appearing LEFT obturator ring fractures are present with complementary LEFT sacral ala fracture. The parasymphyseal pubis is inferiorly displaced 7 mm along LEFT iliopubic line. Sclerotic lesion in is present in the LEFT supra-acetabular ilium measuring 14 mm, compatible with a benign bone island. Atherosclerosis is noted. The proximal LEFT femur appears intact hip joint spaces appear preserved and symmetric. Partially visualized lumbar spondylosis. The RIGHT obturator ring and RIGHT sacral ala appear intact.  IMPRESSION: Acute mildly displaced LEFT obturator ring fractures with complementary of LEFT sacral ala fracture. Intact proximal LEFT femur.   Electronically Signed   By: Dereck Ligas M.D.   On: 06/19/2014 13:35   Ct Pelvis Wo Contrast  06/19/2014   CLINICAL DATA:  Hip pain after a fall.  EXAM: CT  PELVIS WITHOUT CONTRAST  TECHNIQUE: Multidetector CT imaging of the pelvis was performed following the standard protocol without intravenous contrast.  COMPARISON:  Radiograph of earlier today.  FINDINGS: Soft tissues: Moderate amount of colonic stool. Normal pelvic small bowel loops and image portion of the appendix. No intraperitoneal fluid.  Pelvic atherosclerosis. No pelvic adenopathy. Uterine atrophy. Urinary bladder displaced to the right by extraperitoneal hemorrhage/hematoma. Extraperitoneal hematoma surrounding the left parasymphyseal pubic bone fractures. Example image 57/ series 3.  There is also edema superficial of the left greater trochanter of the femur.  Bones:  No proximal femur fracture.  Left acetabular bone island.  Comminuted left parasymphyseal pubic bone fracture. Minimally displaced left inferior pubic ramus fracture.  Minimally displaced left sacral fractures, with extension into the left sacroiliac joint. No definite sacral ala involvement, although osteopenia degrades evaluation in this area.  L4-5 and L5-S1 degenerative disc disease.  IMPRESSION: 1. Comminuted left parasymphyseal pubic bone fracture with minimally displaced inferior left pubic ramus fracture. Extraperitoneal hematoma adjacent the pubic bone fracture. This displaces the urinary bladder. Cannot exclude bladder injury. Correlate with symptoms and consider CT cystogram. 2. A minimally displaced left sacral fracture. 3. No evidence of proximal femur fracture. 4. Osteopenia.   Electronically Signed   By: Abigail Miyamoto M.D.   On: 06/19/2014 17:13    Review of Systems  Constitutional: Negative for fever and chills.  Eyes: Negative for blurred vision and double vision.  Respiratory: Negative for shortness of breath.   Cardiovascular: Negative for chest pain.  Gastrointestinal: Negative for abdominal pain.  Genitourinary: Negative for dysuria, urgency, frequency and flank pain.  Musculoskeletal: Negative for back pain and  neck pain.  Neurological: Negative for loss of consciousness and headaches.    Blood pressure 148/69, pulse 70, temperature 97.9 F (36.6 C), temperature source Oral, resp. rate 16, SpO2 97.00%. Physical Exam  Vitals reviewed. Constitutional: She is oriented to person, place, and time. She appears well-developed and well-nourished.  HENT:  Head: Normocephalic and atraumatic.  Right Ear: External ear normal.  Left Ear: External ear normal.  Mouth/Throat: Oropharynx is clear and moist.  Eyes: Conjunctivae and EOM are normal. Pupils are equal, round, and reactive to light. No scleral icterus.  Neck: Normal range of motion. Neck supple. No JVD present.  Cardiovascular: Normal rate, regular rhythm, normal heart sounds and intact distal pulses.  Respiratory: Effort normal and breath sounds normal. She has no wheezes. She exhibits no tenderness.  GI: Soft. There is no tenderness.  Musculoskeletal: Normal range of motion. She exhibits no edema and no tenderness.  Some mild suprapubic tenderness   Lymphadenopathy:    She has no cervical adenopathy.  Neurological: She is alert and oriented to person, place, and time.  Skin: Skin is warm and dry.     Assessment/Plan S/p fall  Low level mechanism with ground level fall. Has nonoperative pelvic fractures.  I dont think especially on coumadin safe to go home. I recommended admission, pain control, evaluation by physical therapy in am to see how we can safely discharge her to home. She has steps leading into home.  Will also make sure she is voiding completely with bladder scan. I don't think she has bladder injury and mechanism/ct scan c/w extraperitoneal hematoma from fractures. Will forgo ct cystogram.  Will hold coumadin tonight but not actively reverse her. Recheck labs in am tomorrow. I discussed plan with patient, her husband and her son Dr Crissie Reese.  Celinda Dethlefs 06/19/2014, 6:25 PM

## 2014-06-20 DIAGNOSIS — Z7901 Long term (current) use of anticoagulants: Secondary | ICD-10-CM

## 2014-06-20 DIAGNOSIS — D62 Acute posthemorrhagic anemia: Secondary | ICD-10-CM | POA: Diagnosis not present

## 2014-06-20 DIAGNOSIS — S32509A Unspecified fracture of unspecified pubis, initial encounter for closed fracture: Secondary | ICD-10-CM | POA: Diagnosis not present

## 2014-06-20 DIAGNOSIS — S329XXA Fracture of unspecified parts of lumbosacral spine and pelvis, initial encounter for closed fracture: Secondary | ICD-10-CM | POA: Diagnosis not present

## 2014-06-20 DIAGNOSIS — E785 Hyperlipidemia, unspecified: Secondary | ICD-10-CM | POA: Insufficient documentation

## 2014-06-20 DIAGNOSIS — W19XXXA Unspecified fall, initial encounter: Secondary | ICD-10-CM

## 2014-06-20 LAB — BASIC METABOLIC PANEL
Anion gap: 8 (ref 5–15)
BUN: 17 mg/dL (ref 6–23)
CHLORIDE: 101 meq/L (ref 96–112)
CO2: 29 mEq/L (ref 19–32)
CREATININE: 0.83 mg/dL (ref 0.50–1.10)
Calcium: 7.8 mg/dL — ABNORMAL LOW (ref 8.4–10.5)
GFR calc Af Amer: 70 mL/min — ABNORMAL LOW (ref >90)
GFR calc non Af Amer: 61 mL/min — ABNORMAL LOW (ref >90)
Glucose, Bld: 98 mg/dL (ref 70–99)
Potassium: 4.2 mEq/L (ref 3.7–5.3)
Sodium: 138 mEq/L (ref 137–147)

## 2014-06-20 LAB — CBC
HEMATOCRIT: 32.7 % — AB (ref 36.0–46.0)
Hemoglobin: 10.7 g/dL — ABNORMAL LOW (ref 12.0–15.0)
MCH: 28.7 pg (ref 26.0–34.0)
MCHC: 32.7 g/dL (ref 30.0–36.0)
MCV: 87.7 fL (ref 78.0–100.0)
PLATELETS: 150 10*3/uL (ref 150–400)
RBC: 3.73 MIL/uL — AB (ref 3.87–5.11)
RDW: 18 % — ABNORMAL HIGH (ref 11.5–15.5)
WBC: 5.3 10*3/uL (ref 4.0–10.5)

## 2014-06-20 MED ORDER — POLYETHYLENE GLYCOL 3350 17 G PO PACK
17.0000 g | PACK | Freq: Every day | ORAL | Status: DC
  Administered 2014-06-20 – 2014-06-21 (×2): 17 g via ORAL
  Filled 2014-06-20 (×3): qty 1

## 2014-06-20 MED ORDER — MORPHINE SULFATE 2 MG/ML IJ SOLN: 2.0000 mg | INTRAMUSCULAR | Status: DC | PRN

## 2014-06-20 MED ORDER — METOPROLOL SUCCINATE ER 100 MG PO TB24
100.0000 mg | ORAL_TABLET | Freq: Two times a day (BID) | ORAL | Status: DC
  Administered 2014-06-20 – 2014-06-21 (×3): 100 mg via ORAL
  Filled 2014-06-20 (×4): qty 1

## 2014-06-20 MED ORDER — SIMVASTATIN 40 MG PO TABS
40.0000 mg | ORAL_TABLET | Freq: Every evening | ORAL | Status: DC
  Administered 2014-06-20: 40 mg via ORAL
  Filled 2014-06-20 (×2): qty 1

## 2014-06-20 MED ORDER — TRAMADOL HCL 50 MG PO TABS
50.0000 mg | ORAL_TABLET | Freq: Four times a day (QID) | ORAL | Status: DC | PRN
  Administered 2014-06-20: 50 mg via ORAL
  Filled 2014-06-20: qty 1

## 2014-06-20 MED ORDER — DOCUSATE SODIUM 100 MG PO CAPS
200.0000 mg | ORAL_CAPSULE | Freq: Two times a day (BID) | ORAL | Status: DC
  Administered 2014-06-20 – 2014-06-21 (×3): 200 mg via ORAL
  Filled 2014-06-20 (×3): qty 2

## 2014-06-20 NOTE — Progress Notes (Signed)
Spoke with pt and husband in room and they chose [place] for HHPT and will have RW and 3-in-1 delivered from [place] to the pt's room for d/c home tomorrow.   Sandi Mariscal, RN BSN [place] CCM Trauma/Neuro ICU Case Manager [phone]

## 2014-06-20 NOTE — Progress Notes (Signed)
Will hold Coumadin again.  Okay to work with PT and OT.  This patient has been seen and I agree with the findings and treatment plan.  Kathryne Eriksson. Dahlia Bailiff, MD, [place] [phone] ([phone] (direct pager) Trauma Surgeon

## 2014-06-20 NOTE — Progress Notes (Signed)
Patient ID: Anne Macias, female   DOB: 06-17-1923, 80 y.o.   MRN: [id number]   LOS: 1 day   Subjective: Doing pretty well, denies N/V/excessive pain. Concerned about constipation (which she suffers from chronically)   Objective: Vital signs in last 24 hours: Temp:  [97.5 F (36.4 C)-98.6 F (37 C)] 98.6 F (37 C) (07/22 0545) Pulse Rate:  [69-74] 70 (07/22 0545) Resp:  [16] 16 (07/22 0545) BP: (104-159)/(40-69) 123/49 mmHg (07/22 0545) SpO2:  [97 %-99 %] 97 % (07/22 0545) Last BM Date: 06/18/14   Laboratory  CBC  Recent Labs  06/19/14 1737 06/20/14 0610  WBC 9.1 5.3  HGB 12.5 10.7*  HCT 38.2 32.7*  PLT 164 150   BMET  Recent Labs  06/19/14 1737 06/20/14 0610  NA 139 138  K 4.0 4.2  CL 100 101  CO2 28 29  GLUCOSE 99 98  BUN 14 17  CREATININE 0.70 0.83  CALCIUM 8.2* 7.8*    Physical Exam General appearance: alert and no distress Resp: clear to auscultation bilaterally Cardio: irregularly irregular rhythm GI: normal findings: bowel sounds normal and soft, non-tender   Assessment/Plan: Fall Multiple pelvic fxs -- WBAT ABL anemia -- Mild, check in am Multiple medical problems -- Home meds FEN -- Add bowel regimen. Non-narcotic analgesia given age, constipation issues. SL IV. VTE -- SCD's Dispo -- PT/OT    Lisette Abu, PA-C Pager: [phone] General Trauma PA Pager: [phone]  06/20/2014

## 2014-06-20 NOTE — Progress Notes (Signed)
Orthopaedic Trauma Service (OTS)  Subjective: Patient reports pain as mild.   Has not been up with PT yet this am.  Objective: Current Vitals Blood pressure 123/49, pulse 70, temperature 98.6 F (37 C), temperature source Oral, resp. rate 16, SpO2 97.00%. Vital signs in last 24 hours: Temp:  [97.5 F (36.4 C)-98.6 F (37 C)] 98.6 F (37 C) (07/22 0545) Pulse Rate:  [69-74] 70 (07/22 0545) Resp:  [16] 16 (07/22 0545) BP: (104-159)/(40-69) 123/49 mmHg (07/22 0545) SpO2:  [97 %-99 %] 97 % (07/22 0545)   LABS  Recent Labs  06/19/14 1737 06/20/14 0610  HGB 12.5 10.7*    Recent Labs  06/19/14 1737 06/20/14 0610  WBC 9.1 5.3  RBC 4.44 3.73*  HCT 38.2 32.7*  PLT 164 150    Recent Labs  06/19/14 1737 06/20/14 0610  NA 139 138  K 4.0 4.2  CL 100 101  CO2 28 29  BUN 14 17  CREATININE 0.70 0.83  GLUCOSE 99 98  CALCIUM 8.2* 7.8*    Recent Labs  06/19/14 1534  INR 2.39*     Physical Exam  No changes this am from last night.   Assessment/Plan: Labs stable re INR and HCT at 33 though a significant drop in latter from yesterday.  Up with PT, OT using WBAT; will follow up later today to gauge progress DME arrangements  Altamese [place], MD [facility] [phone] (p)   06/20/2014, 8:58 AM

## 2014-06-20 NOTE — Evaluation (Signed)
Physical Therapy Evaluation Patient Details Name: Anne Macias MRN: [id number] DOB: [birth year] Today's Date: 06/20/2014   History of Present Illness  80 y.o. female fell at [facility] with inability to bear weight afterward. Pt Has nonoperative pelvic fractures.  Clinical Impression  Pt adm from home due to the above. Pt presents with decreased independence in functional mobility secondary to deficits indicated below. Pt to benefit from skilled acute PT to address mobility deficits and maximize independence prior to returning home. Pt may need to address stair mobility; pending if son can arrange for temporary ramp prior to D/C.     Follow Up Recommendations Home health PT;Supervision/Assistance - 24 hour    Equipment Recommendations  Rolling walker with 5" wheels;3in1 (PT)    Recommendations for Other Services OT consult     Precautions / Restrictions Precautions Precautions: Fall Restrictions Weight Bearing Restrictions: Yes LLE Weight Bearing: Weight bearing as tolerated      Mobility  Bed Mobility Overal bed mobility: Needs Assistance Bed Mobility: Supine to Sit     Supine to sit: Min assist;HOB elevated     General bed mobility comments: cues for sequencing; (A) to initiate advancement of Lt LE but was able to progress to advancing it after initial facilitation; cues for hand placement and sequencing; incr time due to anxiety regarding moving Lt LE   Transfers Overall transfer level: Needs assistance Equipment used: Rolling walker (2 wheeled) Transfers: Sit to/from Stand Sit to Stand: Min assist         General transfer comment: min (A) to maintain balance when transitioning hands onto RW; cues for sequencing and safety with RW   Ambulation/Gait Ambulation/Gait assistance: Min guard Ambulation Distance (Feet): 60 Feet Assistive device: Rolling walker (2 wheeled) Gait Pattern/deviations: Step-through pattern;Decreased step length - right;Decreased stance time  - left;Trunk flexed;Narrow base of support Gait velocity: decreased Gait velocity interpretation: Below normal speed for age/gender General Gait Details: cues for upright posture and proper gt sequencing; min guard to steady and balance   Stairs            Wheelchair Mobility    Modified Rankin (Stroke Patients Only)       Balance Overall balance assessment: Needs assistance Sitting-balance support: Feet supported;No upper extremity supported Sitting balance-Leahy Scale: Good Sitting balance - Comments: pt sitting EOB; no C/o dizziness;     Standing balance-Leahy Scale: Fair Standing balance comment: able to stand minimal amount of time without UE support             High level balance activites: Direction changes High Level Balance Comments: min guard to steady             Pertinent Vitals/Pain Denies any pain     Home Living Family/patient expects to be discharged to:: Private residence Living Arrangements: Spouse/significant other Available Help at Discharge: Family;Available 24 hours/day Type of Home: House Home Access: Stairs to enter Entrance Stairs-Rails: Psychiatric nurse of Steps: 4 Home Layout: One level Home Equipment: None Additional Comments: reports she may have temporary ramp for entering house upon D/C    Prior Function Level of Independence: Independent               Hand Dominance        Extremity/Trunk Assessment   Upper Extremity Assessment: Defer to OT evaluation           Lower Extremity Assessment: LLE deficits/detail   LLE Deficits / Details: Lt quad 3+/5; hip 3-/5 limited by pain  Cervical / Trunk Assessment: Normal  Communication   Communication: No difficulties  Cognition Arousal/Alertness: Awake/alert Behavior During Therapy: WFL for tasks assessed/performed Overall Cognitive Status: Within Functional Limits for tasks assessed                      General Comments       Exercises General Exercises - Lower Extremity Ankle Circles/Pumps: AROM;Both;10 reps;Supine Long Arc Quad: AROM;Both;Strengthening;10 reps;Seated      Assessment/Plan    PT Assessment Patient needs continued PT services  PT Diagnosis Generalized weakness;Abnormality of gait   PT Problem List Decreased strength;Decreased balance;Decreased mobility;Decreased knowledge of use of DME;Pain  PT Treatment Interventions DME instruction;Gait training;Stair training;Functional mobility training;Therapeutic activities;Therapeutic exercise;Balance training;Neuromuscular re-education;Patient/family education   PT Goals (Current goals can be found in the Care Plan section) Acute Rehab PT Goals Patient Stated Goal: to go home when im ready PT Goal Formulation: With patient Time For Goal Achievement: 06/23/14 Potential to Achieve Goals: Good    Frequency Min 5X/week   Barriers to discharge        Co-evaluation               End of Session Equipment Utilized During Treatment: Gait belt Activity Tolerance: Patient tolerated treatment well Patient left: in chair;with call bell/phone within reach;with family/visitor present Nurse Communication: Mobility status;Precautions    Functional Assessment Tool Used: clinical judgement  Functional Limitation: Mobility: Walking and moving around Mobility: Walking and Moving Around Current Status [phone]): At least 1 percent but less than 20 percent impaired, limited or restricted Mobility: Walking and Moving Around Goal Status [phone]): At least 1 percent but less than 20 percent impaired, limited or restricted    Time: 0946-1010 PT Time Calculation (min): 24 min   Charges:   PT Evaluation $Initial PT Evaluation Tier I: 1 Procedure PT Treatments $Gait Training: 8-22 mins   PT G Codes:   Functional Assessment Tool Used: clinical judgement  Functional Limitation: Mobility: Walking and moving around    [place], [place], [place]   [phone] 06/20/2014, 11:05 AM

## 2014-06-20 NOTE — Consult Note (Signed)
Orthopaedic Trauma Service Consultation  Reason for Consult: pelvic ring fractures Referring Physician: Donne Hazel,  MD, Trauma Service  Anne Macias is an 80 y.o. female.  HPI: Very pleasant female fell at [facility] with inability to bear weight afterward.  Caught herself with right wrist but denies pain there. No lightheadedness or chest pain before falling. Pain worse with motion, primarily in the sacral area posteriorly, moderately severe, but quite reasonable at rest. On coumadin and admitted by Trauma Service.   Past Medical History  Diagnosis Date  . Edema of lower extremity   . PAF (paroxysmal atrial fibrillation) 1998  . Hypertension   . Hyperlipidemia   . Visual disturbance   . Tachy-brady syndrome   . Chronic anticoagulation   . S/P cardiac pacemaker procedure Sept 0960    complicated by pocket hematoma  . Tremor 05/08/2014    Past Surgical History  Procedure Laterality Date  . Insert / replace / remove pacemaker  08/28/2010    implantaton  . Cholecystectomy  2005  . Cataract extraction    . US echocardiography  04/10/2010    EF 55-60%  . Cardioversion N/A 02/26/2014    Procedure: CARDIOVERSION;  Surgeon: Deboraha Sprang, MD;  Location: [facility];  Service: Cardiovascular;  Laterality: N/A;  . Cardioversion N/A 03/19/2014    Procedure: CARDIOVERSION;  Surgeon: Kandace Spark, MD;  Location: [facility] ENDOSCOPY;  Service: Cardiovascular;  Laterality: N/A;    Family History  Problem Relation Age of Onset  . Heart attack Mother   . Pneumonia Father   . Heart attack Brother   . Alcohol abuse Brother     Social History:  reports that she has never smoked. She does not have any smokeless tobacco history on file. She reports that she does not drink alcohol or use illicit drugs.  Allergies: No Known Allergies  Medications: I have reviewed the patient's current medications.  Results for orders placed during the hospital encounter of 06/19/14 (from the past 48 hour(s))   PROTIME-INR     Status: Abnormal   Collection Time    06/19/14  3:34 PM      Result Value Ref Range   Prothrombin Time 26.1 (*) 11.6 - 15.2 seconds   INR 2.39 (*) 0.00 - 1.49  CBC WITH DIFFERENTIAL     Status: Abnormal   Collection Time    06/19/14  5:37 PM      Result Value Ref Range   WBC 9.1  4.0 - 10.5 K/uL   RBC 4.44  3.87 - 5.11 MIL/uL   Hemoglobin 12.5  12.0 - 15.0 g/dL   HCT 38.2  36.0 - 46.0 %   MCV 86.0  78.0 - 100.0 fL   MCH 28.2  26.0 - 34.0 pg   MCHC 32.7  30.0 - 36.0 g/dL   RDW 17.8 (*) 11.5 - 15.5 %   Platelets 164  150 - 400 K/uL   Neutrophils Relative % 71  43 - 77 %   Neutro Abs 6.4  1.7 - 7.7 K/uL   Lymphocytes Relative 19  12 - 46 %   Lymphs Abs 1.7  0.7 - 4.0 K/uL   Monocytes Relative 10  3 - 12 %   Monocytes Absolute 1.0  0.1 - 1.0 K/uL   Eosinophils Relative 0  0 - 5 %   Eosinophils Absolute 0.0  0.0 - 0.7 K/uL   Basophils Relative 0  0 - 1 %   Basophils Absolute 0.0  0.0 - 0.1  K/uL  BASIC METABOLIC PANEL     Status: Abnormal   Collection Time    06/19/14  5:37 PM      Result Value Ref Range   Sodium 139  137 - 147 mEq/L   Potassium 4.0  3.7 - 5.3 mEq/L   Chloride 100  96 - 112 mEq/L   CO2 28  19 - 32 mEq/L   Glucose, Bld 99  70 - 99 mg/dL   BUN 14  6 - 23 mg/dL   Creatinine, Ser 0.70  0.50 - 1.10 mg/dL   Calcium 8.2 (*) 8.4 - 10.5 mg/dL   GFR calc non Af Amer 75 (*) >90 mL/min   GFR calc Af Amer 86 (*) >90 mL/min   Comment: (NOTE)     The eGFR has been calculated using the CKD EPI equation.     This calculation has not been validated in all clinical situations.     eGFR's persistently <90 mL/min signify possible Chronic Kidney     Disease.   Anion gap 11  5 - 15  CBC     Status: Abnormal   Collection Time    06/20/14  6:10 AM      Result Value Ref Range   WBC 5.3  4.0 - 10.5 K/uL   RBC 3.73 (*) 3.87 - 5.11 MIL/uL   Hemoglobin 10.7 (*) 12.0 - 15.0 g/dL   HCT 32.7 (*) 36.0 - 46.0 %   MCV 87.7  78.0 - 100.0 fL   MCH 28.7  26.0 - 34.0 pg    MCHC 32.7  30.0 - 36.0 g/dL   RDW 18.0 (*) 11.5 - 15.5 %   Platelets 150  150 - 400 K/uL  BASIC METABOLIC PANEL     Status: Abnormal   Collection Time    06/20/14  6:10 AM      Result Value Ref Range   Sodium 138  137 - 147 mEq/L   Potassium 4.2  3.7 - 5.3 mEq/L   Chloride 101  96 - 112 mEq/L   CO2 29  19 - 32 mEq/L   Glucose, Bld 98  70 - 99 mg/dL   BUN 17  6 - 23 mg/dL   Creatinine, Ser 0.83  0.50 - 1.10 mg/dL   Calcium 7.8 (*) 8.4 - 10.5 mg/dL   GFR calc non Af Amer 61 (*) >90 mL/min   GFR calc Af Amer 70 (*) >90 mL/min   Comment: (NOTE)     The eGFR has been calculated using the CKD EPI equation.     This calculation has not been validated in all clinical situations.     eGFR's persistently <90 mL/min signify possible Chronic Kidney     Disease.   Anion gap 8  5 - 15    Dg Hip Complete Left  06/19/2014   CLINICAL DATA:  Fall.  LEFT hip pain fall.  Hip pain.  HIP PAIN  EXAM: LEFT HIP - COMPLETE 2+ VIEW  COMPARISON:  None.  FINDINGS: Acute appearing LEFT obturator ring fractures are present with complementary LEFT sacral ala fracture. The parasymphyseal pubis is inferiorly displaced 7 mm along LEFT iliopubic line. Sclerotic lesion in is present in the LEFT supra-acetabular ilium measuring 14 mm, compatible with a benign bone island. Atherosclerosis is noted. The proximal LEFT femur appears intact hip joint spaces appear preserved and symmetric. Partially visualized lumbar spondylosis. The RIGHT obturator ring and RIGHT sacral ala appear intact.  IMPRESSION: Acute mildly displaced LEFT obturator ring  fractures with complementary of LEFT sacral ala fracture. Intact proximal LEFT femur.   Electronically Signed   By: Dereck Ligas M.D.   On: 06/19/2014 13:35   Ct Pelvis Wo Contrast  06/19/2014   CLINICAL DATA:  Hip pain after a fall.  EXAM: CT PELVIS WITHOUT CONTRAST  TECHNIQUE: Multidetector CT imaging of the pelvis was performed following the standard protocol without intravenous  contrast.  COMPARISON:  Radiograph of earlier today.  FINDINGS: Soft tissues: Moderate amount of colonic stool. Normal pelvic small bowel loops and image portion of the appendix. No intraperitoneal fluid.  Pelvic atherosclerosis. No pelvic adenopathy. Uterine atrophy. Urinary bladder displaced to the right by extraperitoneal hemorrhage/hematoma. Extraperitoneal hematoma surrounding the left parasymphyseal pubic bone fractures. Example image 57/ series 3.  There is also edema superficial of the left greater trochanter of the femur.  Bones:  No proximal femur fracture.  Left acetabular bone island.  Comminuted left parasymphyseal pubic bone fracture. Minimally displaced left inferior pubic ramus fracture.  Minimally displaced left sacral fractures, with extension into the left sacroiliac joint. No definite sacral ala involvement, although osteopenia degrades evaluation in this area.  L4-5 and L5-S1 degenerative disc disease.  IMPRESSION: 1. Comminuted left parasymphyseal pubic bone fracture with minimally displaced inferior left pubic ramus fracture. Extraperitoneal hematoma adjacent the pubic bone fracture. This displaces the urinary bladder. Cannot exclude bladder injury. Correlate with symptoms and consider CT cystogram. 2. A minimally displaced left sacral fracture. 3. No evidence of proximal femur fracture. 4. Osteopenia.   Electronically Signed   By: Abigail Miyamoto M.D.   On: 06/19/2014 17:13   Dg Chest Port 1 View  06/19/2014   CLINICAL DATA:  Fall, no shortness of breath  EXAM: PORTABLE CHEST - 1 VIEW  COMPARISON:  08/29/2010  FINDINGS: Heart size normal. Two lead cardiac pacer stable. Vascular pattern normal. No consolidation effusion or pneumothorax. Minimal atelectasis left lung base. Bony thorax intact.  IMPRESSION: No active disease.   Electronically Signed   By: Skipper Cliche M.D.   On: 06/19/2014 19:00    ROS  As noted above. Blood pressure 123/49, pulse 70, temperature 98.6 F (37 C),  temperature source Oral, resp. rate 16, SpO2 97.00%. Physical Exam RUEx shoulder, elbow, wrist, digits- no skin wounds, nontender, no instability, no blocks to motion  Slight ecchymosis of right wrist but nontender over distal radius  Sens  Ax/R/M/U intact  Mot   Ax/ R/ PIN/ M/ AIN/ U intact  Rad 2+ UEx shoulder, elbow, wrist, digits- no skin wounds, nontender, no instability, no blocks to motion  Sens  Ax/R/M/U intact  Mot   Ax/ R/ PIN/ M/ AIN/ U intact  Rad 2+ RLE No pain with axial loading  No traumatic wounds, ecchymosis, or rash  Nontender  No effusions  Knee stable to varus/ valgus stress  Sens DPN, SPN, TN intact  Motor EHL, ext, flex, evers intact  DP 2+, No significant edema LLE No pain with axial loading  No traumatic wounds, ecchymosis, or rash  Nontender  No effusions  Knee stable to varus/ valgus stress  Sens DPN, SPN, TN intact  Motor EHL, ext, flex, evers 5/5  DP 2+, PT 2+, No significant edema Pelvis--no traumatic wounds or rash, no ecchymosis, stable grossly to stress, mildly tender to compression  Assessment/Plan: Left pelvic fracture, left rami and sacral ala No femoral neck fracture  WBAT with PT, DME assessment, OK to continue coumadin so long as labs remain stable Will follow with Trauma Service  Altamese Newport, MD [facility] [phone] (p)   06/20/2014  8:45 AM

## 2014-06-21 DIAGNOSIS — D62 Acute posthemorrhagic anemia: Secondary | ICD-10-CM | POA: Diagnosis not present

## 2014-06-21 DIAGNOSIS — S32509A Unspecified fracture of unspecified pubis, initial encounter for closed fracture: Secondary | ICD-10-CM | POA: Diagnosis not present

## 2014-06-21 DIAGNOSIS — S329XXA Fracture of unspecified parts of lumbosacral spine and pelvis, initial encounter for closed fracture: Secondary | ICD-10-CM | POA: Diagnosis not present

## 2014-06-21 LAB — CBC
HCT: 29.3 % — ABNORMAL LOW (ref 36.0–46.0)
HEMATOCRIT: 27.2 % — AB (ref 36.0–46.0)
HEMOGLOBIN: 9 g/dL — AB (ref 12.0–15.0)
HEMOGLOBIN: 9.6 g/dL — AB (ref 12.0–15.0)
MCH: 28.5 pg (ref 26.0–34.0)
MCH: 28.4 pg (ref 26.0–34.0)
MCHC: 33.1 g/dL (ref 30.0–36.0)
MCHC: 32.8 g/dL (ref 30.0–36.0)
MCV: 86.1 fL (ref 78.0–100.0)
MCV: 86.7 fL (ref 78.0–100.0)
Platelets: 126 10*3/uL — ABNORMAL LOW (ref 150–400)
Platelets: 155 10*3/uL (ref 150–400)
RBC: 3.16 MIL/uL — ABNORMAL LOW (ref 3.87–5.11)
RBC: 3.38 MIL/uL — AB (ref 3.87–5.11)
RDW: 18.1 % — ABNORMAL HIGH (ref 11.5–15.5)
RDW: 18.1 % — ABNORMAL HIGH (ref 11.5–15.5)
WBC: 4.9 10*3/uL (ref 4.0–10.5)
WBC: 7.1 10*3/uL (ref 4.0–10.5)

## 2014-06-21 MED ORDER — TRAMADOL HCL 50 MG PO TABS: 50.0000 mg | ORAL_TABLET | Freq: Four times a day (QID) | ORAL | Status: DC | PRN

## 2014-06-21 MED ORDER — BISACODYL 5 MG PO TBEC
10.0000 mg | DELAYED_RELEASE_TABLET | Freq: Once | ORAL | Status: DC
  Filled 2014-06-21: qty 2

## 2014-06-21 NOTE — Clinical Social Work Note (Signed)
Clinical Social Work Department BRIEF PSYCHOSOCIAL ASSESSMENT 06/21/2014  Patient:  Anne Macias, Anne Macias     Account Number:  [id number]     Admit date:  06/19/2014  Clinical Social Worker:  Myles Lipps  Date/Time:  06/21/2014 03:15 PM  Referred by:  Physician  Date Referred:  06/21/2014 Referred for  Psychosocial assessment   Other Referral:   Interview type:  Patient Other interview type:   Patient husband at bedside    PSYCHOSOCIAL DATA Living Status:  HUSBAND Admitted from facility:   Level of care:   Primary support name:  Tyneka, Scafidi  [phone] Primary support relationship to patient:  SPOUSE Degree of support available:   Strong    CURRENT CONCERNS Current Concerns  None Noted   Other Concerns:    SOCIAL WORK ASSESSMENT / PLAN Clinical Social Worker met with patient and patient husband at bedside to offer support and discuss patient needs at discharge.  Patient states that her and her husband were shopping in [place] when she tripped over something and fell.  Patient remembers the whole incident and feels that it was just a clumsy fall.  Patient is not having any signs of Acute Stress Response from the fall and is working well enough with therapies to go home with home health and her husband.    Clinical Social Worker inquired about current substance use.  Patient states that she has never used drugs and drinks 5 oz of red wine every evening.  Patient understands that alcohol and medication should not be combined at home. Patient plans to continue her current evening glass of wine and does not desire resources at this time to cease current use.  SBIRT complete.  CSW signing off at this time. Please reconsult if further needs arise prior to discharge.   Assessment/plan status:  No Further Intervention Required Other assessment/ plan:   Information/referral to community resources:   Holiday representative offered patient resources for Meals on Wheels and  other home assistance, however patient states that her husband still drives and is fully capable of assisting her as needed.  Patient has a son that is a Field seismologist and a daughter who is in the process of moving to [place].    PATIENT'S/FAMILY'S RESPONSE TO PLAN OF CARE: Patient alert and oriented x3 sitting up in the chair. Patient husband assisting patient with putting her clothes on for discharge.  Patient feels that she has plenty of support once discharged home.  Patient was fully engaged in conversation and assessment process.  Patient verbalized her wonderful hospital experience and expressed gratitude for CSW support and concern.

## 2014-06-21 NOTE — Discharge Summary (Signed)
Makeyla Govan, MD, MPH, FACS Trauma: [phone] General Surgery: [phone]  

## 2014-06-21 NOTE — Progress Notes (Signed)
Patient discharged home with husband. Discharge instructions given along with prescriptions. Equipment taken home with patient. Questions asked and answered.

## 2014-06-21 NOTE — Discharge Instructions (Signed)
Always have someone with you when you stand or walk.

## 2014-06-21 NOTE — Evaluation (Signed)
Occupational Therapy Evaluation and Discharge Patient Details Name: Anne Macias MRN: [id number] DOB: 04-Feb-1923 Today's Date: 78/23/2015    History of Present Illness 80 y.o. female fell at [facility] with inability to bear weight afterward. Pt Has nonoperative pelvic fractures.   Clinical Impression   This 80 yo presents to acute OT with all education completed and no further questions from pt or husband. Acute OT will sign off.    Follow Up Recommendations  No OT follow up    Equipment Recommendations  3 in 1 bedside comode (already delivered)       Precautions / Restrictions Precautions Precautions: Fall Restrictions Weight Bearing Restrictions: No LLE Weight Bearing: Weight bearing as tolerated              ADL Overall ADL's : Needs assistance/impaired Eating/Feeding: Independent   Grooming: Set up   Upper Body Bathing: Set up   Lower Body Bathing: Moderate assistance (with min gurad A sit<>stand)   Upper Body Dressing : Set up   Lower Body Dressing: Maximal assistance (with min guard A sit<>stand)   Toilet Transfer: Min guard;Ambulation;RW;Comfort height toilet;Grab bars   Toileting- Clothing Manipulation and Hygiene: Min guard;Sit to/from Nurse, children's Details (indicate cue type and reason): I explained to pt and her husband that she will step into the shower stall with the same leg that she steps up steps with when practicing steps with PT. Pt states that she does not feel it would be very comfortable for her to sit down on her tiled built in tub seat--I recommended that she put a couple of towels on it for padding.    I went over dressing sequence with pt and husband for ease and energy conservation.               Pertinent Vitals/Pain No c/o     Hand Dominance Right   Extremity/Trunk Assessment Upper Extremity Assessment Upper Extremity Assessment: Overall WFL for tasks assessed           Communication  Communication Communication: No difficulties   Cognition Arousal/Alertness: Awake/alert Behavior During Therapy: WFL for tasks assessed/performed Overall Cognitive Status: Within Functional Limits for tasks assessed                                Home Living Family/patient expects to be discharged to:: Private residence Living Arrangements: Spouse/significant other Available Help at Discharge: Family;Available 24 hours/day Type of Home: House Home Access: Stairs to enter [employer] of Steps: 4 Entrance Stairs-Rails: Right;Left Home Layout: One level     Bathroom Shower/Tub: Walk-in shower;Door   [employer] Toilet: Standard     Home Equipment: Civil engineer, contracting - built in          Prior Functioning/Environment Level of Independence: Independent                      OT Goals(Current goals can be found in the care plan section) Acute Rehab OT Goals Patient Stated Goal: home today hopefully  OT Frequency:                End of Session    Activity Tolerance: Patient tolerated treatment well Patient left: in bed (with PT coming into work with him)   Time: 1351-1400 OT Time Calculation (min): 9 min Charges:  OT General Charges $OT Visit: 1 Procedure OT Evaluation $Initial OT Evaluation Tier I: 1 Procedure  G-Codes: OT G-codes **NOT FOR INPATIENT CLASS** Functional Assessment Tool Used: Clinical observation Functional Limitation: Self care Self Care Current Status (Z6109): At least 60 percent but less than 80 percent impaired, limited or restricted Self Care Goal Status (U0454): At least 60 percent but less than 80 percent impaired, limited or restricted Self Care Discharge Status [id number]): At least 60 percent but less than 80 percent impaired, limited or restricted  Anne Macias [phone] 06/21/2014, 2:33 PM

## 2014-06-21 NOTE — Progress Notes (Signed)
Patient ID: Anne Macias, female   DOB: Aug 15, 1923, 80 y.o.   MRN: [id number]   LOS: 2 days   Subjective: No new c/o. No BM.   Objective: Vital signs in last 24 hours: Temp:  [97.6 F (36.4 C)-98 F (36.7 C)] 97.9 F (36.6 C) (07/23 0725) Pulse Rate:  [70-75] 75 (07/23 0725) Resp:  [16] 16 (07/23 0725) BP: (113-141)/(55-59) 141/59 mmHg (07/23 0725) SpO2:  [95 %-96 %] 96 % (07/23 0725) Last BM Date: 06/18/14   Laboratory  CBC  Recent Labs  06/20/14 0610 06/21/14 0508  WBC 5.3 4.9  HGB 10.7* 9.0*  HCT 32.7* 27.2*  PLT 150 126*    Physical Exam General appearance: alert and no distress Resp: clear to auscultation bilaterally Cardio: regular rate and rhythm GI: normal findings: bowel sounds normal and soft, non-tender   Assessment/Plan: Fall  Multiple pelvic fxs -- WBAT  ABL anemia -- Moderate, will check this afternoon. If down again may consider giving some vitamin K and/or FFP Multiple medical problems -- Home meds  FEN -- Non-narcotic analgesia given age, constipation issues. Give Dulcolax. VTE -- SCD's  Dispo -- PT/OT    Lisette Abu, PA-C Pager: [phone] General Trauma PA Pager: [phone]  06/21/2014

## 2014-06-21 NOTE — Discharge Summary (Signed)
Physician Discharge Summary  Patient ID: Anne Macias MRN: [id number] DOB/AGE: May 24, 1923 80 y.o.  Admit date: 06/19/2014 Discharge date: 06/21/2014  Discharge Diagnoses Patient Active Problem List   Diagnosis Date Noted  . Fall 06/20/2014  . Anticoagulated on Coumadin 06/20/2014  . Hyperlipidemia 06/20/2014  . Acute blood loss anemia 06/20/2014  . Pelvic fracture 06/19/2014  . Tremor 05/08/2014  . Renal insufficiency 03/12/2014  . Anorexia 03/12/2014  . Sinus node dysfunction 02/17/2012  . ESSENTIAL HYPERTENSION, BENIGN 12/09/2010  . ATRIAL FIBRILLATION 12/09/2010  . PACEMAKER, PERMANENT-Medtronic REVO 12/08/2010    Consultants Dr. Altamese Fairfield for orthopedic surgery   Procedures None   HPI: [name] was with her husband Rush Landmark at [place] when she tripped and fell. There was no loss of consciousness nor amnesia. She broke her fall with her arm trying to stop. She was unable to stand afterwards and required a wheelchair. She had pain in her suprapubic region and pelvis. She had undergone CT scanning of her pelvis that showed rami and sacral ala fractures. Orthopedic surgery was consulted and she was admitted to the trauma service.    Hospital Course: The patient's coumadin was held while she was in the hospital but her anticoagulation was not reversed. Her hemoglobin drifted down to ~9 but rebounded and she did not require transfusion of any blood products. She was mobilized with physical and occupational therapies and did well enough that they recommended home with home health therapies. She was discharged in good condition in the care of her husband. She was instructed to restart her coumadin upon discharge.      Medication List         CALTRATE 600+D PO  Take 2 tablets by mouth daily.     docusate sodium 100 MG capsule  Commonly known as:  COLACE  Take 100 mg by mouth 2 (two) times daily.     metoprolol succinate 100 MG 24 hr tablet  Commonly known as:  TOPROL-XL   Take 1 tablet (100 mg total) by mouth 2 (two) times daily. Take with or immediately following a meal.     propafenone 225 MG tablet  Commonly known as:  RYTHMOL  Take 1 tablet (225 mg total) by mouth 2 (two) times daily.     simvastatin 40 MG tablet  Commonly known as:  ZOCOR  Take 40 mg by mouth every evening.     traMADol 50 MG tablet  Commonly known as:  ULTRAM  Take 1-2 tablets (50-100 mg total) by mouth every 6 (six) hours as needed (Pain).     warfarin 5 MG tablet  Commonly known as:  COUMADIN  Take 5 mg by mouth as directed.             Follow-up Information   Follow up with HANDY,Othel Hoogendoorn H, MD. Schedule an appointment as soon as possible for a visit in 2 weeks.   Specialty:  Orthopedic Surgery   Contact information:   [address] [place] [address] [phone]       Schedule an appointment as soon as possible for a visit with Irven Shelling, MD.   Specialty:  Internal Medicine   Contact information:   [address] [place] [address] [phone]       Call [facility]. (As needed)    Contact information:   [address] [place] [address] [phone]       Signed: Lisette Abu, PA-C  Pager: [phone] General Trauma PA Pager: [phone] 06/21/2014, 3:06 PM

## 2014-06-21 NOTE — Progress Notes (Signed)
Check Hb this PM. If sig lower, will have to reverse anticoag. She just had a BM and feels better from that standpoint. Patient examined and I agree with the assessment and plan  Georganna Skeans, MD, MPH, FACS Trauma: [phone] General Surgery: [phone]  06/21/2014 10:09 AM

## 2014-06-21 NOTE — Progress Notes (Signed)
Physical Therapy Treatment Patient Details Name: Anne Macias MRN: [id number] DOB: [birth year] Today's Date: 06/21/2014    History of Present Illness 80 y.o. female fell at [facility] with inability to bear weight afterward. Pt Has nonoperative pelvic fractures.    PT Comments    Pt was seen with husband to review her need for assistance with gait and steps. Used her new walker to verify height was correct and pt reports decreased discomfort with steps and gait versus extended sitting still.  Pain 7-8.  Follow Up Recommendations        Equipment Recommendations       Recommendations for Other Services       Precautions / Restrictions Precautions Precautions: Fall Restrictions Weight Bearing Restrictions: No LLE Weight Bearing: Weight bearing as tolerated    Mobility  Bed Mobility Overal bed mobility: Needs Assistance Bed Mobility: Rolling;Supine to Sit Rolling: Min assist   Supine to sit: Min assist     General bed mobility comments: Cues needed to assist with LLE, pt was able to follow instructions but needs tactile cue to initiate and is fearful of reinjuring her LLE  Transfers Overall transfer level: Needs assistance Equipment used: Rolling walker (2 wheeled) Transfers: Sit to/from Stand Sit to Stand: Min assist         General transfer comment: Min assist to correct hand use and min cues for sequencing  Ambulation/Gait Ambulation/Gait assistance: Min guard Ambulation Distance (Feet): 80 Feet Assistive device: Rolling walker (2 wheeled) Gait Pattern/deviations: Step-through pattern;Decreased stance time - left Gait velocity: slower Gait velocity interpretation: Below normal speed for age/gender General Gait Details: Cues to sequence walker, for posture and for gait pattern   Stairs Stairs: Yes Stairs assistance: Min guard Stair Management: Two rails;Alternating pattern Number of Stairs: 10 General stair comments: Husband present to observe and offer  CGA as pt covers the steps  Wheelchair Mobility    Modified Rankin (Stroke Patients Only)       Balance Overall balance assessment: Needs assistance Sitting-balance support: Feet supported   Sitting balance - Comments: Sat bedside and in chair     Standing balance-Leahy Scale: Fair Standing balance comment: uses one UE to support             High level balance activites: Direction changes High Level Balance Comments: minimal guarding and vc's    Cognition Arousal/Alertness: Awake/alert Behavior During Therapy: WFL for tasks assessed/performed Overall Cognitive Status: Within Functional Limits for tasks assessed                      Exercises      General Comments        Pertinent Vitals/Pain Pain with gait  7-8, BP was 109/45, pulse 71, O2 sats 97%.    Home Living Family/patient expects to be discharged to:: Private residence Living Arrangements: Spouse/significant other Available Help at Discharge: Family;Available 24 hours/day Type of Home: House Home Access: Stairs to enter Entrance Stairs-Rails: Right;Left Home Layout: One level Home Equipment: Shower seat - built in      Prior Function Level of Independence: Independent          PT Goals (current goals can now be found in the care plan section) Acute Rehab PT Goals Patient Stated Goal: Home is planned today Progress towards PT goals: Progressing toward goals    Frequency       PT Plan      Co-evaluation  End of Session   Activity Tolerance: Patient limited by pain Patient left: in chair;with call bell/phone within reach;with family/visitor present     Time: 5945-8592 PT Time Calculation (min): 38 min  Charges:  $Gait Training: 8-22 mins $Therapeutic Activity: 8-22 mins $Self Care/Home Management: 2023/07/23                    G Codes:      Ramond Dial 06-22-14, 3:20 PM Mee Hives, PT MS Acute Rehab Dept. Number: [phone]

## 2014-06-21 NOTE — Progress Notes (Signed)
Orthopaedic Trauma Service (OTS)  Subjective: Superstar performance with PT; hopeful to go home today pending lab results; DME delivered  Objective: Current Vitals Blood pressure 141/59, pulse 75, temperature 97.9 F (36.6 C), temperature source Oral, resp. rate 16, SpO2 96.00%. Vital signs in last 24 hours: Temp:  [97.6 F (36.4 C)-98 F (36.7 C)] 97.9 F (36.6 C) (07/23 0725) Pulse Rate:  [70-75] 75 (07/23 0725) Resp:  [16] 16 (07/23 0725) BP: (113-141)/(55-59) 141/59 mmHg (07/23 0725) SpO2:  [95 %-96 %] 96 % (07/23 0725)  Intake/Output from previous day: 07/22 0701 - 07/23 0700 In: 720 [P.O.:720] Out: -   LABS  Recent Labs  06/19/14 1737 06/20/14 0610 06/21/14 0508  HGB 12.5 10.7* 9.0*    Recent Labs  06/20/14 0610 06/21/14 0508  WBC 5.3 4.9  RBC 3.73* 3.16*  HCT 32.7* 27.2*  PLT 150 126*    Recent Labs  06/19/14 1737 06/20/14 0610  NA 139 138  K 4.0 4.2  CL 100 101  CO2 28 29  BUN 14 17  CREATININE 0.70 0.83  GLUCOSE 99 98  CALCIUM 8.2* 7.8*    Recent Labs  06/19/14 1534  INR 2.39*    Physical Exam  PE  Unchanged  No LE edema bilat   Assessment/Plan: Acute blood loss anemia, stable INR Trauma service to check blood count again this pm or possibly keep until tomorrow given Hgb of 9. Cont PT, OT F/u in office 10-14 days post op if d/c'd  Altamese Pastos, MD [facility] [phone] (p)   06/21/2014, 8:03 AM

## 2014-06-22 DIAGNOSIS — IMO0001 Reserved for inherently not codable concepts without codable children: Secondary | ICD-10-CM | POA: Diagnosis not present

## 2014-06-22 DIAGNOSIS — I4891 Unspecified atrial fibrillation: Secondary | ICD-10-CM | POA: Diagnosis not present

## 2014-06-22 DIAGNOSIS — I495 Sick sinus syndrome: Secondary | ICD-10-CM | POA: Diagnosis not present

## 2014-06-22 DIAGNOSIS — IMO0002 Reserved for concepts with insufficient information to code with codable children: Secondary | ICD-10-CM | POA: Diagnosis not present

## 2014-06-22 DIAGNOSIS — I10 Essential (primary) hypertension: Secondary | ICD-10-CM | POA: Diagnosis not present

## 2014-06-25 DIAGNOSIS — IMO0001 Reserved for inherently not codable concepts without codable children: Secondary | ICD-10-CM | POA: Diagnosis not present

## 2014-06-25 DIAGNOSIS — I10 Essential (primary) hypertension: Secondary | ICD-10-CM | POA: Diagnosis not present

## 2014-06-25 DIAGNOSIS — I4891 Unspecified atrial fibrillation: Secondary | ICD-10-CM | POA: Diagnosis not present

## 2014-06-25 DIAGNOSIS — IMO0002 Reserved for concepts with insufficient information to code with codable children: Secondary | ICD-10-CM | POA: Diagnosis not present

## 2014-06-25 DIAGNOSIS — I495 Sick sinus syndrome: Secondary | ICD-10-CM | POA: Diagnosis not present

## 2014-06-27 DIAGNOSIS — I495 Sick sinus syndrome: Secondary | ICD-10-CM | POA: Diagnosis not present

## 2014-06-27 DIAGNOSIS — IMO0002 Reserved for concepts with insufficient information to code with codable children: Secondary | ICD-10-CM | POA: Diagnosis not present

## 2014-06-27 DIAGNOSIS — I10 Essential (primary) hypertension: Secondary | ICD-10-CM | POA: Diagnosis not present

## 2014-06-27 DIAGNOSIS — I4891 Unspecified atrial fibrillation: Secondary | ICD-10-CM | POA: Diagnosis not present

## 2014-06-27 DIAGNOSIS — IMO0001 Reserved for inherently not codable concepts without codable children: Secondary | ICD-10-CM | POA: Diagnosis not present

## 2014-06-29 DIAGNOSIS — I495 Sick sinus syndrome: Secondary | ICD-10-CM | POA: Diagnosis not present

## 2014-06-29 DIAGNOSIS — I4891 Unspecified atrial fibrillation: Secondary | ICD-10-CM | POA: Diagnosis not present

## 2014-06-29 DIAGNOSIS — I10 Essential (primary) hypertension: Secondary | ICD-10-CM | POA: Diagnosis not present

## 2014-06-29 DIAGNOSIS — IMO0002 Reserved for concepts with insufficient information to code with codable children: Secondary | ICD-10-CM | POA: Diagnosis not present

## 2014-06-29 DIAGNOSIS — IMO0001 Reserved for inherently not codable concepts without codable children: Secondary | ICD-10-CM | POA: Diagnosis not present

## 2014-07-02 DIAGNOSIS — IMO0002 Reserved for concepts with insufficient information to code with codable children: Secondary | ICD-10-CM | POA: Diagnosis not present

## 2014-07-02 DIAGNOSIS — IMO0001 Reserved for inherently not codable concepts without codable children: Secondary | ICD-10-CM | POA: Diagnosis not present

## 2014-07-02 DIAGNOSIS — I495 Sick sinus syndrome: Secondary | ICD-10-CM | POA: Diagnosis not present

## 2014-07-02 DIAGNOSIS — I4891 Unspecified atrial fibrillation: Secondary | ICD-10-CM | POA: Diagnosis not present

## 2014-07-02 DIAGNOSIS — I10 Essential (primary) hypertension: Secondary | ICD-10-CM | POA: Diagnosis not present

## 2014-07-03 DIAGNOSIS — I4891 Unspecified atrial fibrillation: Secondary | ICD-10-CM | POA: Diagnosis not present

## 2014-07-03 DIAGNOSIS — I495 Sick sinus syndrome: Secondary | ICD-10-CM | POA: Diagnosis not present

## 2014-07-03 DIAGNOSIS — IMO0002 Reserved for concepts with insufficient information to code with codable children: Secondary | ICD-10-CM | POA: Diagnosis not present

## 2014-07-03 DIAGNOSIS — I10 Essential (primary) hypertension: Secondary | ICD-10-CM | POA: Diagnosis not present

## 2014-07-03 DIAGNOSIS — IMO0001 Reserved for inherently not codable concepts without codable children: Secondary | ICD-10-CM | POA: Diagnosis not present

## 2014-07-04 DIAGNOSIS — S3282XA Multiple fractures of pelvis without disruption of pelvic ring, initial encounter for closed fracture: Secondary | ICD-10-CM | POA: Diagnosis not present

## 2014-07-06 DIAGNOSIS — IMO0001 Reserved for inherently not codable concepts without codable children: Secondary | ICD-10-CM | POA: Diagnosis not present

## 2014-07-06 DIAGNOSIS — I10 Essential (primary) hypertension: Secondary | ICD-10-CM | POA: Diagnosis not present

## 2014-07-06 DIAGNOSIS — IMO0002 Reserved for concepts with insufficient information to code with codable children: Secondary | ICD-10-CM | POA: Diagnosis not present

## 2014-07-06 DIAGNOSIS — I4891 Unspecified atrial fibrillation: Secondary | ICD-10-CM | POA: Diagnosis not present

## 2014-07-06 DIAGNOSIS — I495 Sick sinus syndrome: Secondary | ICD-10-CM | POA: Diagnosis not present

## 2014-07-09 DIAGNOSIS — IMO0002 Reserved for concepts with insufficient information to code with codable children: Secondary | ICD-10-CM | POA: Diagnosis not present

## 2014-07-09 DIAGNOSIS — I10 Essential (primary) hypertension: Secondary | ICD-10-CM | POA: Diagnosis not present

## 2014-07-09 DIAGNOSIS — I495 Sick sinus syndrome: Secondary | ICD-10-CM | POA: Diagnosis not present

## 2014-07-09 DIAGNOSIS — IMO0001 Reserved for inherently not codable concepts without codable children: Secondary | ICD-10-CM | POA: Diagnosis not present

## 2014-07-09 DIAGNOSIS — I4891 Unspecified atrial fibrillation: Secondary | ICD-10-CM | POA: Diagnosis not present

## 2014-07-11 DIAGNOSIS — IMO0001 Reserved for inherently not codable concepts without codable children: Secondary | ICD-10-CM | POA: Diagnosis not present

## 2014-07-11 DIAGNOSIS — I10 Essential (primary) hypertension: Secondary | ICD-10-CM | POA: Diagnosis not present

## 2014-07-11 DIAGNOSIS — I4891 Unspecified atrial fibrillation: Secondary | ICD-10-CM | POA: Diagnosis not present

## 2014-07-11 DIAGNOSIS — IMO0002 Reserved for concepts with insufficient information to code with codable children: Secondary | ICD-10-CM | POA: Diagnosis not present

## 2014-07-11 DIAGNOSIS — I495 Sick sinus syndrome: Secondary | ICD-10-CM | POA: Diagnosis not present

## 2014-07-13 DIAGNOSIS — IMO0001 Reserved for inherently not codable concepts without codable children: Secondary | ICD-10-CM | POA: Diagnosis not present

## 2014-07-13 DIAGNOSIS — IMO0002 Reserved for concepts with insufficient information to code with codable children: Secondary | ICD-10-CM | POA: Diagnosis not present

## 2014-07-13 DIAGNOSIS — I4891 Unspecified atrial fibrillation: Secondary | ICD-10-CM | POA: Diagnosis not present

## 2014-07-13 DIAGNOSIS — I495 Sick sinus syndrome: Secondary | ICD-10-CM | POA: Diagnosis not present

## 2014-07-13 DIAGNOSIS — I10 Essential (primary) hypertension: Secondary | ICD-10-CM | POA: Diagnosis not present

## 2014-07-16 DIAGNOSIS — I10 Essential (primary) hypertension: Secondary | ICD-10-CM | POA: Diagnosis not present

## 2014-07-16 DIAGNOSIS — I495 Sick sinus syndrome: Secondary | ICD-10-CM | POA: Diagnosis not present

## 2014-07-16 DIAGNOSIS — IMO0001 Reserved for inherently not codable concepts without codable children: Secondary | ICD-10-CM | POA: Diagnosis not present

## 2014-07-16 DIAGNOSIS — IMO0002 Reserved for concepts with insufficient information to code with codable children: Secondary | ICD-10-CM | POA: Diagnosis not present

## 2014-07-16 DIAGNOSIS — I4891 Unspecified atrial fibrillation: Secondary | ICD-10-CM | POA: Diagnosis not present

## 2014-07-17 DIAGNOSIS — S329XXA Fracture of unspecified parts of lumbosacral spine and pelvis, initial encounter for closed fracture: Secondary | ICD-10-CM | POA: Diagnosis not present

## 2014-07-17 DIAGNOSIS — I10 Essential (primary) hypertension: Secondary | ICD-10-CM | POA: Diagnosis not present

## 2014-07-17 DIAGNOSIS — I4891 Unspecified atrial fibrillation: Secondary | ICD-10-CM | POA: Diagnosis not present

## 2014-07-17 DIAGNOSIS — Z7901 Long term (current) use of anticoagulants: Secondary | ICD-10-CM | POA: Diagnosis not present

## 2014-07-17 DIAGNOSIS — M810 Age-related osteoporosis without current pathological fracture: Secondary | ICD-10-CM | POA: Diagnosis not present

## 2014-07-19 DIAGNOSIS — I4891 Unspecified atrial fibrillation: Secondary | ICD-10-CM | POA: Diagnosis not present

## 2014-07-19 DIAGNOSIS — IMO0002 Reserved for concepts with insufficient information to code with codable children: Secondary | ICD-10-CM | POA: Diagnosis not present

## 2014-07-19 DIAGNOSIS — I495 Sick sinus syndrome: Secondary | ICD-10-CM | POA: Diagnosis not present

## 2014-07-19 DIAGNOSIS — I10 Essential (primary) hypertension: Secondary | ICD-10-CM | POA: Diagnosis not present

## 2014-07-19 DIAGNOSIS — IMO0001 Reserved for inherently not codable concepts without codable children: Secondary | ICD-10-CM | POA: Diagnosis not present

## 2014-07-20 DIAGNOSIS — Z7901 Long term (current) use of anticoagulants: Secondary | ICD-10-CM | POA: Diagnosis not present

## 2014-07-20 DIAGNOSIS — I4891 Unspecified atrial fibrillation: Secondary | ICD-10-CM | POA: Diagnosis not present

## 2014-07-22 DIAGNOSIS — IMO0001 Reserved for inherently not codable concepts without codable children: Secondary | ICD-10-CM | POA: Diagnosis not present

## 2014-07-22 DIAGNOSIS — I495 Sick sinus syndrome: Secondary | ICD-10-CM | POA: Diagnosis not present

## 2014-07-22 DIAGNOSIS — I10 Essential (primary) hypertension: Secondary | ICD-10-CM | POA: Diagnosis not present

## 2014-07-22 DIAGNOSIS — IMO0002 Reserved for concepts with insufficient information to code with codable children: Secondary | ICD-10-CM | POA: Diagnosis not present

## 2014-07-22 DIAGNOSIS — I4891 Unspecified atrial fibrillation: Secondary | ICD-10-CM | POA: Diagnosis not present

## 2014-07-24 DIAGNOSIS — I10 Essential (primary) hypertension: Secondary | ICD-10-CM | POA: Diagnosis not present

## 2014-07-24 DIAGNOSIS — I4891 Unspecified atrial fibrillation: Secondary | ICD-10-CM | POA: Diagnosis not present

## 2014-07-24 DIAGNOSIS — IMO0002 Reserved for concepts with insufficient information to code with codable children: Secondary | ICD-10-CM | POA: Diagnosis not present

## 2014-07-24 DIAGNOSIS — I495 Sick sinus syndrome: Secondary | ICD-10-CM | POA: Diagnosis not present

## 2014-07-24 DIAGNOSIS — IMO0001 Reserved for inherently not codable concepts without codable children: Secondary | ICD-10-CM | POA: Diagnosis not present

## 2014-07-30 DIAGNOSIS — IMO0001 Reserved for inherently not codable concepts without codable children: Secondary | ICD-10-CM | POA: Diagnosis not present

## 2014-07-30 DIAGNOSIS — I10 Essential (primary) hypertension: Secondary | ICD-10-CM | POA: Diagnosis not present

## 2014-07-30 DIAGNOSIS — IMO0002 Reserved for concepts with insufficient information to code with codable children: Secondary | ICD-10-CM | POA: Diagnosis not present

## 2014-07-30 DIAGNOSIS — I4891 Unspecified atrial fibrillation: Secondary | ICD-10-CM | POA: Diagnosis not present

## 2014-07-30 DIAGNOSIS — I495 Sick sinus syndrome: Secondary | ICD-10-CM | POA: Diagnosis not present

## 2014-08-01 DIAGNOSIS — S3282XA Multiple fractures of pelvis without disruption of pelvic ring, initial encounter for closed fracture: Secondary | ICD-10-CM | POA: Diagnosis not present

## 2014-08-02 DIAGNOSIS — M810 Age-related osteoporosis without current pathological fracture: Secondary | ICD-10-CM | POA: Diagnosis not present

## 2014-08-03 DIAGNOSIS — I495 Sick sinus syndrome: Secondary | ICD-10-CM | POA: Diagnosis not present

## 2014-08-03 DIAGNOSIS — Z7901 Long term (current) use of anticoagulants: Secondary | ICD-10-CM | POA: Diagnosis not present

## 2014-08-03 DIAGNOSIS — IMO0001 Reserved for inherently not codable concepts without codable children: Secondary | ICD-10-CM | POA: Diagnosis not present

## 2014-08-03 DIAGNOSIS — I10 Essential (primary) hypertension: Secondary | ICD-10-CM | POA: Diagnosis not present

## 2014-08-03 DIAGNOSIS — I4891 Unspecified atrial fibrillation: Secondary | ICD-10-CM | POA: Diagnosis not present

## 2014-08-03 DIAGNOSIS — IMO0002 Reserved for concepts with insufficient information to code with codable children: Secondary | ICD-10-CM | POA: Diagnosis not present

## 2014-08-09 ENCOUNTER — Encounter: Payer: Medicare Other | Admitting: *Deleted

## 2014-08-09 ENCOUNTER — Telehealth: Payer: Self-pay | Admitting: Cardiology

## 2014-08-09 NOTE — Telephone Encounter (Signed)
LMOVM reminding pt to send remote transmission.   

## 2014-08-09 NOTE — Telephone Encounter (Signed)
Pt called and stated that she will send remote transmission when her phone gets fixed.

## 2014-08-10 ENCOUNTER — Encounter: Payer: Self-pay | Admitting: Cardiology

## 2014-08-10 DIAGNOSIS — I4891 Unspecified atrial fibrillation: Secondary | ICD-10-CM | POA: Diagnosis not present

## 2014-08-10 DIAGNOSIS — Z7901 Long term (current) use of anticoagulants: Secondary | ICD-10-CM | POA: Diagnosis not present

## 2014-08-24 DIAGNOSIS — Z7901 Long term (current) use of anticoagulants: Secondary | ICD-10-CM | POA: Diagnosis not present

## 2014-08-24 DIAGNOSIS — I4891 Unspecified atrial fibrillation: Secondary | ICD-10-CM | POA: Diagnosis not present

## 2014-08-29 DIAGNOSIS — S3282XA Multiple fractures of pelvis without disruption of pelvic ring, initial encounter for closed fracture: Secondary | ICD-10-CM | POA: Diagnosis not present

## 2014-09-07 ENCOUNTER — Encounter: Payer: Self-pay | Admitting: *Deleted

## 2014-09-21 DIAGNOSIS — I4891 Unspecified atrial fibrillation: Secondary | ICD-10-CM | POA: Diagnosis not present

## 2014-09-21 DIAGNOSIS — Z7901 Long term (current) use of anticoagulants: Secondary | ICD-10-CM | POA: Diagnosis not present

## 2014-09-28 ENCOUNTER — Telehealth: Payer: Self-pay | Admitting: Internal Medicine

## 2014-09-28 NOTE — Telephone Encounter (Signed)
New message     Pt says her heel is turning purple---not her toes, but one spot on her heel.  Her heel does not hurt and it is not bruised.  She is on blood thinner.  Please advise

## 2014-09-28 NOTE — Telephone Encounter (Signed)
Spoke with pt. She reports she has purple area on outside of right heel. This has been present for about 3 weeks. It is not as noticeable in the AM but becomes more pronounced as the day goes on. Area is about 2 inches by 2 inches. Does not report any pain. States it does not appear to be a bruise. Foot and toes warm to touch. Not discolored.  Pt's primary care provider is Dr. Laurann Montana and coumadin is also followed in their office.  I asked pt to call Dr. Delene Ruffini office today to discuss this and see if office visit indicated. Pt agreeable to this and will contact his office.

## 2014-10-04 DIAGNOSIS — I4891 Unspecified atrial fibrillation: Secondary | ICD-10-CM | POA: Diagnosis not present

## 2014-10-04 DIAGNOSIS — Z7901 Long term (current) use of anticoagulants: Secondary | ICD-10-CM | POA: Diagnosis not present

## 2014-10-09 ENCOUNTER — Encounter: Payer: Self-pay | Admitting: Cardiology

## 2014-11-01 ENCOUNTER — Telehealth: Payer: Self-pay | Admitting: Internal Medicine

## 2014-11-01 DIAGNOSIS — D225 Melanocytic nevi of trunk: Secondary | ICD-10-CM | POA: Diagnosis not present

## 2014-11-01 DIAGNOSIS — L821 Other seborrheic keratosis: Secondary | ICD-10-CM | POA: Diagnosis not present

## 2014-11-01 DIAGNOSIS — B353 Tinea pedis: Secondary | ICD-10-CM | POA: Diagnosis not present

## 2014-11-01 DIAGNOSIS — L814 Other melanin hyperpigmentation: Secondary | ICD-10-CM | POA: Diagnosis not present

## 2014-11-01 NOTE — Telephone Encounter (Signed)
New Message  Pt called had a cardioversion in April and she is on RYTHMOL She is experiencing hair loss and she wants to know if this is normal. Please call back to discuss//sr

## 2014-11-01 NOTE — Telephone Encounter (Signed)
Informed patient that scheduler will call her to arrange office visit with Dr. Caryl Comes to review her options. She is agreeable.

## 2014-11-07 ENCOUNTER — Ambulatory Visit (INDEPENDENT_AMBULATORY_CARE_PROVIDER_SITE_OTHER): Payer: Medicare Other | Admitting: Internal Medicine

## 2014-11-07 ENCOUNTER — Other Ambulatory Visit: Payer: Self-pay | Admitting: Internal Medicine

## 2014-11-07 ENCOUNTER — Encounter: Payer: Self-pay | Admitting: Internal Medicine

## 2014-11-07 VITALS — Ht 67.0 in | Wt 137.0 lb

## 2014-11-07 DIAGNOSIS — I495 Sick sinus syndrome: Secondary | ICD-10-CM | POA: Diagnosis not present

## 2014-11-07 DIAGNOSIS — T887XXA Unspecified adverse effect of drug or medicament, initial encounter: Secondary | ICD-10-CM | POA: Diagnosis not present

## 2014-11-07 DIAGNOSIS — I4891 Unspecified atrial fibrillation: Secondary | ICD-10-CM | POA: Diagnosis not present

## 2014-11-07 DIAGNOSIS — R7301 Impaired fasting glucose: Secondary | ICD-10-CM | POA: Diagnosis not present

## 2014-11-07 DIAGNOSIS — I10 Essential (primary) hypertension: Secondary | ICD-10-CM

## 2014-11-07 DIAGNOSIS — T50905A Adverse effect of unspecified drugs, medicaments and biological substances, initial encounter: Principal | ICD-10-CM

## 2014-11-07 DIAGNOSIS — L658 Other specified nonscarring hair loss: Secondary | ICD-10-CM | POA: Diagnosis not present

## 2014-11-07 DIAGNOSIS — Z7901 Long term (current) use of anticoagulants: Secondary | ICD-10-CM | POA: Diagnosis not present

## 2014-11-07 LAB — MDC_IDC_ENUM_SESS_TYPE_INCLINIC
Battery Voltage: 2.99 V
Brady Statistic AP VP Percent: 0.01 %
Brady Statistic AP VS Percent: 99.85 %
Brady Statistic AS VP Percent: 0 %
Brady Statistic AS VS Percent: 0.13 %
Brady Statistic RA Percent Paced: 99.87 %
Date Time Interrogation Session: 20151209195411
Lead Channel Impedance Value: 560 Ohm
Lead Channel Pacing Threshold Amplitude: 0.5 V
Lead Channel Pacing Threshold Amplitude: 1 V
Lead Channel Pacing Threshold Pulse Width: 0.4 ms
Lead Channel Pacing Threshold Pulse Width: 0.4 ms
Lead Channel Setting Pacing Amplitude: 2.5 V
Lead Channel Setting Sensing Sensitivity: 0.9 mV
MDC IDC MSMT LEADCHNL RA IMPEDANCE VALUE: 496 Ohm
MDC IDC MSMT LEADCHNL RA SENSING INTR AMPL: 2.4805
MDC IDC MSMT LEADCHNL RV SENSING INTR AMPL: 8.969
MDC IDC SET LEADCHNL RA PACING AMPLITUDE: 2 V
MDC IDC SET LEADCHNL RV PACING PULSEWIDTH: 0.4 ms
MDC IDC SET ZONE DETECTION INTERVAL: 350 ms
MDC IDC STAT BRADY RV PERCENT PACED: 0.02 %
Zone Setting Detection Interval: 400 ms

## 2014-11-07 MED ORDER — FLECAINIDE ACETATE 50 MG PO TABS: 50.0000 mg | ORAL_TABLET | Freq: Two times a day (BID) | ORAL | Status: DC

## 2014-11-07 MED ORDER — FUROSEMIDE 20 MG PO TABS: 20.0000 mg | ORAL_TABLET | Freq: Every day | ORAL | Status: DC

## 2014-11-07 NOTE — Progress Notes (Signed)
Patient Care Team: Irven Shelling, MD as PCP - General (Internal Medicine)   HPI  Anne Macias is a 80 y.o. female Seen in followup for a pacemaker implanted for tachybradycardia syndrome. She has documented atrial fibrillation and is on warfarin.   She underwent cardioversion last week in the context of flecainide which she was tolerating poorly and we done titrated the dose. Begin her on propafenone and undertook cardioversion again. She sustains sinus rhythm.  Edema got better on a diuretic; she is currently not taking.  She has noticed a little bit of tremor  her other major issue has been alopecia. She's been on long-standing beta blockers but this is new since the introduction of the propafenone. So Mr. 2   Echocardiogram 2011 demonstrated normal LV systolic function    Past Medical History  Diagnosis Date  . Edema of lower extremity   . PAF (paroxysmal atrial fibrillation) 1998  . Hypertension   . Hyperlipidemia   . Visual disturbance   . Tachy-brady syndrome   . Chronic anticoagulation   . S/P cardiac pacemaker procedure Sept 7564    complicated by pocket hematoma  . Tremor 05/08/2014    Past Surgical History  Procedure Laterality Date  . Insert / replace / remove pacemaker  08/28/2010    implantaton  . Cholecystectomy  2005  . Cataract extraction    . US echocardiography  04/10/2010    EF 55-60%  . Cardioversion N/A 02/26/2014    Procedure: CARDIOVERSION;  Surgeon: Deboraha Sprang, MD;  Location: [place];  Service: Cardiovascular;  Laterality: N/A;  . Cardioversion N/A 03/19/2014    Procedure: CARDIOVERSION;  Surgeon: Jillana Spark, MD;  Location: [facility] ENDOSCOPY;  Service: Cardiovascular;  Laterality: N/A;    Current Outpatient Prescriptions  Medication Sig Dispense Refill  . Calcium Carbonate-Vitamin D (CALTRATE 600+D PO) Take 2 tablets by mouth daily.      Marland Kitchen docusate sodium (COLACE) 100 MG capsule Take 100 mg by mouth 2 (two) times  daily.    . metoprolol succinate (TOPROL-XL) 100 MG 24 hr tablet Take 1 tablet (100 mg total) by mouth 2 (two) times daily. Take with or immediately following a meal. 180 tablet 2  . propafenone (RYTHMOL) 225 MG tablet Take 1 tablet (225 mg total) by mouth 2 (two) times daily. 180 tablet 2  . simvastatin (ZOCOR) 40 MG tablet Take 40 mg by mouth every evening.      . warfarin (COUMADIN) 5 MG tablet Take 5 mg by mouth as directed.      No current facility-administered medications for this visit.    No Known Allergies  Review of Systems negative except from HPI and PMH  Physical Exam Ht 5\' 7"  (1.702 m)  Wt 137 lb (62.143 kg)  BMI 21.45 kg/m2 Well developed and well nourished in no acute distress HENT normal E scleral and icterus clear Neck Supple JVP flat; carotids brisk and full Clear to ausculation  Regular rate and rhythm, no murmurs gallops or rub Soft with active bowel sounds 1+ Edema Alert and oriented, grossly normal motor and sensory function Skin Warm and Dry   ECG demonstrates atrial pacing with intrinsic conduction I rate is 71 Intervals 20/14/42 Bundle branch block  Assessment and  Plan  Atrial fibrillation  Sinus node dysfunction  Pacemaker-Medtronic The patient's device was interrogated.  The information was reviewed. No changes were made in the programming.    Alopecia  Edema  Hypertension  Tremor  The alopecia may well be related to the propafenone although I will have to look at the mechanism of it compared to standard beta blockers. We will hold the alopecia is essentially major issue for her and we'll see what happens to the hair loss. It may well be that she comes to the point of having to stop her metoprolol as well.  Her edema remains an issue. We will try her on low-dose furosemide for 3 days and see if we can't address this. HCT was started and it was discontinued her PCP.  We'll need to continue to track her tremor which we think is related  to the propafenone; hopefully it will improve.   s relates to her atrial fibrillation we will resume her on low-dose flecainide. She's not sure that she will be tolerated better. In the event that she is unable to tolerate treated with amiodarone

## 2014-11-07 NOTE — Patient Instructions (Addendum)
Your physician has recommended you make the following change in your medication:  1) STOP Rhythmol 2) START Flecainide 50 mg twice daily 3) TAKE Furosemide 20 mg daily for 3 days  Remote monitoring is used to monitor your Pacemaker of ICD from home. This monitoring reduces the number of office visits required to check your device to one time per year. It allows [place] to keep an eye on the functioning of your device to ensure it is working properly. You are scheduled for a device check from home on 02/06/2015. You may send your transmission at any time that day. If you have a wireless device, the transmission will be sent automatically. After your physician reviews your transmission, you will receive a postcard with your next transmission date.  Your physician recommends that you schedule a follow-up appointment in: 6 weeks with Dr. Caryl Comes.  Marland Kitchen

## 2014-11-12 DIAGNOSIS — H1030 Unspecified acute conjunctivitis, unspecified eye: Secondary | ICD-10-CM | POA: Diagnosis not present

## 2014-11-12 DIAGNOSIS — H3530 Unspecified macular degeneration: Secondary | ICD-10-CM | POA: Diagnosis not present

## 2014-11-21 DIAGNOSIS — Z7901 Long term (current) use of anticoagulants: Secondary | ICD-10-CM | POA: Diagnosis not present

## 2014-11-21 DIAGNOSIS — I4891 Unspecified atrial fibrillation: Secondary | ICD-10-CM | POA: Diagnosis not present

## 2014-12-13 DIAGNOSIS — H01119 Allergic dermatitis of unspecified eye, unspecified eyelid: Secondary | ICD-10-CM | POA: Diagnosis not present

## 2014-12-13 DIAGNOSIS — Z883 Allergy status to other anti-infective agents status: Secondary | ICD-10-CM | POA: Diagnosis not present

## 2014-12-18 ENCOUNTER — Telehealth: Payer: Self-pay | Admitting: Internal Medicine

## 2014-12-18 NOTE — Telephone Encounter (Signed)
Pt called and stated that she and her husband does not want to follow up remotely they want to come into office every 6 months to have pacemakers interrogated.

## 2014-12-18 NOTE — Telephone Encounter (Signed)
New message       Talk to someone in the device clinic regarding a letter she received to upgrade her device.  She want to talk to someone about this

## 2014-12-19 ENCOUNTER — Telehealth: Payer: Self-pay | Admitting: Internal Medicine

## 2014-12-19 DIAGNOSIS — I4891 Unspecified atrial fibrillation: Secondary | ICD-10-CM | POA: Diagnosis not present

## 2014-12-19 DIAGNOSIS — Z7901 Long term (current) use of anticoagulants: Secondary | ICD-10-CM | POA: Diagnosis not present

## 2014-12-19 NOTE — Telephone Encounter (Signed)
Pt does not want to remote follow up. Wants to come into office 2 times a year to have PPM interrogated. Pt aware that this is ok.

## 2014-12-19 NOTE — Telephone Encounter (Signed)
New message      Talk to someone regarding upgrading her pacemaker box

## 2014-12-31 ENCOUNTER — Other Ambulatory Visit: Payer: Self-pay | Admitting: Internal Medicine

## 2015-01-09 DIAGNOSIS — Z7901 Long term (current) use of anticoagulants: Secondary | ICD-10-CM | POA: Diagnosis not present

## 2015-01-09 DIAGNOSIS — I4891 Unspecified atrial fibrillation: Secondary | ICD-10-CM | POA: Diagnosis not present

## 2015-01-23 ENCOUNTER — Other Ambulatory Visit: Payer: Self-pay

## 2015-01-23 DIAGNOSIS — I4891 Unspecified atrial fibrillation: Secondary | ICD-10-CM

## 2015-01-23 MED ORDER — METOPROLOL SUCCINATE ER 100 MG PO TB24: 100.0000 mg | ORAL_TABLET | Freq: Two times a day (BID) | ORAL | Status: DC

## 2015-01-28 ENCOUNTER — Ambulatory Visit (INDEPENDENT_AMBULATORY_CARE_PROVIDER_SITE_OTHER): Payer: Medicare Other | Admitting: Internal Medicine

## 2015-01-28 ENCOUNTER — Encounter: Payer: Self-pay | Admitting: Internal Medicine

## 2015-01-28 VITALS — BP 140/90 | HR 71 | Ht 65.0 in | Wt 140.8 lb

## 2015-01-28 DIAGNOSIS — I495 Sick sinus syndrome: Secondary | ICD-10-CM | POA: Diagnosis not present

## 2015-01-28 DIAGNOSIS — Z45018 Encounter for adjustment and management of other part of cardiac pacemaker: Secondary | ICD-10-CM

## 2015-01-28 DIAGNOSIS — I480 Paroxysmal atrial fibrillation: Secondary | ICD-10-CM | POA: Diagnosis not present

## 2015-01-28 DIAGNOSIS — Z950 Presence of cardiac pacemaker: Secondary | ICD-10-CM

## 2015-01-28 LAB — MDC_IDC_ENUM_SESS_TYPE_INCLINIC
Brady Statistic AP VS Percent: 89.59 %
Brady Statistic AS VP Percent: 0.37 %
Brady Statistic AS VS Percent: 9.92 %
Brady Statistic RA Percent Paced: 89.71 %
Brady Statistic RV Percent Paced: 0.49 %
Date Time Interrogation Session: 20160229141523
Lead Channel Impedance Value: 464 Ohm
Lead Channel Impedance Value: 576 Ohm
Lead Channel Pacing Threshold Amplitude: 1 V
Lead Channel Pacing Threshold Pulse Width: 0.4 ms
Lead Channel Pacing Threshold Pulse Width: 0.4 ms
Lead Channel Sensing Intrinsic Amplitude: 11 mV
Lead Channel Setting Pacing Amplitude: 2.5 V
Lead Channel Setting Sensing Sensitivity: 0.9 mV
MDC IDC MSMT BATTERY VOLTAGE: 2.99 V
MDC IDC MSMT LEADCHNL RA PACING THRESHOLD AMPLITUDE: 1 V
MDC IDC MSMT LEADCHNL RA SENSING INTR AMPL: 2.7 mV
MDC IDC SET LEADCHNL RA PACING AMPLITUDE: 2 V
MDC IDC SET LEADCHNL RV PACING PULSEWIDTH: 0.4 ms
MDC IDC STAT BRADY AP VP PERCENT: 0.12 %
Zone Setting Detection Interval: 350 ms
Zone Setting Detection Interval: 400 ms

## 2015-01-28 MED ORDER — FLECAINIDE ACETATE 50 MG PO TABS: 50.0000 mg | ORAL_TABLET | Freq: Two times a day (BID) | ORAL | Status: DC

## 2015-01-28 NOTE — Patient Instructions (Signed)
Your physician recommends that you continue on your current medications as directed. Please refer to the Current Medication list given to you today.  Your physician wants you to follow-up in: 6 months with Dr. Klein. You will receive a reminder letter in the mail two months in advance. If you don't receive a letter, please call our office to schedule the follow-up appointment.  

## 2015-01-28 NOTE — Progress Notes (Signed)
Patient Care Team: Irven Shelling, MD as PCP - General (Internal Medicine)   HPI  Anne Macias is a 81 y.o. female Seen in followup for a pacemaker implanted for tachybradycardia syndrome. She has documented atrial fibrillation and is on warfarin.   She underwent cardioversion last week in the context of flecainide which she was tolerating poorly and we done titrated the dose. Begin her on propafenone and undertook cardioversion again. She sustains sinus rhythm.    Edema got better on a diuretic; she is currently not taking. Her PCP and advised her not to do this.  She has noticed a little bit of tremor  her other major issue has been alopecia. It has resolved largely off of the propafenone notwithstanding maintaining of her metoprolol  Echocardiogram 2011 demonstrated normal LV systolic function    Past Medical History  Diagnosis Date  . Edema of lower extremity   . PAF (paroxysmal atrial fibrillation) 1998  . Hypertension   . Hyperlipidemia   . Visual disturbance   . Tachy-brady syndrome   . Chronic anticoagulation   . S/P cardiac pacemaker procedure Sept 6629    complicated by pocket hematoma  . Tremor 05/08/2014    Past Surgical History  Procedure Laterality Date  . Insert / replace / remove pacemaker  08/28/2010    implantaton  . Cholecystectomy  2005  . Cataract extraction    . US echocardiography  04/10/2010    EF 55-60%  . Cardioversion N/A 02/26/2014    Procedure: CARDIOVERSION;  Surgeon: Deboraha Sprang, MD;  Location: [place];  Service: Cardiovascular;  Laterality: N/A;  . Cardioversion N/A 03/19/2014    Procedure: CARDIOVERSION;  Surgeon: Maeby Spark, MD;  Location: [facility] ENDOSCOPY;  Service: Cardiovascular;  Laterality: N/A;    Current Outpatient Prescriptions  Medication Sig Dispense Refill  . Calcium Carbonate-Vitamin D (CALTRATE 600+D PO) Take 2 tablets by mouth daily.      Marland Kitchen docusate sodium (COLACE) 100 MG capsule Take 100 mg by  mouth 2 (two) times daily.    . flecainide (TAMBOCOR) 50 MG tablet TAKE 1 TABLET (50 MG TOTAL) BY MOUTH 2 (TWO) TIMES DAILY. 60 tablet 1  . furosemide (LASIX) 20 MG tablet Take 1 tablet (20 mg total) by mouth daily. For three days 3 tablet 0  . metoprolol succinate (TOPROL-XL) 100 MG 24 hr tablet Take 1 tablet (100 mg total) by mouth 2 (two) times daily. Take with or immediately following a meal. 180 tablet 0  . simvastatin (ZOCOR) 40 MG tablet Take 40 mg by mouth every evening.      . warfarin (COUMADIN) 5 MG tablet Take 5 mg by mouth as directed.      No current facility-administered medications for this visit.    No Known Allergies  Review of Systems negative except from HPI and PMH  Physical Exam BP 140/90 mmHg  Pulse 71  Ht 5\' 5"  (1.651 m)  Wt 140 lb 12.8 oz (63.866 kg)  BMI 23.43 kg/m2 Well developed and well nourished in no acute distress HENT normal E scleral and icterus clear Neck Supple JVP flat; carotids brisk and full Clear to ausculation  Regular rate and rhythm, no murmurs gallops or rub Soft with active bowel sounds 1+ Edema Alert and oriented, grossly normal motor and sensory function Skin Warm and Dry   ECG demonstrates atrial pacing with intrinsic conduction  rate is 71 Intervals 20/13/43 Right Bundle branch block  Assessment and  Plan  Atrial fibrillation  Sinus node dysfunction  Pacemaker-Medtronic The patient's device was interrogated.  The information was reviewed. No changes were made in the programming.    Alopecia  Edema  Hypertension     The alopecia seems to have been related to the propafenone; it is much improved. Surprisingly it has improved not withstanding the maintaining of metoprolol.  Her edema remains an issue. Her PCP however has asked her not to take it. We have reviewed the physiology of edema is relationship to salt and fluid intake and the option of taking it on an as-needed basis to mitigate  .She is tolerating the  flecainide well without intercurrent atrial fibrillation  Blood pressures is stable

## 2015-02-06 DIAGNOSIS — I4891 Unspecified atrial fibrillation: Secondary | ICD-10-CM | POA: Diagnosis not present

## 2015-02-06 DIAGNOSIS — Z7901 Long term (current) use of anticoagulants: Secondary | ICD-10-CM | POA: Diagnosis not present

## 2015-02-20 DIAGNOSIS — Z7901 Long term (current) use of anticoagulants: Secondary | ICD-10-CM | POA: Diagnosis not present

## 2015-02-20 DIAGNOSIS — I4891 Unspecified atrial fibrillation: Secondary | ICD-10-CM | POA: Diagnosis not present

## 2015-03-06 DIAGNOSIS — Z7901 Long term (current) use of anticoagulants: Secondary | ICD-10-CM | POA: Diagnosis not present

## 2015-03-06 DIAGNOSIS — I4891 Unspecified atrial fibrillation: Secondary | ICD-10-CM | POA: Diagnosis not present

## 2015-03-20 DIAGNOSIS — I4891 Unspecified atrial fibrillation: Secondary | ICD-10-CM | POA: Diagnosis not present

## 2015-03-20 DIAGNOSIS — Z7901 Long term (current) use of anticoagulants: Secondary | ICD-10-CM | POA: Diagnosis not present

## 2015-04-03 DIAGNOSIS — Z7901 Long term (current) use of anticoagulants: Secondary | ICD-10-CM | POA: Diagnosis not present

## 2015-04-03 DIAGNOSIS — I4891 Unspecified atrial fibrillation: Secondary | ICD-10-CM | POA: Diagnosis not present

## 2015-04-22 ENCOUNTER — Other Ambulatory Visit: Payer: Self-pay | Admitting: Internal Medicine

## 2015-05-02 DIAGNOSIS — I4891 Unspecified atrial fibrillation: Secondary | ICD-10-CM | POA: Diagnosis not present

## 2015-05-02 DIAGNOSIS — Z7901 Long term (current) use of anticoagulants: Secondary | ICD-10-CM | POA: Diagnosis not present

## 2015-05-10 DIAGNOSIS — Z1389 Encounter for screening for other disorder: Secondary | ICD-10-CM | POA: Diagnosis not present

## 2015-05-10 DIAGNOSIS — K5900 Constipation, unspecified: Secondary | ICD-10-CM | POA: Diagnosis not present

## 2015-05-10 DIAGNOSIS — E780 Pure hypercholesterolemia: Secondary | ICD-10-CM | POA: Diagnosis not present

## 2015-05-10 DIAGNOSIS — M810 Age-related osteoporosis without current pathological fracture: Secondary | ICD-10-CM | POA: Diagnosis not present

## 2015-05-10 DIAGNOSIS — I10 Essential (primary) hypertension: Secondary | ICD-10-CM | POA: Diagnosis not present

## 2015-05-10 DIAGNOSIS — I480 Paroxysmal atrial fibrillation: Secondary | ICD-10-CM | POA: Diagnosis not present

## 2015-05-10 DIAGNOSIS — Z0000 Encounter for general adult medical examination without abnormal findings: Secondary | ICD-10-CM | POA: Diagnosis not present

## 2015-05-10 DIAGNOSIS — R7301 Impaired fasting glucose: Secondary | ICD-10-CM | POA: Diagnosis not present

## 2015-05-16 DIAGNOSIS — H35363 Drusen (degenerative) of macula, bilateral: Secondary | ICD-10-CM | POA: Diagnosis not present

## 2015-05-16 DIAGNOSIS — H3531 Nonexudative age-related macular degeneration: Secondary | ICD-10-CM | POA: Diagnosis not present

## 2015-05-23 ENCOUNTER — Telehealth: Payer: Self-pay | Admitting: Internal Medicine

## 2015-05-23 NOTE — Telephone Encounter (Signed)
Spoke with patient who states she is not super concerned but felt she should call to let Dr. Caryl Comes know that her heart rate is 105.  She states she is not certain that she is in atrial fib, states she feels some "thumping in her chest when she lays down."  Denies dizziness, light-headedness, or SOB.  She verbalizes compliance with all medications including Coumadin, Metoprolol, and Flecainide.  I scheduled her for an ekg/nurse visit tomorrow at 9:15 and advised her to continue to monitor pulse and BP today and to call back if heart rate is >110's, or if she becomes light-headed, dizzy, or develops SOB or other concerns.  Patient verbalized understanding and agreement and thanked me for the prompt follow-up.

## 2015-05-23 NOTE — Telephone Encounter (Signed)
New Message        Pt calling stating that she has had an irregular heartbeat (beating fast) for the past couple of days. BP133/90 P105. Please call back and advise.

## 2015-05-24 ENCOUNTER — Ambulatory Visit (INDEPENDENT_AMBULATORY_CARE_PROVIDER_SITE_OTHER): Payer: Medicare Other | Admitting: *Deleted

## 2015-05-24 ENCOUNTER — Encounter: Payer: Self-pay | Admitting: *Deleted

## 2015-05-24 ENCOUNTER — Telehealth: Payer: Self-pay | Admitting: *Deleted

## 2015-05-24 VITALS — BP 137/90 | HR 94 | Resp 16

## 2015-05-24 DIAGNOSIS — I480 Paroxysmal atrial fibrillation: Secondary | ICD-10-CM

## 2015-05-24 DIAGNOSIS — I10 Essential (primary) hypertension: Secondary | ICD-10-CM

## 2015-05-24 DIAGNOSIS — Z7901 Long term (current) use of anticoagulants: Secondary | ICD-10-CM

## 2015-05-24 NOTE — Progress Notes (Signed)
Patient in for EKG/BP check. BP 137/90, HR 94. EKG shows AFib - reviewed by Dr. Caryl Comes. Patient states she feels a little "tired" but denies SOB, cough or dizziness. States at night she feels her heart beating heavy. Been going on for a few days. Previously cardioverted April, 2015. Dr. Caryl Comes spoke with patient. Plan is to cardiovert patient next week. Will notify patient when cardioversion scheduled.

## 2015-05-24 NOTE — Telephone Encounter (Signed)
Scheduling for Cardioversion - Case #: [id number] on Wednesday, June 29th at 1:00 pm with Dr. Sallyanne Kuster. Labs ordered and pt instruction letter left at front desk. Notified patient and went over instructions. Patient verbalized understanding and repeated instructions, including to be there at 11:45 am prior to the procedure and to come in office Monday or Tuesday to have lab work done and pick up instruction letter. Patient to come by Monday or Tuesday for lab work. Also Terrence Dupont, [employer], will notify [employer] Device Rep regarding Cardioversion date/time.

## 2015-05-28 ENCOUNTER — Other Ambulatory Visit (INDEPENDENT_AMBULATORY_CARE_PROVIDER_SITE_OTHER): Payer: Medicare Other | Admitting: *Deleted

## 2015-05-28 DIAGNOSIS — I10 Essential (primary) hypertension: Secondary | ICD-10-CM | POA: Diagnosis not present

## 2015-05-28 DIAGNOSIS — Z7901 Long term (current) use of anticoagulants: Secondary | ICD-10-CM | POA: Diagnosis not present

## 2015-05-28 DIAGNOSIS — I480 Paroxysmal atrial fibrillation: Secondary | ICD-10-CM

## 2015-05-28 DIAGNOSIS — Z5181 Encounter for therapeutic drug level monitoring: Secondary | ICD-10-CM | POA: Diagnosis not present

## 2015-05-28 LAB — PROTIME-INR
INR: 2.8 ratio — ABNORMAL HIGH (ref 0.8–1.0)
PROTHROMBIN TIME: 30 s — AB (ref 9.6–13.1)

## 2015-05-28 LAB — CBC
HCT: 39.1 % (ref 36.0–46.0)
Hemoglobin: 12.6 g/dL (ref 12.0–15.0)
MCHC: 32.1 g/dL (ref 30.0–36.0)
MCV: 83.1 fl (ref 78.0–100.0)
Platelets: 195 10*3/uL (ref 150.0–400.0)
RBC: 4.71 Mil/uL (ref 3.87–5.11)
RDW: 15.4 % (ref 11.5–15.5)
WBC: 7.5 10*3/uL (ref 4.0–10.5)

## 2015-05-28 LAB — BASIC METABOLIC PANEL
BUN: 24 mg/dL — ABNORMAL HIGH (ref 6–23)
CO2: 28 meq/L (ref 19–32)
CREATININE: 1.07 mg/dL (ref 0.40–1.20)
Calcium: 8.8 mg/dL (ref 8.4–10.5)
Chloride: 106 mEq/L (ref 96–112)
GFR: 51.18 mL/min — ABNORMAL LOW (ref >60.00)
Glucose, Bld: 114 mg/dL — ABNORMAL HIGH (ref 70–99)
Potassium: 4.6 mEq/L (ref 3.5–5.1)
SODIUM: 140 meq/L (ref 135–145)

## 2015-05-29 ENCOUNTER — Ambulatory Visit (HOSPITAL_COMMUNITY)
Admission: RE | Admit: 2015-05-29 | Discharge: 2015-05-29 | Disposition: A | Discharge status: Home / Self Care | Payer: Medicare Other | Source: Ambulatory Visit | Attending: Cardiovascular Disease | Admitting: Cardiovascular Disease

## 2015-05-29 ENCOUNTER — Ambulatory Visit (HOSPITAL_COMMUNITY): Payer: Medicare Other | Admitting: Anesthesiology

## 2015-05-29 ENCOUNTER — Encounter (HOSPITAL_COMMUNITY): Payer: Self-pay | Admitting: *Deleted

## 2015-05-29 ENCOUNTER — Encounter (HOSPITAL_COMMUNITY)
Admission: RE | Disposition: A | Discharge status: Home / Self Care | Payer: Self-pay | Source: Ambulatory Visit | Attending: Cardiovascular Disease

## 2015-05-29 DIAGNOSIS — E785 Hyperlipidemia, unspecified: Secondary | ICD-10-CM | POA: Diagnosis not present

## 2015-05-29 DIAGNOSIS — I10 Essential (primary) hypertension: Secondary | ICD-10-CM | POA: Diagnosis not present

## 2015-05-29 DIAGNOSIS — I480 Paroxysmal atrial fibrillation: Secondary | ICD-10-CM | POA: Diagnosis not present

## 2015-05-29 DIAGNOSIS — R002 Palpitations: Secondary | ICD-10-CM | POA: Diagnosis present

## 2015-05-29 DIAGNOSIS — I495 Sick sinus syndrome: Secondary | ICD-10-CM

## 2015-05-29 DIAGNOSIS — Z950 Presence of cardiac pacemaker: Secondary | ICD-10-CM | POA: Insufficient documentation

## 2015-05-29 DIAGNOSIS — Z79899 Other long term (current) drug therapy: Secondary | ICD-10-CM | POA: Diagnosis not present

## 2015-05-29 DIAGNOSIS — Z539 Procedure and treatment not carried out, unspecified reason: Secondary | ICD-10-CM | POA: Insufficient documentation

## 2015-05-29 DIAGNOSIS — I481 Persistent atrial fibrillation: Secondary | ICD-10-CM

## 2015-05-29 SURGERY — CANCELLED PROCEDURE
Anesthesia: General

## 2015-05-29 MED ORDER — SODIUM CHLORIDE 0.9 % IV SOLN
INTRAVENOUS | Status: DC
  Administered 2015-05-29: 12:00:00 via INTRAVENOUS

## 2015-05-29 NOTE — Anesthesia Preprocedure Evaluation (Addendum)
Anesthesia Evaluation  Patient identified by MRN, date of birth, ID band Patient awake    Reviewed: Allergy & Precautions, NPO status , Patient's Chart, lab work & pertinent test results  Airway Mallampati: II  TM Distance: >3 FB Neck ROM: Full    Dental  (+) Teeth Intact, Dental Advisory Given, Caps   Pulmonary neg pulmonary ROS,    Pulmonary exam normal       Cardiovascular hypertension, + dysrhythmias Atrial Fibrillation + pacemaker Rhythm:Irregular Rate:Normal     Neuro/Psych negative neurological ROS  negative psych ROS   GI/Hepatic negative GI ROS, Neg liver ROS,   Endo/Other    Renal/GU      Musculoskeletal   Abdominal   Peds  Hematology   Anesthesia Other Findings   Reproductive/Obstetrics                           Anesthesia Physical Anesthesia Plan  ASA: III  Anesthesia Plan: General   Post-op Pain Management:    Induction: Intravenous  Airway Management Planned: Mask  Additional Equipment:   Intra-op Plan:   Post-operative Plan:   Informed Consent: I have reviewed the patients History and Physical, chart, labs and discussed the procedure including the risks, benefits and alternatives for the proposed anesthesia with the patient or authorized representative who has indicated his/her understanding and acceptance.   Dental advisory given  Plan Discussed with: CRNA, Anesthesiologist and Surgeon  Anesthesia Plan Comments:        Anesthesia Quick Evaluation

## 2015-05-29 NOTE — H&P (Signed)
Cardiology Office Note   Date:  05/29/2015   ID:  Anne Macias, DOB 04/13/1923, MRN [id number]  PCP:  Irven Shelling, MD  Cardiologist:  Virl Axe, MD Chief complaint: palpitations.     History of Present Illness: Anne Macias is a 81 y.o. female who presents for elective cardioversion for symptomatic atrial fibrillation breakthrough on chronic flecainide therapy. Last pacemaker interrogation shows 90% A paced rhythm, 10% AFib burden, virtually zero ventricular pacing (Medtronic Revo 2011) She has undergone cardioversion for symptomatic sustained atrial fibrillation at least twice before, most recently March 2015 and April 2015. Normal LV systolic function, 2+ MR and 2+ TR, mild-moderate LA dilation by echo before last cardioversion. Unfortunately, INR on June 2 was subtherapeutic at 1.6 and not rechecked until INR 2.8 today, usually followed at [facility].   Past Medical History  Diagnosis Date  . Edema of lower extremity   . PAF (paroxysmal atrial fibrillation) 1998  . Hypertension   . Hyperlipidemia   . Visual disturbance   . Tachy-brady syndrome   . Chronic anticoagulation   . S/P cardiac pacemaker procedure Sept 6269    complicated by pocket hematoma  . Tremor 05/08/2014    Past Surgical History  Procedure Laterality Date  . Insert / replace / remove pacemaker  08/28/2010    implantaton  . Cholecystectomy  2005  . Cataract extraction    . US echocardiography  04/10/2010    EF 55-60%  . Cardioversion N/A 02/26/2014    Procedure: CARDIOVERSION;  Surgeon: Deboraha Sprang, MD;  Location: [place];  Service: Cardiovascular;  Laterality: N/A;  . Cardioversion N/A 03/19/2014    Procedure: CARDIOVERSION;  Surgeon: Katiria Spark, MD;  Location: [place];  Service: Cardiovascular;  Laterality: N/A;     No current facility-administered medications on file prior to encounter.   Current Outpatient Prescriptions on File Prior to Encounter  Medication Sig  Dispense Refill  . Calcium Carbonate-Vitamin D (CALTRATE 600+D PO) Take 2 tablets by mouth daily.      . flecainide (TAMBOCOR) 50 MG tablet Take 1 tablet (50 mg total) by mouth 2 (two) times daily. 180 tablet 1  . simvastatin (ZOCOR) 40 MG tablet Take 40 mg by mouth every evening.      . furosemide (LASIX) 20 MG tablet Take 1 tablet (20 mg total) by mouth daily. For three days (Patient not taking: Reported on 05/28/2015) 3 tablet 0     Allergies:   Review of patient's allergies indicates no known allergies.    Social History:  The patient  reports that she has never smoked. She does not have any smokeless tobacco history on file. She reports that she does not drink alcohol or use illicit drugs.   Family History:  The patient's family history includes Alcohol abuse in her brother; Heart attack in her brother and mother; Pneumonia in her father.    ROS:  Please see the history of present illness.    Otherwise, review of systems positive for palpitations, tremor, fatigue.   All other systems are reviewed and negative.    PHYSICAL EXAM: VS:  BP 139/107 mmHg  Pulse 100  Resp 18  Ht 5\' 5"  (1.651 m)  Wt 140 lb (63.504 kg)  BMI 23.30 kg/m2  SpO2 97% , BMI Body mass index is 23.3 kg/(m^2).  General: Alert, oriented x3, no distress Head: no evidence of trauma, PERRL, EOMI, no exophtalmos or lid lag, no myxedema, no xanthelasma; normal ears, nose and oropharynx  Neck: normal jugular venous pulsations and no hepatojugular reflux; brisk carotid pulses without delay and no carotid bruits Chest: clear to auscultation, no signs of consolidation by percussion or palpation, normal fremitus, symmetrical and full respiratory excursions Cardiovascular: normal position and quality of the apical impulse, irregular rhythm, normal first and widely split second heart sounds, no murmurs, rubs or gallops Abdomen: no tenderness or distention, no masses by palpation, no abnormal pulsatility or arterial bruits,  normal bowel sounds, no hepatosplenomegaly Extremities: no clubbing, cyanosis or edema; 2+ radial, ulnar and brachial pulses bilaterally; 2+ right femoral, posterior tibial and dorsalis pedis pulses; 2+ left femoral, posterior tibial and dorsalis pedis pulses; no subclavian or femoral bruits Neurological: grossly nonfocal Psych: euthymic mood, full affect   EKG:  EKG is not ordered today. The ekg ordered 6/24 shows atrial fibrillation with RVR and RBBB   Recent Labs: 05/28/2015: BUN 24*; Creatinine, Ser 1.07; Hemoglobin 12.6; Platelets 195.0; Potassium 4.6; Sodium 140     Wt Readings from Last 3 Encounters:  05/29/15 140 lb (63.504 kg)  01/28/15 140 lb 12.8 oz (63.866 kg)  11/07/14 137 lb (62.143 kg)      ASSESSMENT AND PLAN:  Defer planned cardioversion until 3 weeks of confirmed therapeutic anticoagulation.  Mikael Spray, MD  05/29/2015 12:32 PM    Sanda Klein, MD, [facility] [phone] office [phone] pager

## 2015-05-31 ENCOUNTER — Telehealth: Payer: Self-pay | Admitting: Internal Medicine

## 2015-05-31 NOTE — Telephone Encounter (Signed)
Explained that patient need to call [facility] to discuss Coumadin dosing.  She and I agreed to speak next week about rescheduling DCCV.

## 2015-05-31 NOTE — Telephone Encounter (Signed)
New problem    Pt was sched for cardioversion and on the table for it to be performed but then was told they couldn't do it because of her blood. Pt want to speak to nurse concerning getting her blood elevated. Please call pt.

## 2015-06-04 ENCOUNTER — Telehealth: Payer: Self-pay | Admitting: Internal Medicine

## 2015-06-04 DIAGNOSIS — Z7901 Long term (current) use of anticoagulants: Secondary | ICD-10-CM | POA: Diagnosis not present

## 2015-06-04 DIAGNOSIS — I4891 Unspecified atrial fibrillation: Secondary | ICD-10-CM | POA: Diagnosis not present

## 2015-06-04 LAB — PROTIME-INR: INR: 3 — AB (ref 0.9–1.1)

## 2015-06-04 NOTE — Telephone Encounter (Signed)
Returned call to patient she stated she wanted Dr.Klein's nurse Judeen Hammans to know PCP prescribed furosemide today.Stated she has 1 more INR to have done before procedure can be scheduled.Advised ok to take furosemide as prescribed.I will send message to [facility].

## 2015-06-04 NOTE — Telephone Encounter (Signed)
Followin  Up  Pt requested to speak w/ Sherri concerning information about procedure. Please call back and discuss.

## 2015-06-10 DIAGNOSIS — Z7901 Long term (current) use of anticoagulants: Secondary | ICD-10-CM | POA: Diagnosis not present

## 2015-06-10 DIAGNOSIS — R609 Edema, unspecified: Secondary | ICD-10-CM | POA: Diagnosis not present

## 2015-06-10 LAB — PROTIME-INR: INR: 2.2 — AB (ref 0.9–1.1)

## 2015-06-10 NOTE — Telephone Encounter (Signed)
Follow Up  Pt called back to give her coumadin readings so that she can get her procedure scheduled.

## 2015-06-10 NOTE — Telephone Encounter (Signed)
Informed patient I would call her tomorrow to arrange/schedule. She is agreeable to plan

## 2015-06-11 NOTE — Telephone Encounter (Signed)
Informed patient I will obtain INRs reading from Dr. Delene Ruffini office.  Will arrange DCCV if numbers are WNL. She is aware I will call her back today/Thursday to review date/instructions.

## 2015-06-12 ENCOUNTER — Telehealth: Payer: Self-pay | Admitting: Internal Medicine

## 2015-06-12 NOTE — Telephone Encounter (Signed)
New message    Pt is to have a cardio version and pt is wanting to know if you have spoken with PCP regarding the coumadin levels and whether this has been scheduled or not.   Please call to advise

## 2015-06-12 NOTE — Telephone Encounter (Signed)
Pt is to have a cardio version and pt is wanting to know if you have spoken with PCP regarding the coumadin levels and whether this has been scheduled or not.  Please call to advise   I talked to the patient Wednesday and told her that I would forward her question above to Dr. Olin Pia nurse Trinidad Curet since she is off today

## 2015-06-13 ENCOUNTER — Telehealth: Payer: Self-pay | Admitting: Internal Medicine

## 2015-06-13 ENCOUNTER — Encounter: Payer: Self-pay | Admitting: Internal Medicine

## 2015-06-13 NOTE — Telephone Encounter (Signed)
Scheduled DCCV for Monday 7/18 at 1:30 pm.  Pt aware to be at hospital at noon. Reviewed procedure instructions with patient: NPO after MN, no Lasix morning of procedure, must have someone drive her home after DCCV. Instructed to take morning medications with small amount of water, no Lasix. She will have pre procedure labs drawn at hospital morning of procedure.  Patient verbalized understanding and agreeable to plan.   Recent INRs: 6/28   2.8    (in EPIC system) 7/05   3.0    (scanned into EPIC) 7/11   2.2    (scanned into EPIC)

## 2015-06-13 NOTE — Telephone Encounter (Signed)
New message     Pt want Sherri to know that she is waiting to have her cardioversion scheduled.  She is hoping it will be soon because she is feeling weaker and weaker every day

## 2015-06-17 ENCOUNTER — Ambulatory Visit (HOSPITAL_COMMUNITY): Payer: Medicare Other | Admitting: Anesthesiology

## 2015-06-17 ENCOUNTER — Encounter (HOSPITAL_COMMUNITY): Payer: Self-pay | Admitting: *Deleted

## 2015-06-17 ENCOUNTER — Ambulatory Visit (HOSPITAL_COMMUNITY)
Admission: RE | Admit: 2015-06-17 | Discharge: 2015-06-17 | Disposition: A | Discharge status: Home / Self Care | Payer: Medicare Other | Source: Ambulatory Visit | Attending: Cardiology | Admitting: Cardiology

## 2015-06-17 ENCOUNTER — Encounter (HOSPITAL_COMMUNITY)
Admission: RE | Disposition: A | Discharge status: Home / Self Care | Payer: Self-pay | Source: Ambulatory Visit | Attending: Cardiology

## 2015-06-17 DIAGNOSIS — Z950 Presence of cardiac pacemaker: Secondary | ICD-10-CM | POA: Diagnosis not present

## 2015-06-17 DIAGNOSIS — I4510 Unspecified right bundle-branch block: Secondary | ICD-10-CM | POA: Insufficient documentation

## 2015-06-17 DIAGNOSIS — N289 Disorder of kidney and ureter, unspecified: Secondary | ICD-10-CM | POA: Insufficient documentation

## 2015-06-17 DIAGNOSIS — I4891 Unspecified atrial fibrillation: Secondary | ICD-10-CM | POA: Diagnosis not present

## 2015-06-17 DIAGNOSIS — Z79899 Other long term (current) drug therapy: Secondary | ICD-10-CM | POA: Insufficient documentation

## 2015-06-17 DIAGNOSIS — I480 Paroxysmal atrial fibrillation: Secondary | ICD-10-CM | POA: Diagnosis not present

## 2015-06-17 DIAGNOSIS — Z8249 Family history of ischemic heart disease and other diseases of the circulatory system: Secondary | ICD-10-CM | POA: Insufficient documentation

## 2015-06-17 DIAGNOSIS — R9431 Abnormal electrocardiogram [ECG] [EKG]: Secondary | ICD-10-CM | POA: Diagnosis not present

## 2015-06-17 DIAGNOSIS — E785 Hyperlipidemia, unspecified: Secondary | ICD-10-CM | POA: Insufficient documentation

## 2015-06-17 DIAGNOSIS — I10 Essential (primary) hypertension: Secondary | ICD-10-CM | POA: Diagnosis not present

## 2015-06-17 HISTORY — PX: CARDIOVERSION: SHX1299

## 2015-06-17 LAB — POCT I-STAT 4, (NA,K, GLUC, HGB,HCT)
Glucose, Bld: 116 mg/dL — ABNORMAL HIGH (ref 65–99)
HCT: 41 % (ref 36.0–46.0)
Hemoglobin: 13.9 g/dL (ref 12.0–15.0)
Potassium: 4.5 mmol/L (ref 3.5–5.1)
Sodium: 138 mmol/L (ref 135–145)

## 2015-06-17 LAB — PROTIME-INR
INR: 2.08 — ABNORMAL HIGH (ref 0.00–1.49)
Prothrombin Time: 23.2 seconds — ABNORMAL HIGH (ref 11.6–15.2)

## 2015-06-17 SURGERY — CARDIOVERSION
Anesthesia: Monitor Anesthesia Care

## 2015-06-17 MED ORDER — PROPOFOL 10 MG/ML IV BOLUS
INTRAVENOUS | Status: DC | PRN
  Administered 2015-06-17: 20 mg via INTRAVENOUS
  Administered 2015-06-17: 40 mg via INTRAVENOUS

## 2015-06-17 MED ORDER — LIDOCAINE HCL (CARDIAC) 20 MG/ML IV SOLN
INTRAVENOUS | Status: DC | PRN
Start: 2015-06-17 — End: 2015-06-17
  Administered 2015-06-17: 40 mg via INTRAVENOUS

## 2015-06-17 MED ORDER — SODIUM CHLORIDE 0.9 % IV SOLN
INTRAVENOUS | Status: DC | PRN
  Administered 2015-06-17: 14:00:00 via INTRAVENOUS

## 2015-06-17 MED ORDER — PHENYLEPHRINE HCL 10 MG/ML IJ SOLN
INTRAMUSCULAR | Status: DC | PRN
  Administered 2015-06-17 (×2): 80 ug via INTRAVENOUS

## 2015-06-17 NOTE — CV Procedure (Signed)
   Cardioversion Note  Anne Macias [id number] June 17, 1923  Procedure: DC Cardioversion Indications: atrial fibrillation  Procedure Details Consent: Obtained Time Out: Verified patient identification, verified procedure, site/side was marked, verified correct patient position, special equipment/implants available, Radiology Safety Procedures followed,  medications/allergies/relevent history reviewed, required imaging and test results available.  Performed  The patient has been on adequate anticoagulation.  The patient received IV Lidocain and propofol administered by anesthesia staff for sedation.  Synchronous cardioversion was performed at 120 joules.  The cardioversion was successful.  Complications: No apparent complications Patient did tolerate procedure well.   Anne Spark, MD, [facility] 06/17/2015, 1:01 PM

## 2015-06-17 NOTE — H&P (View-Only) (Signed)
Cardiology Office Note   Date:  05/29/2015   ID:  Anne Macias, DOB 1922/12/31, MRN [id number]  PCP:  Irven Shelling, MD  Cardiologist:  Virl Axe, MD Chief complaint: palpitations.     History of Present Illness: Anne Macias is a 80 y.o. female who presents for elective cardioversion for symptomatic atrial fibrillation breakthrough on chronic flecainide therapy. Last pacemaker interrogation shows 90% A paced rhythm, 10% AFib burden, virtually zero ventricular pacing (Medtronic Revo 2011) She has undergone cardioversion for symptomatic sustained atrial fibrillation at least twice before, most recently March 2015 and April 2015. Normal LV systolic function, 2+ MR and 2+ TR, mild-moderate LA dilation by echo before last cardioversion. Unfortunately, INR on June 2 was subtherapeutic at 1.6 and not rechecked until INR 2.8 today, usually followed at [facility]   Past Medical History  Diagnosis Date  . Edema of lower extremity   . PAF (paroxysmal atrial fibrillation) 1998  . Hypertension   . Hyperlipidemia   . Visual disturbance   . Tachy-brady syndrome   . Chronic anticoagulation   . S/P cardiac pacemaker procedure Sept 2505    complicated by pocket hematoma  . Tremor 05/08/2014    Past Surgical History  Procedure Laterality Date  . Insert / replace / remove pacemaker  08/28/2010    implantaton  . Cholecystectomy  2005  . Cataract extraction    . US echocardiography  04/10/2010    EF 55-60%  . Cardioversion N/A 02/26/2014    Procedure: CARDIOVERSION;  Surgeon: Deboraha Sprang, MD;  Location: [place];  Service: Cardiovascular;  Laterality: N/A;  . Cardioversion N/A 03/19/2014    Procedure: CARDIOVERSION;  Surgeon: Kina Spark, MD;  Location: [place];  Service: Cardiovascular;  Laterality: N/A;     No current facility-administered medications on file prior to encounter.   Current Outpatient Prescriptions on File Prior to Encounter  Medication Sig  Dispense Refill  . Calcium Carbonate-Vitamin D (CALTRATE 600+D PO) Take 2 tablets by mouth daily.      . flecainide (TAMBOCOR) 50 MG tablet Take 1 tablet (50 mg total) by mouth 2 (two) times daily. 180 tablet 1  . simvastatin (ZOCOR) 40 MG tablet Take 40 mg by mouth every evening.      . furosemide (LASIX) 20 MG tablet Take 1 tablet (20 mg total) by mouth daily. For three days (Patient not taking: Reported on 05/28/2015) 3 tablet 0     Allergies:   Review of patient's allergies indicates no known allergies.    Social History:  The patient  reports that she has never smoked. She does not have any smokeless tobacco history on file. She reports that she does not drink alcohol or use illicit drugs.   Family History:  The patient's family history includes Alcohol abuse in her brother; Heart attack in her brother and mother; Pneumonia in her father.    ROS:  Please see the history of present illness.    Otherwise, review of systems positive for palpitations, tremor, fatigue.   All other systems are reviewed and negative.    PHYSICAL EXAM: VS:  BP 139/107 mmHg  Pulse 100  Resp 18  Ht 5\' 5"  (1.651 m)  Wt 140 lb (63.504 kg)  BMI 23.30 kg/m2  SpO2 97% , BMI Body mass index is 23.3 kg/(m^2).  General: Alert, oriented x3, no distress Head: no evidence of trauma, PERRL, EOMI, no exophtalmos or lid lag, no myxedema, no xanthelasma; normal ears, nose and oropharynx  Neck: normal jugular venous pulsations and no hepatojugular reflux; brisk carotid pulses without delay and no carotid bruits Chest: clear to auscultation, no signs of consolidation by percussion or palpation, normal fremitus, symmetrical and full respiratory excursions Cardiovascular: normal position and quality of the apical impulse, irregular rhythm, normal first and widely split second heart sounds, no murmurs, rubs or gallops Abdomen: no tenderness or distention, no masses by palpation, no abnormal pulsatility or arterial bruits,  normal bowel sounds, no hepatosplenomegaly Extremities: no clubbing, cyanosis or edema; 2+ radial, ulnar and brachial pulses bilaterally; 2+ right femoral, posterior tibial and dorsalis pedis pulses; 2+ left femoral, posterior tibial and dorsalis pedis pulses; no subclavian or femoral bruits Neurological: grossly nonfocal Psych: euthymic mood, full affect   EKG:  EKG is not ordered today. The ekg ordered 6/24 shows atrial fibrillation with RVR and RBBB   Recent Labs: 05/28/2015: BUN 24*; Creatinine, Ser 1.07; Hemoglobin 12.6; Platelets 195.0; Potassium 4.6; Sodium 140     Wt Readings from Last 3 Encounters:  05/29/15 140 lb (63.504 kg)  01/28/15 140 lb 12.8 oz (63.866 kg)  11/07/14 137 lb (62.143 kg)      ASSESSMENT AND PLAN:  Defer planned cardioversion until 3 weeks of confirmed therapeutic anticoagulation.  Mikael Spray, MD  05/29/2015 12:32 PM    Sanda Klein, MD, [facility] [phone] office [phone] pager

## 2015-06-17 NOTE — Transfer of Care (Signed)
Immediate Anesthesia Transfer of Care Note  Patient: Anne Macias  Procedure(s) Performed: Procedure(s): CARDIOVERSION (N/A)  Patient Location: Endoscopy Unit  Anesthesia Type:MAC  Level of Consciousness: awake, oriented and patient cooperative  Airway & Oxygen Therapy: Patient Spontanous Breathing and Patient connected to nasal cannula oxygen  Post-op Assessment: Report given to RN and Post -op Vital signs reviewed and stable  Post vital signs: Reviewed  Last Vitals:  Filed Vitals:   06/17/15 1351  BP:   Pulse:   Temp: 36.5 C  Resp:     Complications: No apparent anesthesia complications

## 2015-06-17 NOTE — Anesthesia Preprocedure Evaluation (Addendum)
Anesthesia Evaluation  Patient identified by MRN, date of birth, ID band Patient awake    Reviewed: Allergy & Precautions, NPO status , Patient's Chart, lab work & pertinent test results  History of Anesthesia Complications Negative for: history of anesthetic complications  Airway Mallampati: II  TM Distance: >3 FB Neck ROM: Full    Dental no notable dental hx. (+) Teeth Intact, Dental Advisory Given   Pulmonary neg pulmonary ROS,  breath sounds clear to auscultation  Pulmonary exam normal       Cardiovascular hypertension, Pt. on medications + dysrhythmias Atrial Fibrillation + pacemaker Rhythm:Irregular Rate:Normal     Neuro/Psych negative neurological ROS  negative psych ROS   GI/Hepatic negative GI ROS, Neg liver ROS,   Endo/Other  negative endocrine ROS  Renal/GU Renal InsufficiencyRenal disease  negative genitourinary   Musculoskeletal negative musculoskeletal ROS (+)   Abdominal   Peds negative pediatric ROS (+)  Hematology negative hematology ROS (+)   Anesthesia Other Findings   Reproductive/Obstetrics negative OB ROS                            Anesthesia Physical Anesthesia Plan  ASA: III  Anesthesia Plan: MAC   Post-op Pain Management:    Induction: Intravenous  Airway Management Planned: Mask  Additional Equipment:   Intra-op Plan:   Post-operative Plan:   Informed Consent: I have reviewed the patients History and Physical, chart, labs and discussed the procedure including the risks, benefits and alternatives for the proposed anesthesia with the patient or authorized representative who has indicated his/her understanding and acceptance.   Dental advisory given  Plan Discussed with: CRNA and Surgeon  Anesthesia Plan Comments:         Anesthesia Quick Evaluation

## 2015-06-17 NOTE — Anesthesia Postprocedure Evaluation (Signed)
  Anesthesia Post-op Note  Patient: Anne Macias  Procedure(s) Performed: Procedure(s): CARDIOVERSION (N/A)  Patient Location: PACU  Anesthesia Type:MAC  Level of Consciousness: awake  Airway and Oxygen Therapy: Patient Spontanous Breathing  Post-op Pain: none  Post-op Assessment: Post-op Vital signs reviewed, Patient's Cardiovascular Status Stable, Respiratory Function Stable, Patent Airway, No signs of Nausea or vomiting and Pain level controlled              Post-op Vital Signs: Reviewed and stable  Last Vitals:  Filed Vitals:   06/17/15 1421  BP: 120/66  Pulse:   Temp:   Resp:     Complications: No apparent anesthesia complications

## 2015-06-17 NOTE — Interval H&P Note (Signed)
History and Physical Interval Note:  06/17/2015 1:01 PM  Anne Macias  has presented today for surgery, with the diagnosis of AFIB  The various methods of treatment have been discussed with the patient and family. After consideration of risks, benefits and other options for treatment, the patient has consented to  Procedure(s): CARDIOVERSION (N/A) as a surgical intervention .  The patient's history has been reviewed, patient examined, no change in status, stable for surgery.  I have reviewed the patient's chart and labs.  Questions were answered to the patient's satisfaction.     Kymber Spark

## 2015-06-17 NOTE — Anesthesia Procedure Notes (Signed)
Procedure Name: MAC Date/Time: 06/17/2015 1:36 PM Performed by: Jenne Campus Pre-anesthesia Checklist: Patient identified, Emergency Drugs available, Suction available, Patient being monitored and Timeout performed Patient Re-evaluated:Patient Re-evaluated prior to inductionOxygen Delivery Method: Nasal cannula and Ambu bag

## 2015-06-17 NOTE — Discharge Instructions (Signed)

## 2015-06-20 ENCOUNTER — Encounter (HOSPITAL_COMMUNITY): Payer: Self-pay | Admitting: Cardiology

## 2015-06-20 DIAGNOSIS — R609 Edema, unspecified: Secondary | ICD-10-CM | POA: Diagnosis not present

## 2015-06-20 DIAGNOSIS — I480 Paroxysmal atrial fibrillation: Secondary | ICD-10-CM | POA: Diagnosis not present

## 2015-06-27 DIAGNOSIS — I5030 Unspecified diastolic (congestive) heart failure: Secondary | ICD-10-CM | POA: Diagnosis not present

## 2015-06-27 DIAGNOSIS — Z5181 Encounter for therapeutic drug level monitoring: Secondary | ICD-10-CM | POA: Diagnosis not present

## 2015-06-27 DIAGNOSIS — I480 Paroxysmal atrial fibrillation: Secondary | ICD-10-CM | POA: Diagnosis not present

## 2015-07-08 DIAGNOSIS — Z7901 Long term (current) use of anticoagulants: Secondary | ICD-10-CM | POA: Diagnosis not present

## 2015-07-15 ENCOUNTER — Other Ambulatory Visit: Payer: Self-pay | Admitting: Internal Medicine

## 2015-07-26 ENCOUNTER — Telehealth: Payer: Self-pay | Admitting: Internal Medicine

## 2015-07-26 ENCOUNTER — Ambulatory Visit (INDEPENDENT_AMBULATORY_CARE_PROVIDER_SITE_OTHER): Payer: Medicare Other | Admitting: *Deleted

## 2015-07-26 DIAGNOSIS — I480 Paroxysmal atrial fibrillation: Secondary | ICD-10-CM | POA: Diagnosis not present

## 2015-07-26 DIAGNOSIS — I495 Sick sinus syndrome: Secondary | ICD-10-CM

## 2015-07-26 DIAGNOSIS — Z950 Presence of cardiac pacemaker: Secondary | ICD-10-CM

## 2015-07-26 LAB — CUP PACEART INCLINIC DEVICE CHECK
Brady Statistic AP VS Percent: 97.56 %
Brady Statistic AS VP Percent: 0.12 %
Brady Statistic RA Percent Paced: 97.63 %
Brady Statistic RV Percent Paced: 0.19 %
Date Time Interrogation Session: 20160826165842
Lead Channel Impedance Value: 512 Ohm
Lead Channel Impedance Value: 600 Ohm
Lead Channel Pacing Threshold Amplitude: 1 V
Lead Channel Pacing Threshold Pulse Width: 0.4 ms
Lead Channel Sensing Intrinsic Amplitude: 10.7 mV
Lead Channel Sensing Intrinsic Amplitude: 2.2 mV
Lead Channel Setting Pacing Amplitude: 2 V
Lead Channel Setting Pacing Amplitude: 2.5 V
Lead Channel Setting Pacing Pulse Width: 0.4 ms
Lead Channel Setting Sensing Sensitivity: 0.9 mV
MDC IDC MSMT BATTERY VOLTAGE: 2.99 V
MDC IDC SET ZONE DETECTION INTERVAL: 400 ms
MDC IDC STAT BRADY AP VP PERCENT: 0.07 %
MDC IDC STAT BRADY AS VS PERCENT: 2.25 %
Zone Setting Detection Interval: 350 ms

## 2015-07-26 MED ORDER — FLECAINIDE ACETATE 100 MG PO TABS: 100.0000 mg | ORAL_TABLET | Freq: Two times a day (BID) | ORAL | Status: DC

## 2015-07-26 NOTE — Progress Notes (Signed)
Post DCCV Pacemaker check in clinic. Pt asymptomatic but concerned. Normal device function. Thresholds, sensing, impedances consistent with previous measurements. Device programmed to maximize longevity. 2.4% mode switch + warfarin. No high ventricular rates noted. Device programmed at appropriate safety margins. Histogram distribution appropriate for patient activity level. Device programmed to optimize intrinsic conduction. Remaining power 2.99V. Tentative ROV w/ SK in 25mo unless scheduled sooner.

## 2015-07-26 NOTE — Telephone Encounter (Signed)
See device check appt.  Pt back in atrial fib. Heart rate around 110.  Pt asymptomatic but concerned.  Reviewed with Dr. Ron Parker and pt should increase flecainide to 100 mg by mouth twice daily.  I spoke with pt and her husband and gave them these instructions. Will send prescription to [facility] on [address].  Will forward to Dr. Caryl Comes to determine when he wants to see pt back in office.

## 2015-07-26 NOTE — Telephone Encounter (Signed)
Spoke with pt who reports this afternoon heart rate is 105-109.  She had cardioversion on July 18,2016 and heart rate has been running in 70's until this afternoon.  She has been walking and feeling good.  Today before going for walk she checked heart rate and it was 109.  BP 110/73. She reports she is feeling fine.   Pt cannot do remote device check.  I spoke with Juanda Crumble in device clinic and device can be checked today at 4:30.  I spoke with pt and she would like to come in for check.  Pt reports she is taking flecainide, Toprol and coumadin as listed.

## 2015-07-26 NOTE — Telephone Encounter (Signed)
New problem    Pt is having high heart rate of 109 and need to speak to nurse. Please call pt.

## 2015-07-28 NOTE — Telephone Encounter (Signed)
Please see soon [facility] thnaks

## 2015-07-29 ENCOUNTER — Telehealth: Payer: Self-pay | Admitting: Internal Medicine

## 2015-07-29 NOTE — Telephone Encounter (Signed)
Called patient, reassured her that her HR is doing well.  Advised that [facility] will be calling her to schedule with Dr Caryl Comes.

## 2015-07-29 NOTE — Telephone Encounter (Signed)
New message      Pt states her HR is now 88.  It was 109 last week. Will she need to be seen?

## 2015-07-31 NOTE — Telephone Encounter (Signed)
Advised to decrease Flecainide back to prior dosing (50 mg BID) and we will see her in office tomorrow. She thanked me for calling back so quickly and agreeable to plan.

## 2015-07-31 NOTE — Telephone Encounter (Signed)
Follow up      Pt c/o medication issue:  1. Name of Medication: flecainide  2. How are you currently taking this medication (dosage and times per day)? Did not have bottle with her  3. Are you having a reaction (difficulty breathing--STAT)? no 4. What is your medication issue? Pt states medication is making her very weak.  Can she skip today's dosage so that she will have strength to come to her appt tomorrow?

## 2015-08-01 ENCOUNTER — Ambulatory Visit (INDEPENDENT_AMBULATORY_CARE_PROVIDER_SITE_OTHER): Payer: Medicare Other | Admitting: Internal Medicine

## 2015-08-01 ENCOUNTER — Other Ambulatory Visit: Payer: Self-pay

## 2015-08-01 VITALS — BP 110/70 | HR 110 | Wt 136.0 lb

## 2015-08-01 DIAGNOSIS — Z45018 Encounter for adjustment and management of other part of cardiac pacemaker: Secondary | ICD-10-CM

## 2015-08-01 DIAGNOSIS — Z950 Presence of cardiac pacemaker: Secondary | ICD-10-CM | POA: Diagnosis not present

## 2015-08-01 DIAGNOSIS — I495 Sick sinus syndrome: Secondary | ICD-10-CM

## 2015-08-01 DIAGNOSIS — I4819 Other persistent atrial fibrillation: Secondary | ICD-10-CM

## 2015-08-01 DIAGNOSIS — I481 Persistent atrial fibrillation: Secondary | ICD-10-CM | POA: Diagnosis not present

## 2015-08-01 LAB — CUP PACEART INCLINIC DEVICE CHECK
Battery Voltage: 2.97 V
Brady Statistic AP VP Percent: 0.84 %
Brady Statistic AP VS Percent: 0.16 %
Brady Statistic AS VP Percent: 9.4 %
Brady Statistic AS VS Percent: 89.6 %
Brady Statistic RV Percent Paced: 10.24 %
Lead Channel Impedance Value: 424 Ohm
Lead Channel Impedance Value: 568 Ohm
Lead Channel Pacing Threshold Pulse Width: 0.4 ms
Lead Channel Sensing Intrinsic Amplitude: 2.0529
Lead Channel Sensing Intrinsic Amplitude: 7.2442
Lead Channel Setting Pacing Amplitude: 2 V
Lead Channel Setting Pacing Amplitude: 2.5 V
Lead Channel Setting Pacing Pulse Width: 0.4 ms
Lead Channel Setting Sensing Sensitivity: 0.9 mV
MDC IDC MSMT LEADCHNL RV PACING THRESHOLD AMPLITUDE: 1 V
MDC IDC SESS DTM: 20160901154444
MDC IDC STAT BRADY RA PERCENT PACED: 1 %
Zone Setting Detection Interval: 350 ms
Zone Setting Detection Interval: 400 ms

## 2015-08-01 MED ORDER — AMIODARONE HCL 200 MG PO TABS: ORAL_TABLET | ORAL | Status: DC

## 2015-08-01 MED ORDER — AMIODARONE HCL 200 MG PO TABS: 200.0000 mg | ORAL_TABLET | Freq: Every day | ORAL | Status: DC

## 2015-08-01 NOTE — Progress Notes (Signed)
Patient Care Team: Lavone Orn, MD as PCP - General (Internal Medicine)   HPI  Anne Macias is a 81 y.o. female Seen in followup for a pacemaker implanted for tachybradycardia syndrome. She has documented atrial fibrillation and is on warfarin.   She underwent cardioversion last week in the context of flecainide which she was tolerating poorly and we done titrated the dose. Begin her on propafenone and undertook cardioversion again. She sustains sinus rhythm.  This failed. She ended up on flecainide. She has not tolerated recent up titration. This was associated with weakness. She's also had recurrent atrial fibrillation.   She has noticed a little bit of tremor  her other major issue has been alopecia. It has resolved largely off of the propafenone notwithstanding maintaining of her metoprolol  Echocardiogram 2011 demonstrated normal LV systolic function    Past Medical History  Diagnosis Date  . Edema of lower extremity   . PAF (paroxysmal atrial fibrillation) 1998  . Hypertension   . Hyperlipidemia   . Visual disturbance   . Tachy-brady syndrome   . Chronic anticoagulation   . S/P cardiac pacemaker procedure Sept 3762    complicated by pocket hematoma  . Tremor 05/08/2014    Past Surgical History  Procedure Laterality Date  . Insert / replace / remove pacemaker  08/28/2010    implantaton  . Cholecystectomy  2005  . Cataract extraction    . US echocardiography  04/10/2010    EF 55-60%  . Cardioversion N/A 02/26/2014    Procedure: CARDIOVERSION;  Surgeon: Deboraha Sprang, MD;  Location: [place];  Service: Cardiovascular;  Laterality: N/A;  . Cardioversion N/A 03/19/2014    Procedure: CARDIOVERSION;  Surgeon: Lovell Spark, MD;  Location: [place];  Service: Cardiovascular;  Laterality: N/A;  . Cardioversion N/A 06/17/2015    Procedure: CARDIOVERSION;  Surgeon: Tamla Spark, MD;  Location[facility];  Service: Cardiovascular;  Laterality: N/A;      Current Outpatient Prescriptions  Medication Sig Dispense Refill  . Calcium Carbonate-Vitamin D (CALTRATE 600+D PO) Take 2 tablets by mouth daily.      . flecainide (TAMBOCOR) 100 MG tablet Take 1 tablet (100 mg total) by mouth 2 (two) times daily. (Patient taking differently: Take 100 mg by mouth. Take 1/2 tablet BID) 60 tablet 3  . furosemide (LASIX) 20 MG tablet Take 1 tablet (20 mg total) by mouth daily. For three days (Patient taking differently: Take 40 mg by mouth daily. ) 3 tablet 0  . Polyethyl Glycol-Propyl Glycol (SYSTANE OP) Place 1 drop into both eyes 2 (two) times daily.    . potassium chloride (K-DUR,KLOR-CON) 10 MEQ tablet Take 10 mEq by mouth as needed.     . simvastatin (ZOCOR) 40 MG tablet Take 40 mg by mouth every evening.      . warfarin (COUMADIN) 2 MG tablet Take 2-3 mg by mouth daily. Take 3mg  by mouth on Monday and Thursday. On all other days take 2 mg once daily.  4  . metoprolol succinate (TOPROL-XL) 100 MG 24 hr tablet Take 100 mg by mouth 2 (two) times daily.      No current facility-administered medications for this visit.    No Known Allergies  Review of Systems negative except from HPI and PMH  Physical Exam BP 110/70 mmHg  Pulse 110  Wt 136 lb (61.689 kg) Well developed and well nourished in no acute distress HENT normal E scleral and icterus clear Neck Supple  JVP flat; carotids brisk and full Clear to ausculation  Regular rate and rhythm, no murmurs gallops or rub Soft with active bowel sounds 1+ Edema Alert and oriented, grossly normal motor and sensory function Skin Warm and Dry   ECG demonstrates atrial pacing with intrinsic conduction  rate is 71 Intervals 20/13/43 Right Bundle branch block  Assessment and  Plan  Atrial fibrillation-persistent  Sinus node dysfunction  Pacemaker-[employer] The patient's device was interrogated.  The information was reviewed. No changes were made in the programming.    Edema  Hypertension  She  continues to struggle with paroxysms now persistent atrial fibrillation giving rise to symptoms of HFpEF. She is volume overloaded. She has significant intolerance of flecainide. She is feeling better having stopped over the last couple of days.  I think our options are few but are not bad. The initial thought was to put her on amiodarone and then try to restore sinus rhythm if she does not revert on her own over the next 10 days or so. We have reviewed side effects.  We reviewed catheter ablation options of  AV ablation, the differences between rate control and rhythm control and the implications of RV apical pacing and device dependence  We noted that 85 % of pts with AV ablation are typically better or much better, some are worse, although this number would be expected to be less in the era of CRT Recurrence of AV conduction is much less than the recurrence of Atrial fibrillation   We will continue her on her current dose of diuretics.

## 2015-08-01 NOTE — Patient Instructions (Addendum)
Medication Instructions:  Your physician has recommended you make the following change in your medication:  1) STOP Flecainide 2) START Amiodarone:  Take 400 mg twice a day for seven days.  Then reduce and take 400 mg daily for seven days.  Then decrease to 200 mg daily.  Labwork: None ordered  Testing/Procedures: None ordered  Follow-Up: Please come in on 9/12 at 8:30 am to device clinic for transmission.  We will determine next step in plan of care after reviewing this transmission.  Your physician recommends that you schedule a follow-up appointment in: 3-4 weeks with Roderic Palau, NP in AFib clinic.  Any Other Special Instructions Will Be Listed Below (If Applicable). Thank you for choosing [facility]!!    Amiodarone tablets What is this medicine? AMIODARONE (a MEE oh da rone) is an antiarrhythmic drug. It helps make your heart beat regularly. Because of the side effects caused by this medicine, it is only used when other medicines have not worked. It is usually used for heartbeat problems that may be life threatening. This medicine may be used for other purposes; ask your health care provider or pharmacist if you have questions. COMMON BRAND NAME(S): Cordarone, Pacerone What should I tell my health care provider before I take this medicine? They need to know if you have any of these conditions: -liver disease -lung disease -other heart problems -thyroid disease -an unusual or allergic reaction to amiodarone, iodine, other medicines, foods, dyes, or preservatives -pregnant or trying to get pregnant -breast-feeding How should I use this medicine? Take this medicine by mouth with a glass of water. Follow the directions on the prescription label. You can take this medicine with or without food. However, you should always take it the same way each time. Take your doses at regular intervals. Do not take your medicine more often than directed. Do not stop taking except  on the advice of your doctor or health care professional. A special MedGuide will be given to you by the pharmacist with each prescription and refill. Be sure to read this information carefully each time. Talk to your pediatrician regarding the use of this medicine in children. Special care may be needed. Overdosage: If you think you have taken too much of this medicine contact a poison control center or emergency room at once. NOTE: This medicine is only for you. Do not share this medicine with others. What if I miss a dose? If you miss a dose, take it as soon as you can. If it is almost time for your next dose, take only that dose. Do not take double or extra doses. What may interact with this medicine? Do not take this medicine with any of the following medications: -abarelix -apomorphine -arsenic trioxide -certain antibiotics like erythromycin, gemifloxacin, levofloxacin, pentamidine -certain medicines for depression like amoxapine, tricyclic antidepressants -certain medicines for fungal infections like fluconazole, itraconazole, ketoconazole, posaconazole, voriconazole -certain medicines for irregular heart beat like disopyramide, dofetilide, dronedarone, ibutilide, propafenone, sotalol -certain medicines for malaria like chloroquine, halofantrine -cisapride -droperidol -haloperidol -hawthorn -maprotiline -methadone -phenothiazines like chlorpromazine, mesoridazine, thioridazine -pimozide -ranolazine -red yeast rice -vardenafil -ziprasidone This medicine may also interact with the following medications: -antiviral medicines for HIV or AIDS -certain medicines for blood pressure, heart disease, irregular heart beat -certain medicines for cholesterol like atorvastatin, cerivastatin, lovastatin, simvastatin -certain medicines for hepatitis C like sofosbuvir and ledipasvir; sofosbuvir -certain medicines for seizures like phenytoin -certain medicines for thyroid problems -certain  medicines that treat or prevent blood clots like  warfarin -cholestyramine -cimetidine -clopidogrel -cyclosporine -dextromethorphan -diuretics -fentanyl -general anesthetics -grapefruit juice -lidocaine -loratadine -methotrexate -other medicines that prolong the QT interval (cause an abnormal heart rhythm) -procainamide -quinidine -rifabutin, rifampin, or rifapentine -St. John's Wort -trazodone This list may not describe all possible interactions. Give your health care provider a list of all the medicines, herbs, non-prescription drugs, or dietary supplements you use. Also tell them if you smoke, drink alcohol, or use illegal drugs. Some items may interact with your medicine. What should I watch for while using this medicine? Your condition will be monitored closely when you first begin therapy. Often, this drug is first started in a hospital or other monitored health care setting. Once you are on maintenance therapy, visit your doctor or health care professional for regular checks on your progress. Because your condition and use of this medicine carry some risk, it is a good idea to carry an identification card, necklace or bracelet with details of your condition, medications, and doctor or health care professional. Dennis Bast may get drowsy or dizzy. Do not drive, use machinery, or do anything that needs mental alertness until you know how this medicine affects you. Do not stand or sit up quickly, especially if you are an older patient. This reduces the risk of dizzy or fainting spells. This medicine can make you more sensitive to the sun. Keep out of the sun. If you cannot avoid being in the sun, wear protective clothing and use sunscreen. Do not use sun lamps or tanning beds/booths. You should have regular eye exams before and during treatment. Call your doctor if you have blurred vision, see halos, or your eyes become sensitive to light. Your eyes may get dry. It may be helpful to use a  lubricating eye solution or artificial tears solution. If you are going to have surgery or a procedure that requires contrast dyes, tell your doctor or health care professional that you are taking this medicine. What side effects may I notice from receiving this medicine? Side effects that you should report to your doctor or health care professional as soon as possible: -allergic reactions like skin rash, itching or hives, swelling of the face, lips, or tongue -blue-gray coloring of the skin -blurred vision, seeing blue green halos, increased sensitivity of the eyes to light -breathing problems -chest pain -dark urine -fast, irregular heartbeat -feeling faint or light-headed -intolerance to heat or cold -nausea or vomiting -pain and swelling of the scrotum -pain, tingling, numbness in feet, hands -redness, blistering, peeling or loosening of the skin, including inside the mouth -spitting up blood -stomach pain -sweating -unusual or uncontrolled movements of body -unusually weak or tired -weight gain or loss -yellowing of the eyes or skin Side effects that usually do not require medical attention (report to your doctor or health care professional if they continue or are bothersome): -change in sex drive or performance -constipation -dizziness -headache -loss of appetite -trouble sleeping This list may not describe all possible side effects. Call your doctor for medical advice about side effects. You may report side effects to FDA at 1-800-FDA-1088. Where should I keep my medicine? Keep out of the reach of children. Store at room temperature between 20 and 25 degrees C (68 and 77 degrees F). Protect from light. Keep container tightly closed. Throw away any unused medicine after the expiration date. NOTE: This sheet is a summary. It may not cover all possible information. If you have questions about this medicine, talk to your doctor, pharmacist, or health care  provider.  2015,  Elsevier/Gold Standard. (2014-02-19 19:48:11)    Trinidad Curet, RN [phone]

## 2015-08-07 DIAGNOSIS — Z7901 Long term (current) use of anticoagulants: Secondary | ICD-10-CM | POA: Diagnosis not present

## 2015-08-12 ENCOUNTER — Ambulatory Visit (INDEPENDENT_AMBULATORY_CARE_PROVIDER_SITE_OTHER): Payer: Medicare Other | Admitting: *Deleted

## 2015-08-12 ENCOUNTER — Encounter: Payer: Self-pay | Admitting: Internal Medicine

## 2015-08-12 DIAGNOSIS — I481 Persistent atrial fibrillation: Secondary | ICD-10-CM | POA: Diagnosis not present

## 2015-08-12 DIAGNOSIS — Z950 Presence of cardiac pacemaker: Secondary | ICD-10-CM | POA: Diagnosis not present

## 2015-08-12 DIAGNOSIS — I4819 Other persistent atrial fibrillation: Secondary | ICD-10-CM

## 2015-08-12 LAB — CUP PACEART INCLINIC DEVICE CHECK
Battery Voltage: 2.99 V
Brady Statistic AP VS Percent: 94.16 %
Brady Statistic AS VP Percent: 0.3 %
Brady Statistic AS VS Percent: 5.34 %
Brady Statistic RA Percent Paced: 94.35 %
Brady Statistic RV Percent Paced: 0.49 %
Lead Channel Impedance Value: 576 Ohm
Lead Channel Pacing Threshold Amplitude: 1 V
Lead Channel Sensing Intrinsic Amplitude: 8.624 mV
Lead Channel Setting Pacing Amplitude: 2 V
Lead Channel Setting Pacing Pulse Width: 0.4 ms
Lead Channel Setting Sensing Sensitivity: 0.9 mV
MDC IDC MSMT LEADCHNL RA IMPEDANCE VALUE: 464 Ohm
MDC IDC MSMT LEADCHNL RA PACING THRESHOLD AMPLITUDE: 0.5 V
MDC IDC MSMT LEADCHNL RA PACING THRESHOLD PULSEWIDTH: 0.4 ms
MDC IDC MSMT LEADCHNL RA SENSING INTR AMPL: 3.3 mV
MDC IDC MSMT LEADCHNL RV PACING THRESHOLD PULSEWIDTH: 0.4 ms
MDC IDC SESS DTM: 20160912084407
MDC IDC SET LEADCHNL RV PACING AMPLITUDE: 2.5 V
MDC IDC STAT BRADY AP VP PERCENT: 0.19 %
Zone Setting Detection Interval: 350 ms
Zone Setting Detection Interval: 400 ms

## 2015-08-12 NOTE — Progress Notes (Signed)
Pacemaker check in clinic. Normal device function. Thresholds, sensing, impedances consistent with previous measurements. Device programmed to maximize longevity. 11 AT/AF episodes (3% +warfarin)- longest 13 hrs (all took place 9/1 and 9/2). No high ventricular rates noted. Device programmed at appropriate safety margins. Histogram distribution appropriate for patient activity level. Device programmed to optimize intrinsic conduction. Battery @ 2.99V (RRT=2.81V). ROV with AF clinic 08/22/15.

## 2015-08-13 ENCOUNTER — Telehealth: Payer: Self-pay | Admitting: *Deleted

## 2015-08-13 NOTE — Telephone Encounter (Signed)
Called patient to help with this situation.  She informs me that this was fixed last night after they spoke with insurance company.  Rx is ready for patient to pick up.  She thanks me for helping and following up with this.

## 2015-08-13 NOTE — Telephone Encounter (Signed)
Patient called for a refill on amiodarone. Per chart, patient has refills. I called the pharmacy and was informed that it is to soon per insurance for this to be refilled and that she should have a few days supply left. The pharmacist also stated that the patients husband came into the pharmacy yesterday afternoon and cursed at her because he was informed that he would need to pay out of pocket for a few days worth of the medication. He then became more upset and stated that she would just have to be without the medication as he was not going to pay out of pocket for this.

## 2015-08-15 ENCOUNTER — Encounter: Payer: Self-pay | Admitting: Internal Medicine

## 2015-08-15 ENCOUNTER — Telehealth: Payer: Self-pay | Admitting: Internal Medicine

## 2015-08-15 NOTE — Telephone Encounter (Signed)
New message    Pt c/o medication issue:  1. Name of Medication: amiodarone  2. How are you currently taking this medication (dosage and times per day)? Was taking 400mg  a day for a week; starts taking 200mg  tomorrow   3. Are you having a reaction (difficulty breathing--STAT)? No  4. What is your medication issue? Pt is seeing low kidney function at night

## 2015-08-15 NOTE — Telephone Encounter (Signed)
Called patient about her call to our office. Patient complaining about urine output at night and stated she just has a trickle coming out when she voids. Patient states she has no problems during the day. Patient wanted to know if there was a correlation with taking Amiodarone. Informed patient that I do not think her issue with her bladder has any correlation with Amiodarone. Encouraged patient to follow up with her PCP for urine retention to make sure her bladder is emptying. Patient verbalized understanding.

## 2015-08-22 ENCOUNTER — Encounter: Payer: Self-pay | Admitting: Internal Medicine

## 2015-08-22 ENCOUNTER — Encounter (HOSPITAL_COMMUNITY): Payer: Self-pay | Admitting: Nurse Practitioner

## 2015-08-22 ENCOUNTER — Ambulatory Visit (HOSPITAL_COMMUNITY)
Admission: RE | Admit: 2015-08-22 | Discharge: 2015-08-22 | Disposition: A | Discharge status: Home / Self Care | Payer: Medicare Other | Source: Ambulatory Visit | Attending: Nurse Practitioner | Admitting: Nurse Practitioner

## 2015-08-22 VITALS — BP 120/70 | HR 74 | Ht 65.0 in | Wt 130.4 lb

## 2015-08-22 DIAGNOSIS — I4819 Other persistent atrial fibrillation: Secondary | ICD-10-CM

## 2015-08-22 DIAGNOSIS — I481 Persistent atrial fibrillation: Secondary | ICD-10-CM

## 2015-08-22 LAB — HEPATIC FUNCTION PANEL
ALT: 21 U/L (ref 14–54)
AST: 21 U/L (ref 15–41)
Albumin: 2.9 g/dL — ABNORMAL LOW (ref 3.5–5.0)
Alkaline Phosphatase: 51 U/L (ref 38–126)
BILIRUBIN DIRECT: 0.1 mg/dL (ref 0.1–0.5)
BILIRUBIN INDIRECT: 0.4 mg/dL (ref 0.3–0.9)
Total Bilirubin: 0.5 mg/dL (ref 0.3–1.2)
Total Protein: 6.3 g/dL — ABNORMAL LOW (ref 6.5–8.1)

## 2015-08-22 LAB — TSH: TSH: 1.358 u[IU]/mL (ref 0.350–4.500)

## 2015-08-22 NOTE — Patient Instructions (Signed)
Parking code 0900 for October

## 2015-08-22 NOTE — Progress Notes (Signed)
Patient ID: Anne Macias, female   DOB: 1923/10/16, 81 y.o.   MRN: [id number]     Primary Care Physician: Irven Shelling, MD Referring Physician: Dr. Manon Hilding is a 81 y.o. female that is being evaluated in the afib clinic today for persistent afib. She is a pt of Dr. Caryl Comes with a PPM for tachy/brady syndrome. She is on warfarin for afib. She recently had flecainide and DCCV with ERAF.  Flecainide was washed out and she was then loaded on amiodarone and is now on 200 mg a day. She has been in rhythm since starting amiodarone. She is feeling much better. No side effects from amiodarone that she has noticed.  Today, she denies symptoms of palpitations, chest pain, shortness of breath, orthopnea, PND, lower extremity edema, dizziness, presyncope, syncope, or neurologic sequela. The patient is tolerating medications without difficulties and is otherwise without complaint today.   Past Medical History  Diagnosis Date  . Edema of lower extremity   . PAF (paroxysmal atrial fibrillation) 1998  . Hypertension   . Hyperlipidemia   . Visual disturbance   . Tachy-brady syndrome   . Chronic anticoagulation   . S/P cardiac pacemaker procedure Sept 7867    complicated by pocket hematoma  . Tremor 05/08/2014   Past Surgical History  Procedure Laterality Date  . Insert / replace / remove pacemaker  08/28/2010    implantaton  . Cholecystectomy  2005  . Cataract extraction    . US echocardiography  04/10/2010    EF 55-60%  . Cardioversion N/A 02/26/2014    Procedure: CARDIOVERSION;  Surgeon: Deboraha Sprang, MD;  Location: [place];  Service: Cardiovascular;  Laterality: N/A;  . Cardioversion N/A 03/19/2014    Procedure: CARDIOVERSION;  Surgeon: Norinne Spark, MD;  Location: [place];  Service: Cardiovascular;  Laterality: N/A;  . Cardioversion N/A 06/17/2015    Procedure: CARDIOVERSION;  Surgeon: Marri Spark, MD;  Location: [facility] ENDOSCOPY;  Service: Cardiovascular;   Laterality: N/A;    Current Outpatient Prescriptions  Medication Sig Dispense Refill  . amiodarone (PACERONE) 200 MG tablet Take 1 tablet (200 mg total) by mouth daily. 30 tablet 3  . Calcium Carbonate-Vitamin D (CALTRATE 600+D PO) Take 2 tablets by mouth daily.      . furosemide (LASIX) 20 MG tablet Take 1 tablet (20 mg total) by mouth daily. For three days (Patient taking differently: Take 40 mg by mouth daily. Pt is taking PRN,) 3 tablet 0  . metoprolol succinate (TOPROL-XL) 100 MG 24 hr tablet Take 100 mg by mouth 2 (two) times daily.     Vladimir Faster Glycol-Propyl Glycol (SYSTANE OP) Place 1 drop into both eyes 2 (two) times daily.    . potassium chloride (K-DUR,KLOR-CON) 10 MEQ tablet Take 10 mEq by mouth as needed.     . simvastatin (ZOCOR) 40 MG tablet Take 40 mg by mouth every evening.      . warfarin (COUMADIN) 2 MG tablet Take 2-3 mg by mouth daily. Take 3mg  by mouth on Monday and Thursday. On all other days take 2 mg once daily.  4   No current facility-administered medications for this encounter.    No Known Allergies  Social History   Social History  . Marital Status: Married    Spouse Name: N/A  . Number of Children: N/A  . Years of Education: N/A   Occupational History  . Not on file.   Social History Main Topics  . Smoking  status: Never Smoker   . Smokeless tobacco: Not on file  . Alcohol Use: No  . Drug Use: No  . Sexual Activity: Not on file   Other Topics Concern  . Not on file   Social History Narrative    Family History  Problem Relation Age of Onset  . Heart attack Mother   . Pneumonia Father   . Heart attack Brother   . Alcohol abuse Brother     ROS- All systems are reviewed and negative except as per the HPI above  Physical Exam: Filed Vitals:   08/22/15 1459  BP: 120/70  Pulse: 74  Height: 5\' 5"  (1.651 m)  Weight: 130 lb 6.4 oz (59.149 kg)    GEN- The patient is well appearing, alert and oriented x 3 today.   Head- normocephalic,  atraumatic Eyes-  Sclera clear, conjunctiva pink Ears- hearing intact Oropharynx- clear Neck- supple, no JVP Lymph- no cervical lymphadenopathy Lungs- Clear to ausculation bilaterally, normal work of breathing Heart- Regular rate and rhythm, no murmurs, rubs or gallops, PMI not laterally displaced GI- soft, NT, ND, + BS Extremities- no clubbing, cyanosis, or edema MS- no significant deformity or atrophy Skin- no rash or lesion Psych- euthymic mood, full affect Neuro- strength and sensation are intact  EKG- Atrial paced rhythm with prolonged AV conduction.RBBB, PR int 246 ms, QRS int 128 ms, QTc 457 ms.  Assessment and Plan: 1. Persistent afib Seems to be maintaining SR on amiodarone Will continue 200 mg dose Continue metoprolol Continue warfarin Baseline liver/tsh today  F/u in one month in afib clinic Will have device interrogated at that time to see afib burden

## 2015-09-04 DIAGNOSIS — Z7901 Long term (current) use of anticoagulants: Secondary | ICD-10-CM | POA: Diagnosis not present

## 2015-09-04 DIAGNOSIS — Z23 Encounter for immunization: Secondary | ICD-10-CM | POA: Diagnosis not present

## 2015-09-11 DIAGNOSIS — Z7901 Long term (current) use of anticoagulants: Secondary | ICD-10-CM | POA: Diagnosis not present

## 2015-09-18 ENCOUNTER — Ambulatory Visit (HOSPITAL_COMMUNITY)
Admission: RE | Admit: 2015-09-18 | Discharge: 2015-09-18 | Disposition: A | Discharge status: Home / Self Care | Payer: Medicare Other | Source: Ambulatory Visit | Attending: Nurse Practitioner | Admitting: Nurse Practitioner

## 2015-09-18 ENCOUNTER — Encounter (HOSPITAL_COMMUNITY): Payer: Self-pay | Admitting: Nurse Practitioner

## 2015-09-18 VITALS — BP 124/76 | HR 72 | Ht 65.0 in | Wt 132.0 lb

## 2015-09-18 DIAGNOSIS — Z950 Presence of cardiac pacemaker: Secondary | ICD-10-CM | POA: Insufficient documentation

## 2015-09-18 DIAGNOSIS — I481 Persistent atrial fibrillation: Secondary | ICD-10-CM

## 2015-09-18 DIAGNOSIS — I4819 Other persistent atrial fibrillation: Secondary | ICD-10-CM

## 2015-09-18 NOTE — Progress Notes (Signed)
Patient ID: Anne Macias, female   DOB: [birth year], 81 y.o.   MRN: [id number]     Primary Care Physician: Irven Shelling, MD Referring Physician: Dr. Manon Hilding is a 81 y.o. female with a h/o persistent  afib that was started on amiodarone 9/1 and has been maintaining SR. Interrogation of her device shows no afib since started on amiodarone. She is feeling well, no bleeding issues on warfarin.  Today, she denies symptoms of palpitations, chest pain, shortness of breath, orthopnea, PND, lower extremity edema, dizziness, presyncope, syncope, or neurologic sequela. The patient is tolerating medications without difficulties and is otherwise without complaint today.   Past Medical History  Diagnosis Date  . Edema of lower extremity   . PAF (paroxysmal atrial fibrillation) ([place]) 1998  . Hypertension   . Hyperlipidemia   . Visual disturbance   . Tachy-brady syndrome ([place])   . Chronic anticoagulation   . S/P cardiac pacemaker procedure Sept 7408    complicated by pocket hematoma  . Tremor 05/08/2014   Past Surgical History  Procedure Laterality Date  . Insert / replace / remove pacemaker  08/28/2010    implantaton  . Cholecystectomy  2005  . Cataract extraction    . US echocardiography  04/10/2010    EF 55-60%  . Cardioversion N/A 02/26/2014    Procedure: CARDIOVERSION;  Surgeon: Deboraha Sprang, MD;  Location: [place];  Service: Cardiovascular;  Laterality: N/A;  . Cardioversion N/A 03/19/2014    Procedure: CARDIOVERSION;  Surgeon: Jaelah Spark, MD;  Location: [place];  Service: Cardiovascular;  Laterality: N/A;  . Cardioversion N/A 06/17/2015    Procedure: CARDIOVERSION;  Surgeon: Davina Spark, MD;  Location: [facility] ENDOSCOPY;  Service: Cardiovascular;  Laterality: N/A;    Current Outpatient Prescriptions  Medication Sig Dispense Refill  . amiodarone (PACERONE) 200 MG tablet Take 1 tablet (200 mg total) by mouth daily. 30 tablet 3  . Calcium  Carbonate-Vitamin D (CALTRATE 600+D PO) Take 2 tablets by mouth daily.      . furosemide (LASIX) 20 MG tablet Take 1 tablet (20 mg total) by mouth daily. For three days (Patient taking differently: Take 40 mg by mouth daily. Pt is taking PRN,) 3 tablet 0  . metoprolol succinate (TOPROL-XL) 100 MG 24 hr tablet Take 100 mg by mouth 2 (two) times daily.     Vladimir Faster Glycol-Propyl Glycol (SYSTANE OP) Place 1 drop into both eyes 2 (two) times daily.    . potassium chloride (K-DUR,KLOR-CON) 10 MEQ tablet Take 10 mEq by mouth as needed.     . simvastatin (ZOCOR) 40 MG tablet Take 40 mg by mouth every evening.      . warfarin (COUMADIN) 2 MG tablet Take 2-3 mg by mouth daily. Take 3mg  by mouth on Monday and Thursday. On all other days take 2 mg once daily.  4   No current facility-administered medications for this encounter.    No Known Allergies  Social History   Social History  . Marital Status: Married    Spouse Name: N/A  . Number of Children: N/A  . Years of Education: N/A   Occupational History  . Not on file.   Social History Main Topics  . Smoking status: Never Smoker   . Smokeless tobacco: Not on file  . Alcohol Use: No  . Drug Use: No  . Sexual Activity: Not on file   Other Topics Concern  . Not on file   Social History  Narrative    Family History  Problem Relation Age of Onset  . Heart attack Mother   . Pneumonia Father   . Heart attack Brother   . Alcohol abuse Brother     ROS- All systems are reviewed and negative except as per the HPI above  Physical Exam: Filed Vitals:   09/18/15 1446  BP: 124/76  Pulse: 72  Height: 5\' 5"  (1.651 m)  Weight: 132 lb (59.875 kg)    GEN- The patient is well appearing, alert and oriented x 3 today.   Head- normocephalic, atraumatic Eyes-  Sclera clear, conjunctiva pink Ears- hearing intact Oropharynx- clear Neck- supple, no JVP Lymph- no cervical lymphadenopathy Lungs- Clear to ausculation bilaterally, normal work  of breathing Heart- Regular rate and rhythm, no murmurs, rubs or gallops, PMI not laterally displaced GI- soft, NT, ND, + BS Extremities- no clubbing, cyanosis, or edema MS- no significant deformity or atrophy Skin- no rash or lesion Psych- euthymic mood, full affect Neuro- strength and sensation are intact  EKG- atrial paced rhythm with prolonged AV conduction, RBBB, pr int 246 ms, qrs int 116 ms, qtc 453 ms Interrogation of device  For afib burden only shows No afib  Assessment and Plan: 1. Persistent afib In SR Continue amiodarone TSH/liver checked last part of September and ok. Continue warfarin  2. PPM F/u in the pacer clinic as scheduled    F/u afib clinic in 3 months   [place]. Ash[place] Grundman, [facility] [address] [place], [place] [address] [phone]

## 2015-09-20 DIAGNOSIS — L080 Pyoderma: Secondary | ICD-10-CM | POA: Diagnosis not present

## 2015-10-02 DIAGNOSIS — Z7901 Long term (current) use of anticoagulants: Secondary | ICD-10-CM | POA: Diagnosis not present

## 2015-10-08 ENCOUNTER — Telehealth: Payer: Self-pay | Admitting: Internal Medicine

## 2015-10-08 ENCOUNTER — Other Ambulatory Visit: Payer: Self-pay | Admitting: *Deleted

## 2015-10-08 MED ORDER — AMIODARONE HCL 200 MG PO TABS: 200.0000 mg | ORAL_TABLET | Freq: Every day | ORAL | Status: DC

## 2015-10-08 NOTE — Telephone Encounter (Signed)
New message       *STAT* If patient is at the pharmacy, call can be transferred to refill team.   1. Which medications need to be refilled? (please list name of each medication and dose if known) amiodarone 200mg   2. Which pharmacy/location (including street and city if local pharmacy) is medication to be sent to? [facility] 3. Do they need a 30 day or 90 day supply? [place]

## 2015-10-08 NOTE — Telephone Encounter (Signed)
Amiodarone 200 mg has been Sent to [facility] [address].

## 2015-10-14 ENCOUNTER — Other Ambulatory Visit: Payer: Self-pay | Admitting: Internal Medicine

## 2015-10-30 DIAGNOSIS — Z7901 Long term (current) use of anticoagulants: Secondary | ICD-10-CM | POA: Diagnosis not present

## 2015-11-07 DIAGNOSIS — L821 Other seborrheic keratosis: Secondary | ICD-10-CM | POA: Diagnosis not present

## 2015-11-07 DIAGNOSIS — L812 Freckles: Secondary | ICD-10-CM | POA: Diagnosis not present

## 2015-11-13 DIAGNOSIS — I10 Essential (primary) hypertension: Secondary | ICD-10-CM | POA: Diagnosis not present

## 2015-11-13 DIAGNOSIS — I480 Paroxysmal atrial fibrillation: Secondary | ICD-10-CM | POA: Diagnosis not present

## 2015-11-27 DIAGNOSIS — Z7901 Long term (current) use of anticoagulants: Secondary | ICD-10-CM | POA: Diagnosis not present

## 2015-12-24 ENCOUNTER — Ambulatory Visit (HOSPITAL_COMMUNITY)
Admission: RE | Admit: 2015-12-24 | Discharge: 2015-12-24 | Disposition: A | Discharge status: Home / Self Care | Payer: Medicare Other | Source: Ambulatory Visit | Attending: Nurse Practitioner | Admitting: Nurse Practitioner

## 2015-12-24 VITALS — BP 118/66 | HR 70 | Ht 66.0 in | Wt 136.8 lb

## 2015-12-24 DIAGNOSIS — I495 Sick sinus syndrome: Secondary | ICD-10-CM | POA: Diagnosis not present

## 2015-12-24 DIAGNOSIS — I10 Essential (primary) hypertension: Secondary | ICD-10-CM | POA: Diagnosis not present

## 2015-12-24 DIAGNOSIS — Z950 Presence of cardiac pacemaker: Secondary | ICD-10-CM | POA: Insufficient documentation

## 2015-12-24 DIAGNOSIS — Z8249 Family history of ischemic heart disease and other diseases of the circulatory system: Secondary | ICD-10-CM | POA: Insufficient documentation

## 2015-12-24 DIAGNOSIS — E785 Hyperlipidemia, unspecified: Secondary | ICD-10-CM | POA: Insufficient documentation

## 2015-12-24 DIAGNOSIS — Z79899 Other long term (current) drug therapy: Secondary | ICD-10-CM | POA: Diagnosis not present

## 2015-12-24 DIAGNOSIS — Z7901 Long term (current) use of anticoagulants: Secondary | ICD-10-CM | POA: Diagnosis not present

## 2015-12-24 DIAGNOSIS — I481 Persistent atrial fibrillation: Secondary | ICD-10-CM | POA: Diagnosis present

## 2015-12-24 DIAGNOSIS — I4819 Other persistent atrial fibrillation: Secondary | ICD-10-CM

## 2015-12-24 LAB — HEPATIC FUNCTION PANEL
ALBUMIN: 2.6 g/dL — AB (ref 3.5–5.0)
ALT: 30 U/L (ref 14–54)
AST: 30 U/L (ref 15–41)
Alkaline Phosphatase: 59 U/L (ref 38–126)
BILIRUBIN TOTAL: 0.6 mg/dL (ref 0.3–1.2)
Bilirubin, Direct: 0.1 mg/dL (ref 0.1–0.5)
Indirect Bilirubin: 0.5 mg/dL (ref 0.3–0.9)
TOTAL PROTEIN: 6 g/dL — AB (ref 6.5–8.1)

## 2015-12-24 LAB — TSH: TSH: 0.961 u[IU]/mL (ref 0.350–4.500)

## 2015-12-25 ENCOUNTER — Encounter (HOSPITAL_COMMUNITY): Payer: Self-pay | Admitting: Nurse Practitioner

## 2015-12-25 NOTE — Progress Notes (Signed)
Patient ID: Anne Macias, female   DOB: Mar 31, 1923, 82 y.o.   MRN: [id number]     Primary Care Physician: Irven Shelling, MD Referring Physician: Dr. Manon Hilding is a 82 y.o. female with a h/o persistent  afib that was started on amiodarone 9/1 and has been maintaining SR.She is feeling well, no bleeding issues on warfarin. Has not noticed any irregular heart beat.  Today, she denies symptoms of palpitations, chest pain, shortness of breath, orthopnea, PND, lower extremity edema, dizziness, presyncope, syncope, or neurologic sequela. The patient is tolerating medications without difficulties and is otherwise without complaint today.   Past Medical History  Diagnosis Date  . Edema of lower extremity   . PAF (paroxysmal atrial fibrillation) ([place]) 1998  . Hypertension   . Hyperlipidemia   . Visual disturbance   . Tachy-brady syndrome ([place])   . Chronic anticoagulation   . S/P cardiac pacemaker procedure Sept [id number]    complicated by pocket hematoma  . Tremor 05/08/2014   Past Surgical History  Procedure Laterality Date  . Insert / replace / remove pacemaker  08/28/2010    implantaton  . Cholecystectomy  2005  . Cataract extraction    . US echocardiography  04/10/2010    EF 55-60%  . Cardioversion N/A 02/26/2014    Procedure: CARDIOVERSION;  Surgeon: Deboraha Sprang, MD;  Location: [place];  Service: Cardiovascular;  Laterality: N/A;  . Cardioversion N/A 03/19/2014    Procedure: CARDIOVERSION;  Surgeon: Kyrstal Spark, MD;  Location: [place];  Service: Cardiovascular;  Laterality: N/A;  . Cardioversion N/A 06/17/2015    Procedure: CARDIOVERSION;  Surgeon: Jennica Spark, MD;  Location: [facility] ENDOSCOPY;  Service: Cardiovascular;  Laterality: N/A;    Current Outpatient Prescriptions  Medication Sig Dispense Refill  . amiodarone (PACERONE) 200 MG tablet Take 1 tablet (200 mg total) by mouth daily. 90 tablet 3  . Calcium Carbonate-Vitamin D (CALTRATE 600+D  PO) Take 2 tablets by mouth daily.      . furosemide (LASIX) 20 MG tablet Take 1 tablet (20 mg total) by mouth daily. For three days (Patient taking differently: Take 40 mg by mouth daily. Pt is taking PRN,) 3 tablet 0  . metoprolol succinate (TOPROL-XL) 100 MG 24 hr tablet Take 100 mg by mouth 2 (two) times daily.     . metoprolol succinate (TOPROL-XL) 100 MG 24 hr tablet Take 1 tablet (100 mg total) by mouth 2 (two) times daily. 180 tablet 2  . Polyethyl Glycol-Propyl Glycol (SYSTANE OP) Place 1 drop into both eyes 2 (two) times daily.    . potassium chloride (K-DUR,KLOR-CON) 10 MEQ tablet Take 10 mEq by mouth as needed.     . simvastatin (ZOCOR) 40 MG tablet Take 40 mg by mouth every evening.      . warfarin (COUMADIN) 2 MG tablet Take 2-3 mg by mouth daily. Take 3mg  by mouth on Monday and Thursday. On all other days take 2 mg once daily.  4   No current facility-administered medications for this encounter.    No Known Allergies  Social History   Social History  . Marital Status: Married    Spouse Name: N/A  . Number of Children: N/A  . Years of Education: N/A   Occupational History  . Not on file.   Social History Main Topics  . Smoking status: Never Smoker   . Smokeless tobacco: Not on file  . Alcohol Use: No  . Drug Use: No  .  Sexual Activity: Not on file   Other Topics Concern  . Not on file   Social History Narrative    Family History  Problem Relation Age of Onset  . Heart attack Mother   . Pneumonia Father   . Heart attack Brother   . Alcohol abuse Brother     ROS- All systems are reviewed and negative except as per the HPI above  Physical Exam: Filed Vitals:   12/24/15 1435  BP: 118/66  Pulse: 70  Height: 5\' 6"  (1.676 m)  Weight: 136 lb 12.8 oz (62.052 kg)    GEN- The patient is well appearing, alert and oriented x 3 today.   Head- normocephalic, atraumatic Eyes-  Sclera clear, conjunctiva pink Ears- hearing intact Oropharynx- clear Neck-  supple, no JVP Lymph- no cervical lymphadenopathy Lungs- Clear to ausculation bilaterally, normal work of breathing Heart- Regular rate and rhythm, no murmurs, rubs or gallops, PMI not laterally displaced GI- soft, NT, ND, + BS Extremities- no clubbing, cyanosis, or edema MS- no significant deformity or atrophy Skin- no rash or lesion Psych- euthymic mood, full affect Neuro- strength and sensation are intact  EKG- atrial paced rhythm with prolonged AV conduction, RBBB, pr int 256, qrs int 120 ms, qtc 479 ms  Assessment and Plan: 1. Persistent afib In SR Continue amiodarone TSH/liver today Continue warfarin  2. PPM F/u in the pacer clinic as scheduled on recall in March   F/u afib clinic in 4 months   [place]. [place], [facility] [address] [place], [place] [address] [phone]

## 2016-02-19 ENCOUNTER — Encounter: Payer: Self-pay | Admitting: Internal Medicine

## 2016-02-19 ENCOUNTER — Ambulatory Visit (INDEPENDENT_AMBULATORY_CARE_PROVIDER_SITE_OTHER): Payer: Medicare Other | Admitting: *Deleted

## 2016-02-19 DIAGNOSIS — I495 Sick sinus syndrome: Secondary | ICD-10-CM | POA: Diagnosis not present

## 2016-02-19 LAB — CUP PACEART INCLINIC DEVICE CHECK
Battery Voltage: 2.97 V
Brady Statistic AP VP Percent: 0 %
Brady Statistic AP VS Percent: 99.96 %
Brady Statistic AS VP Percent: 0 %
Brady Statistic RV Percent Paced: 0 %
Date Time Interrogation Session: 20170322154647
Implantable Lead Implant Date: 20110929
Implantable Lead Implant Date: 20110929
Implantable Lead Location: 753859
Implantable Lead Model: 5086
Lead Channel Pacing Threshold Amplitude: 1 V
Lead Channel Pacing Threshold Pulse Width: 0.4 ms
Lead Channel Pacing Threshold Pulse Width: 0.4 ms
Lead Channel Sensing Intrinsic Amplitude: 10.349 mV
Lead Channel Setting Pacing Amplitude: 2 V
Lead Channel Setting Pacing Amplitude: 2.5 V
Lead Channel Setting Pacing Pulse Width: 0.4 ms
Lead Channel Setting Sensing Sensitivity: 0.9 mV
MDC IDC LEAD LOCATION: 753860
MDC IDC LEAD MODEL: 5086
MDC IDC MSMT LEADCHNL RA IMPEDANCE VALUE: 448 Ohm
MDC IDC MSMT LEADCHNL RA PACING THRESHOLD AMPLITUDE: 0.5 V
MDC IDC MSMT LEADCHNL RA SENSING INTR AMPL: 2.096 mV
MDC IDC MSMT LEADCHNL RV IMPEDANCE VALUE: 576 Ohm
MDC IDC STAT BRADY AS VS PERCENT: 0.03 %
MDC IDC STAT BRADY RA PERCENT PACED: 99.97 %

## 2016-02-19 NOTE — Progress Notes (Signed)
Pacemaker check in clinic. Normal device function. Thresholds, sensing, impedances consistent with previous measurements. Device programmed to maximize longevity. No mode switch or high ventricular rates noted. Device programmed at appropriate safety margins. Histogram distribution appropriate for patient activity level. Device programmed to optimize intrinsic conduction. Batt voltage 2.97V (ERI 2.81V). Patient will follow up with [facility] in 6 months.

## 2016-06-05 ENCOUNTER — Encounter (HOSPITAL_COMMUNITY): Payer: Self-pay | Admitting: Cardiovascular Disease

## 2016-07-07 ENCOUNTER — Other Ambulatory Visit: Payer: Self-pay | Admitting: Sports Medicine

## 2016-07-07 DIAGNOSIS — S32020A Wedge compression fracture of second lumbar vertebra, initial encounter for closed fracture: Secondary | ICD-10-CM

## 2016-07-13 ENCOUNTER — Other Ambulatory Visit: Payer: Self-pay | Admitting: Internal Medicine

## 2016-07-14 ENCOUNTER — Encounter (HOSPITAL_COMMUNITY): Payer: Self-pay | Admitting: Emergency Medicine

## 2016-07-14 ENCOUNTER — Observation Stay (HOSPITAL_COMMUNITY)
Admission: EM | Admit: 2016-07-14 | Discharge: 2016-07-16 | Disposition: A | Discharge status: Home / Self Care | Payer: Medicare Other | Attending: Internal Medicine | Admitting: Internal Medicine

## 2016-07-14 ENCOUNTER — Emergency Department (HOSPITAL_COMMUNITY): Payer: Medicare Other

## 2016-07-14 DIAGNOSIS — N12 Tubulo-interstitial nephritis, not specified as acute or chronic: Principal | ICD-10-CM | POA: Insufficient documentation

## 2016-07-14 DIAGNOSIS — T148 Other injury of unspecified body region: Secondary | ICD-10-CM | POA: Diagnosis not present

## 2016-07-14 DIAGNOSIS — S32020G Wedge compression fracture of second lumbar vertebra, subsequent encounter for fracture with delayed healing: Secondary | ICD-10-CM

## 2016-07-14 DIAGNOSIS — R339 Retention of urine, unspecified: Secondary | ICD-10-CM | POA: Diagnosis present

## 2016-07-14 DIAGNOSIS — Z79899 Other long term (current) drug therapy: Secondary | ICD-10-CM | POA: Insufficient documentation

## 2016-07-14 DIAGNOSIS — Z7901 Long term (current) use of anticoagulants: Secondary | ICD-10-CM | POA: Insufficient documentation

## 2016-07-14 DIAGNOSIS — Z5181 Encounter for therapeutic drug level monitoring: Secondary | ICD-10-CM | POA: Diagnosis not present

## 2016-07-14 DIAGNOSIS — I10 Essential (primary) hypertension: Secondary | ICD-10-CM | POA: Diagnosis not present

## 2016-07-14 DIAGNOSIS — N390 Urinary tract infection, site not specified: Secondary | ICD-10-CM | POA: Diagnosis present

## 2016-07-14 DIAGNOSIS — M549 Dorsalgia, unspecified: Secondary | ICD-10-CM

## 2016-07-14 DIAGNOSIS — I4819 Other persistent atrial fibrillation: Secondary | ICD-10-CM | POA: Diagnosis present

## 2016-07-14 LAB — CBC WITH DIFFERENTIAL/PLATELET
BASOS ABS: 0 10*3/uL (ref 0.0–0.1)
BASOS PCT: 0 %
Eosinophils Absolute: 0.1 10*3/uL (ref 0.0–0.7)
Eosinophils Relative: 1 %
HEMATOCRIT: 41.1 % (ref 36.0–46.0)
Hemoglobin: 13.1 g/dL (ref 12.0–15.0)
LYMPHS PCT: 24 %
Lymphs Abs: 1.6 10*3/uL (ref 0.7–4.0)
MCH: 27.3 pg (ref 26.0–34.0)
MCHC: 31.9 g/dL (ref 30.0–36.0)
MCV: 85.6 fL (ref 78.0–100.0)
MONO ABS: 0.6 10*3/uL (ref 0.1–1.0)
Monocytes Relative: 9 %
NEUTROS ABS: 4.3 10*3/uL (ref 1.7–7.7)
Neutrophils Relative %: 66 %
PLATELETS: 222 10*3/uL (ref 150–400)
RBC: 4.8 MIL/uL (ref 3.87–5.11)
RDW: 16.3 % — AB (ref 11.5–15.5)
WBC: 6.6 10*3/uL (ref 4.0–10.5)

## 2016-07-14 LAB — URINALYSIS, ROUTINE W REFLEX MICROSCOPIC
Bilirubin Urine: NEGATIVE
Glucose, UA: NEGATIVE mg/dL
Ketones, ur: NEGATIVE mg/dL
Nitrite: POSITIVE — AB
PROTEIN: NEGATIVE mg/dL
SPECIFIC GRAVITY, URINE: 1.025 (ref 1.005–1.030)
pH: 6 (ref 5.0–8.0)

## 2016-07-14 LAB — BASIC METABOLIC PANEL
ANION GAP: 7 (ref 5–15)
BUN: 19 mg/dL (ref 6–20)
CALCIUM: 8 mg/dL — AB (ref 8.9–10.3)
CO2: 26 mmol/L (ref 22–32)
Chloride: 101 mmol/L (ref 101–111)
Creatinine, Ser: 0.74 mg/dL (ref 0.44–1.00)
Glucose, Bld: 110 mg/dL — ABNORMAL HIGH (ref 65–99)
POTASSIUM: 4.1 mmol/L (ref 3.5–5.1)
Sodium: 134 mmol/L — ABNORMAL LOW (ref 135–145)

## 2016-07-14 LAB — PROTIME-INR
INR: 2.78
Prothrombin Time: 29.9 seconds — ABNORMAL HIGH (ref 11.4–15.2)

## 2016-07-14 LAB — APTT: aPTT: 38 seconds — ABNORMAL HIGH (ref 24–36)

## 2016-07-14 LAB — URINE MICROSCOPIC-ADD ON: Squamous Epithelial / LPF: NONE SEEN

## 2016-07-14 IMAGING — CT CT L SPINE W/O CM
3 of 4 series · 11 of 33 positions shown, 13 images · non-contrast
Comparison: None.

CLINICAL DATA: [AGE]/o F; history of compression deformity with
worsening back pain.

EXAM:
CT LUMBAR SPINE WITHOUT CONTRAST
TECHNIQUE: Multidetector CT imaging of the lumbar spine was performed without
intravenous contrast administration. Multiplanar CT image
reconstructions were also generated.

[Series 3: l-spine · axial · 0.30mm/px · z∈[+1060,+1228]mm · 3 of 128 slices shown, 4 images]
[im 22/128  soft-tissue]
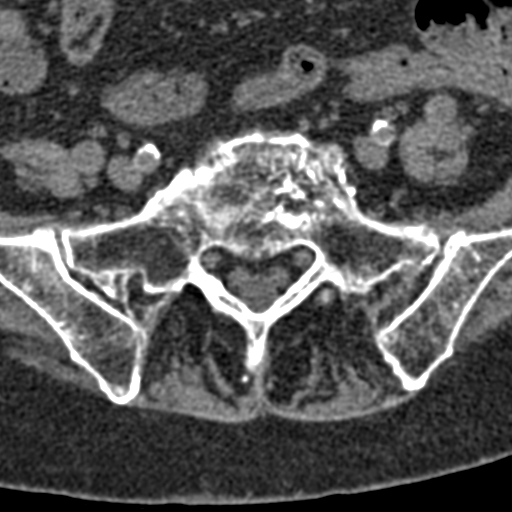
[im 22/128  bone]
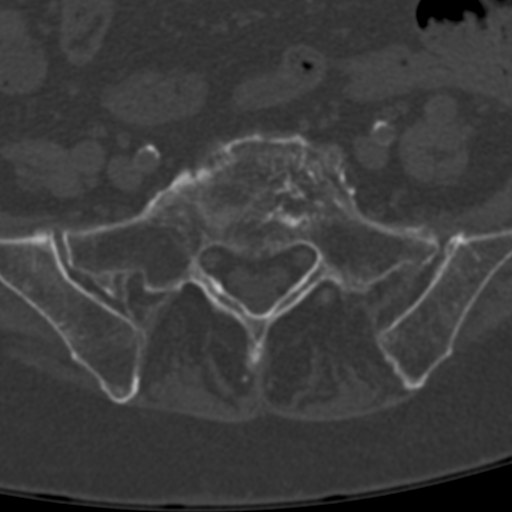
[im 64/128  bone]
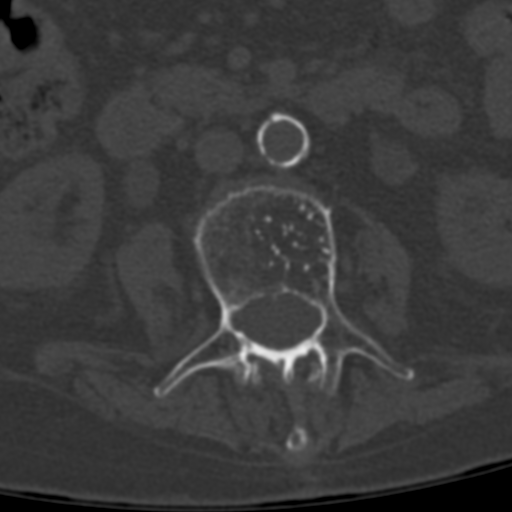
[im 106/128  bone]
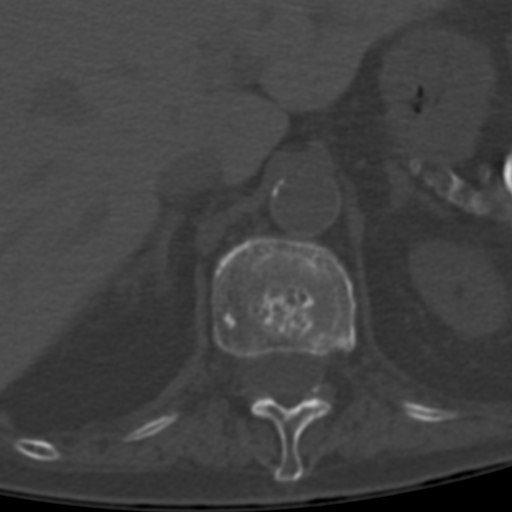

[Series 8: sagittal st · sagittal · 0.27mm/px · 5 of 71 slices shown, 6 images]
[im 24/71  bone]
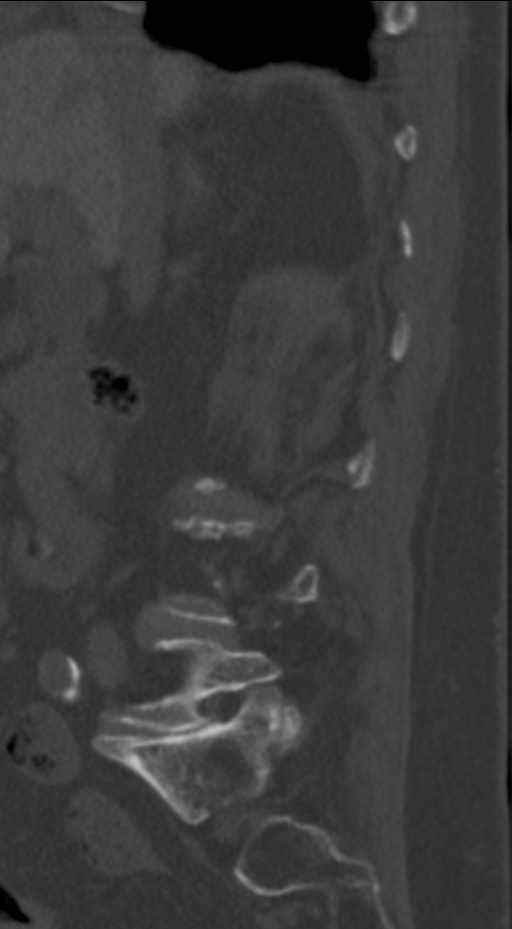
[im 30/71  bone]
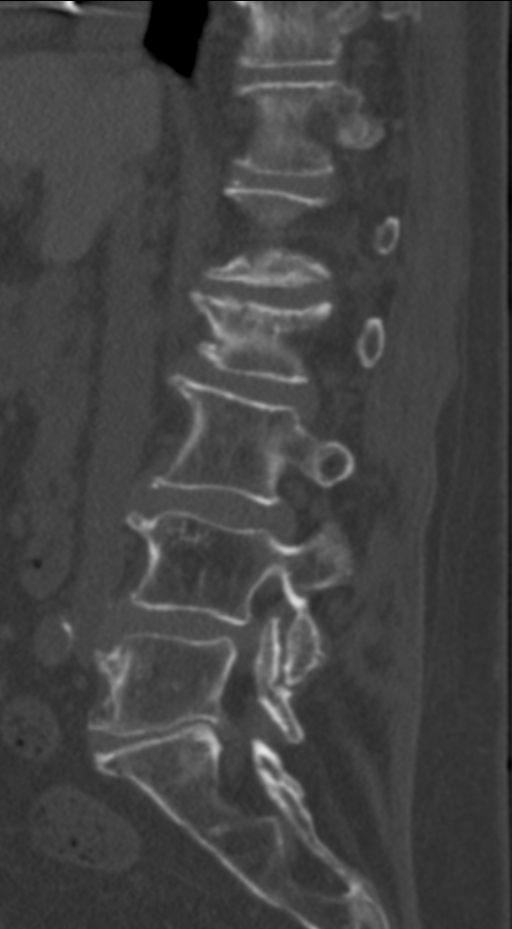
[im 36/71  soft-tissue]
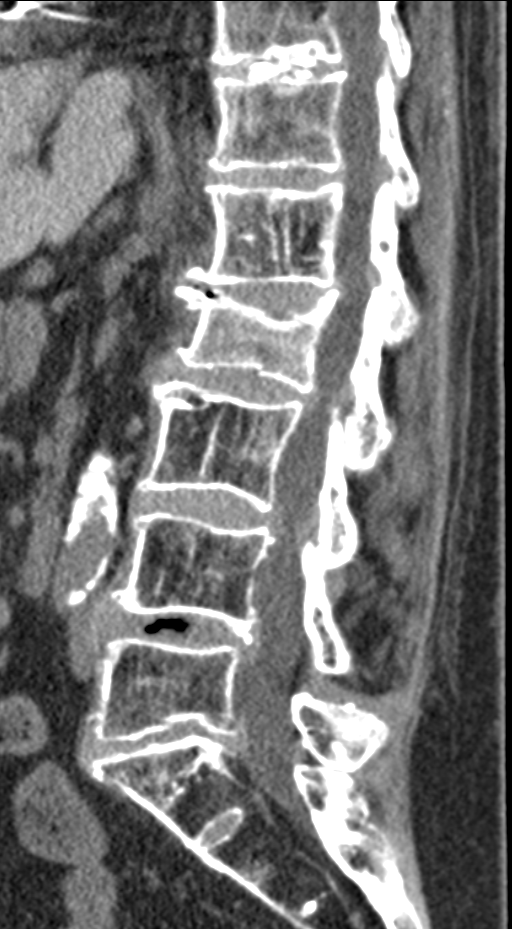
[im 36/71  bone]
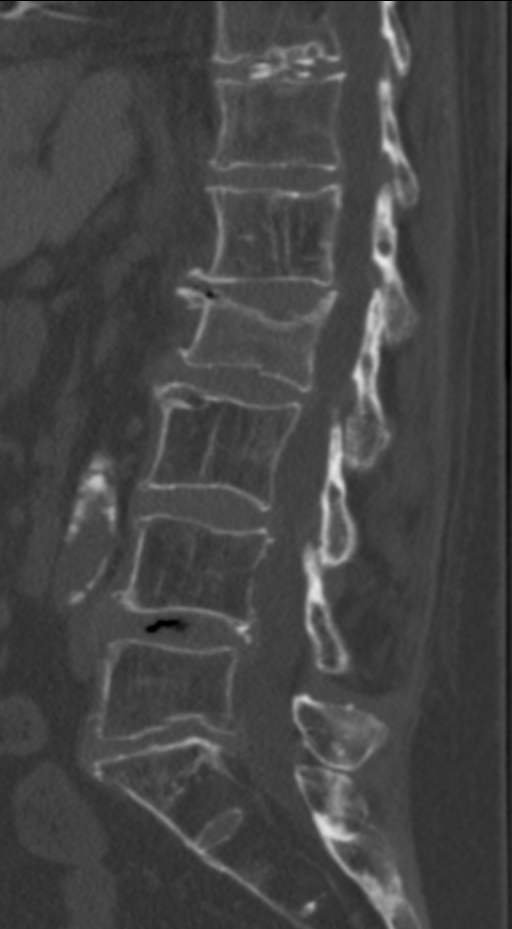
[im 41/71  bone]
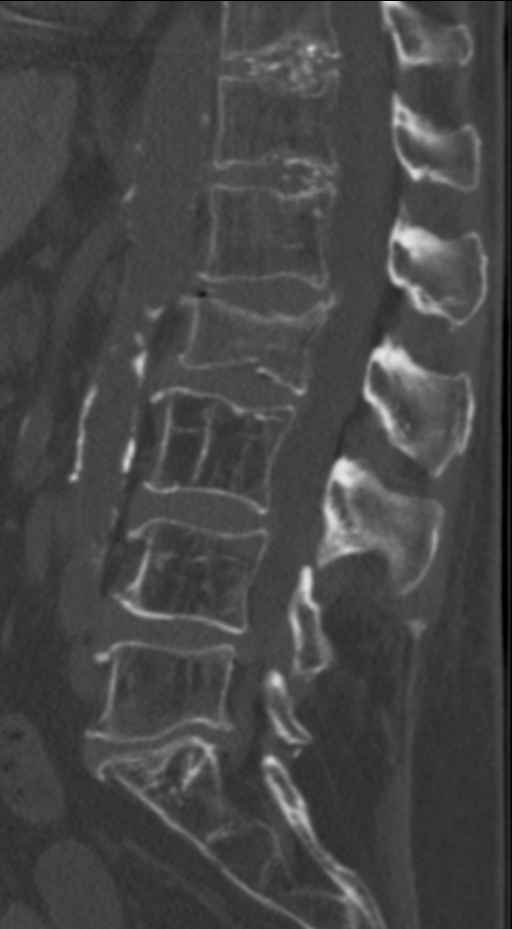
[im 47/71  bone]
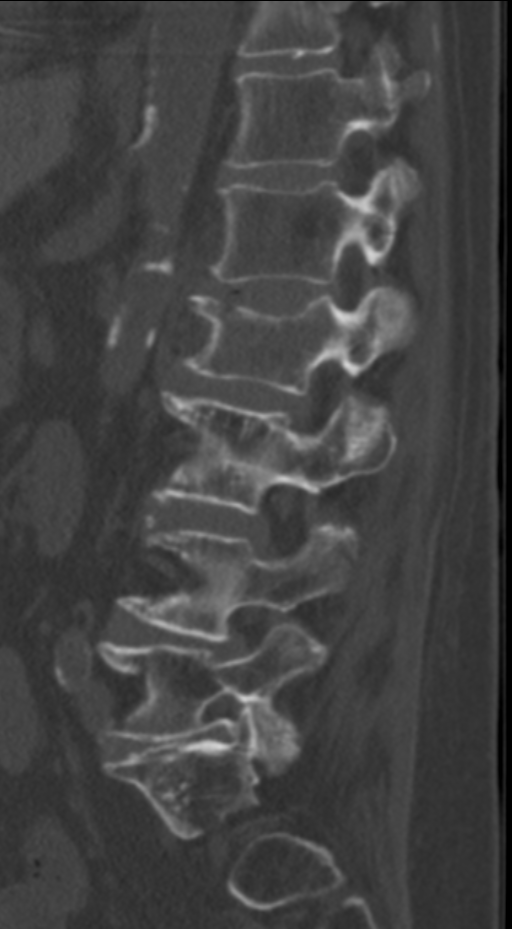

[Series 9: coronal st · coronal · 0.31mm/px · 3 of 67 slices shown]
[im 14/67  bone]
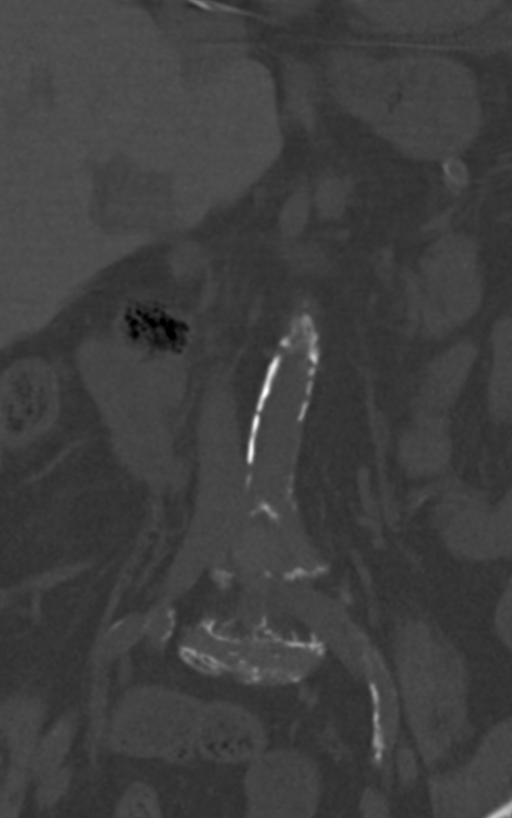
[im 27/67  bone]
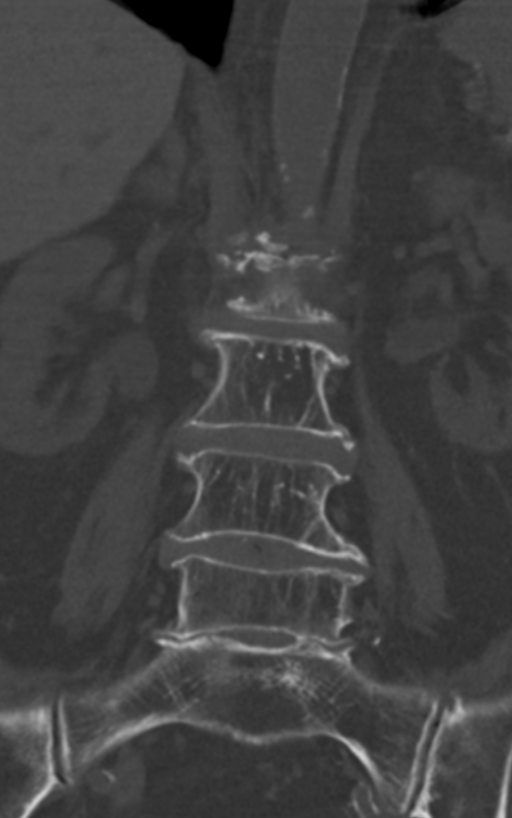
[im 40/67  bone]
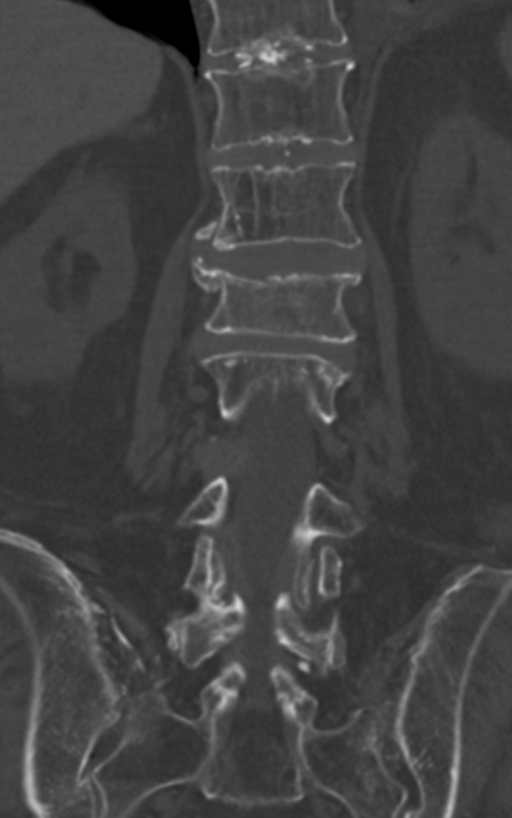

[11 of 33 positions shown; findings below may reference images not displayed]

FINDINGS: Five lumbar type non-rib-bearing vertebral bodies are present. There
is mild levocurvature with apex at L2-3. There is straightening of
lumbar lordosis. Mild L5-S1 retrolisthesis.

The bones are severely demineralized. There is a moderate
compression deformity of L2 involving both superior and inferior
endplates and is age indeterminate. There is minimal retropulsion of
bony elements posteriorly with mild canal stenosis.

There are multilevel disc bulges combined with ligamentum flavum and
facet hypertrophy at the L3 through S1 levels resulting in
mild-to-moderate canal stenosis and foraminal narrowing greatest at
the left L5-S1 level where it is moderate.

Severe calcific aortic atherosclerosis of the infrarenal abdominal
aorta. No acute intra-abdominal process is identified within the
field of view.
IMPRESSION: 1. L2 moderate compression deformity involving superior and inferior
endplates, age indeterminate.
2. Severe bone demineralization.
3. Moderate lumbar spondylosis as described.

By: DON LOLITO M.D.

## 2016-07-14 MED ORDER — HYDRALAZINE HCL 20 MG/ML IJ SOLN
10.0000 mg | Freq: Four times a day (QID) | INTRAMUSCULAR | Status: DC | PRN
  Administered 2016-07-14 – 2016-07-15 (×2): 10 mg via INTRAVENOUS
  Filled 2016-07-14 (×2): qty 1

## 2016-07-14 MED ORDER — ONDANSETRON HCL 4 MG/2ML IJ SOLN: 4.0000 mg | Freq: Four times a day (QID) | INTRAMUSCULAR | Status: DC | PRN

## 2016-07-14 MED ORDER — VITAMIN K1 10 MG/ML IJ SOLN
2.0000 mg | Freq: Once | INTRAVENOUS | Status: AC
  Administered 2016-07-14: 2 mg via INTRAVENOUS
  Filled 2016-07-14: qty 0.2

## 2016-07-14 MED ORDER — PHENAZOPYRIDINE HCL 100 MG PO TABS
100.0000 mg | ORAL_TABLET | Freq: Three times a day (TID) | ORAL | Status: DC
  Administered 2016-07-14 – 2016-07-15 (×6): 100 mg via ORAL
  Filled 2016-07-14 (×7): qty 1

## 2016-07-14 MED ORDER — ONDANSETRON HCL 4 MG PO TABS: 4.0000 mg | ORAL_TABLET | Freq: Four times a day (QID) | ORAL | Status: DC | PRN

## 2016-07-14 MED ORDER — OXYCODONE-ACETAMINOPHEN 5-325 MG PO TABS
1.0000 | ORAL_TABLET | Freq: Once | ORAL | Status: AC
  Administered 2016-07-14: 1 via ORAL
  Filled 2016-07-14: qty 1

## 2016-07-14 MED ORDER — TRAZODONE HCL 50 MG PO TABS: 25.0000 mg | ORAL_TABLET | Freq: Every evening | ORAL | Status: DC | PRN

## 2016-07-14 MED ORDER — AMIODARONE HCL 200 MG PO TABS
200.0000 mg | ORAL_TABLET | Freq: Every day | ORAL | Status: DC
  Administered 2016-07-14 – 2016-07-16 (×3): 200 mg via ORAL
  Filled 2016-07-14 (×6): qty 1

## 2016-07-14 MED ORDER — CEPHALEXIN 500 MG PO CAPS
500.0000 mg | ORAL_CAPSULE | Freq: Once | ORAL | Status: AC
  Administered 2016-07-14: 500 mg via ORAL
  Filled 2016-07-14: qty 1

## 2016-07-14 MED ORDER — METOPROLOL SUCCINATE ER 50 MG PO TB24
100.0000 mg | ORAL_TABLET | Freq: Two times a day (BID) | ORAL | Status: DC
  Administered 2016-07-14 – 2016-07-16 (×5): 100 mg via ORAL
  Filled 2016-07-14 (×4): qty 2
  Filled 2016-07-14: qty 1
  Filled 2016-07-14 (×2): qty 2

## 2016-07-14 MED ORDER — DEXTROSE 5 % IV SOLN
1.0000 g | Freq: Once | INTRAVENOUS | Status: AC
  Administered 2016-07-14: 1 g via INTRAVENOUS
  Filled 2016-07-14: qty 10

## 2016-07-14 MED ORDER — ACETAMINOPHEN 650 MG RE SUPP: 650.0000 mg | Freq: Four times a day (QID) | RECTAL | Status: DC | PRN

## 2016-07-14 MED ORDER — DEXTROSE 5 % IV SOLN
1.0000 g | INTRAVENOUS | Status: DC
  Administered 2016-07-15 – 2016-07-16 (×2): 1 g via INTRAVENOUS
  Filled 2016-07-14 (×2): qty 10

## 2016-07-14 MED ORDER — FUROSEMIDE 40 MG PO TABS: 40.0000 mg | ORAL_TABLET | Freq: Every day | ORAL | Status: DC | PRN

## 2016-07-14 MED ORDER — SODIUM CHLORIDE 0.9 % IV SOLN
INTRAVENOUS | Status: AC
  Administered 2016-07-14: 75 mL/h via INTRAVENOUS
  Administered 2016-07-14: 21:00:00 via INTRAVENOUS

## 2016-07-14 MED ORDER — KETOROLAC TROMETHAMINE 15 MG/ML IJ SOLN
15.0000 mg | Freq: Four times a day (QID) | INTRAMUSCULAR | Status: DC | PRN
  Administered 2016-07-14 – 2016-07-16 (×2): 15 mg via INTRAVENOUS
  Filled 2016-07-14 (×2): qty 1

## 2016-07-14 MED ORDER — ACETAMINOPHEN 325 MG PO TABS: 650.0000 mg | ORAL_TABLET | Freq: Four times a day (QID) | ORAL | Status: DC | PRN

## 2016-07-14 NOTE — ED Provider Notes (Signed)
[place] DEPT Provider Note   CSN: [id number] Arrival date & time: 07/14/16  0214     History   Chief Complaint Chief Complaint  Patient presents with  . Urinary Retention    HPI Anne Macias is a 82 y.o. female.  82 year old female with a history of paroxysmal atrial fibrillation on Coumadin, hypertension, dyslipidemia, and tremor presents to the emergency department for evaluation of urinary urgency with the sensation of incomplete bladder emptying. Symptoms began yesterday and worsened this evening. Patient had a decent void at midnight and awoke an hour later and felt as though she could not urinate. Husband states that she passed "a tablespoon of urine". Patient states that she feels as though she is "about to pop". She points to discomfort in her suprapubic abdomen, but denies any abdominal pain. She has had no hematuria, dysuria, nausea, vomiting, or fevers recently. She has been seen by her primary care doctor for compression fractures of her lumbar spine. She was supposed to have an outpatient CT study completed to further evaluate these injuries. She has had no bowel or bladder incontinence associated with symptoms tonight. Patient taking tramadol at home without relief.   The history is provided by the patient, the spouse and a relative. No language interpreter was used.    Past Medical History:  Diagnosis Date  . Chronic anticoagulation   . Edema of lower extremity   . Hyperlipidemia   . Hypertension   . PAF (paroxysmal atrial fibrillation) ([place]) 1998  . S/P cardiac pacemaker procedure Sept [id number]   complicated by pocket hematoma  . Tachy-brady syndrome ([place])   . Tremor 05/08/2014  . Visual disturbance     Patient Active Problem List   Diagnosis Date Noted  . Fall 06/20/2014  . Anticoagulated on Coumadin 06/20/2014  . Hyperlipidemia 06/20/2014  . Acute blood loss anemia 06/20/2014  . Pelvic fracture ([place]) 06/19/2014  . Tremor 05/08/2014  . Renal  insufficiency 03/12/2014  . Anorexia 03/12/2014  . Sinus node dysfunction ([place]) 02/17/2012  . ESSENTIAL HYPERTENSION, BENIGN 12/09/2010  . ATRIAL FIBRILLATION 12/09/2010  . PACEMAKER, PERMANENT-[employer] REVO 12/08/2010    Past Surgical History:  Procedure Laterality Date  . CARDIOVERSION N/A 02/26/2014   Procedure: CARDIOVERSION;  Surgeon: Deboraha Sprang, MD;  Location: [facility] ENDOSCOPY;  Service: Cardiovascular;  Laterality: N/A;  . CARDIOVERSION N/A 03/19/2014   Procedure: CARDIOVERSION;  Surgeon: Itzamara Spark, MD;  Location: [facility] ENDOSCOPY;  Service: Cardiovascular;  Laterality: N/A;  . CARDIOVERSION N/A 06/17/2015   Procedure: CARDIOVERSION;  Surgeon: Henrietta Spark, MD;  Location: [place];  Service: Cardiovascular;  Laterality: N/A;  . CATARACT EXTRACTION    . CHOLECYSTECTOMY  2005  . INSERT / REPLACE / REMOVE PACEMAKER  08/28/2010   implantaton  . US ECHOCARDIOGRAPHY  04/10/2010   EF 55-60%    OB History    No data available       Home Medications    Prior to Admission medications   Medication Sig Start Date End Date Taking? Authorizing Provider  acetaminophen-codeine (TYLENOL #3) 300-30 MG tablet Take 0.5-1 tablets by mouth every 6 (six) hours as needed for moderate pain or severe pain.  07/06/16  Yes Historical Provider, MD  amiodarone (PACERONE) 200 MG tablet Take 1 tablet (200 mg total) by mouth daily. 10/08/15  Yes Deboraha Sprang, MD  Calcium Carbonate-Vitamin D (CALTRATE 600+D PO) Take 2 tablets by mouth daily.     Yes Historical Provider, MD  furosemide (LASIX) 20 MG tablet Take  1 tablet (20 mg total) by mouth daily. For three days Patient taking differently: Take 40 mg by mouth daily as needed for fluid or edema.  11/07/14  Yes Deboraha Sprang, MD  metoprolol succinate (TOPROL-XL) 100 MG 24 hr tablet TAKE 1 TABLET BY MOUTH TWICE A DAY 07/13/16  Yes Deboraha Sprang, MD  Polyethyl Glycol-Propyl Glycol (SYSTANE OP) Place 1 drop into both eyes 2 (two) times daily.   Yes  Historical Provider, MD  potassium chloride (K-DUR,KLOR-CON) 10 MEQ tablet Take 10 mEq by mouth as needed (swelling).    Yes Historical Provider, MD  traMADol-acetaminophen (ULTRACET) 37.5-325 MG tablet Take 2 tablets by mouth every 6 (six) hours as needed for moderate pain or severe pain.  06/29/16  Yes Historical Provider, MD  warfarin (COUMADIN) 2 MG tablet Take 1-2 mg by mouth daily. Take 1mg  by mouth on Wednesday and Saturday. On all other days take 2 mg once daily. 04/30/15  Yes Historical Provider, MD    Family History Family History  Problem Relation Age of Onset  . Heart attack Mother   . Pneumonia Father   . Heart attack Brother   . Alcohol abuse Brother     Social History Social History  Substance Use Topics  . Smoking status: Never Smoker  . Smokeless tobacco: Never Used  . Alcohol use 0.6 oz/week    1 Glasses of wine per week     Comment: 5 oz red wine daily at dinner     Allergies   Review of patient's allergies indicates no known allergies.   Review of Systems Review of Systems  Constitutional: Negative for fever.  Gastrointestinal: Negative for nausea and vomiting.  Genitourinary: Positive for frequency and urgency. Negative for dysuria and hematuria.  Ten systems reviewed and are negative for acute change, except as noted in the HPI.    Physical Exam Updated Vital Signs BP 194/79   Pulse 71   Temp 97.9 F (36.6 C) (Oral)   Resp 18   Ht 5' 5.5" (1.664 m)   Wt 61.7 kg   SpO2 91%   BMI 22.29 kg/m   Physical Exam  Constitutional: She is oriented to person, place, and time. She appears well-developed and well-nourished. No distress.  Nontoxic appearing and in no distress  HENT:  Head: Normocephalic and atraumatic.  Eyes: Conjunctivae and EOM are normal. No scleral icterus.  Neck: Normal range of motion.  Cardiovascular: Normal rate, regular rhythm and intact distal pulses.   Pulmonary/Chest: Effort normal. No respiratory distress. She has no  wheezes. She has no rales.  Respirations even and unlabored  Abdominal: Soft. She exhibits no distension and no mass. There is no tenderness. There is no guarding.  Soft, nondistended, nontender abdomen.  Musculoskeletal: Normal range of motion.  Neurological: She is alert and oriented to person, place, and time.  GCS 15. Patient moving all extremities.  Skin: Skin is warm and dry. No rash noted. She is not diaphoretic. No erythema. No pallor.  Psychiatric: She has a normal mood and affect. Her behavior is normal.  Nursing note and vitals reviewed.    ED Treatments / Results  Labs (all labs ordered are listed, but only abnormal results are displayed) Labs Reviewed  URINALYSIS, ROUTINE W REFLEX MICROSCOPIC (NOT AT [facility]) - Abnormal; Notable for the following:       Result Value   APPearance CLOUDY (*)    Hgb urine dipstick TRACE (*)    Nitrite POSITIVE (*)    Leukocytes,  UA SMALL (*)    All other components within normal limits  CBC WITH DIFFERENTIAL/PLATELET - Abnormal; Notable for the following:    RDW 16.3 (*)    All other components within normal limits  BASIC METABOLIC PANEL - Abnormal; Notable for the following:    Sodium 134 (*)    Glucose, Bld 110 (*)    Calcium 8.0 (*)    All other components within normal limits  URINE MICROSCOPIC-ADD ON - Abnormal; Notable for the following:    Bacteria, UA MANY (*)    All other components within normal limits  URINE CULTURE    EKG  EKG Interpretation None       Radiology Ct Lumbar Spine Wo Contrast  Result Date: 07/14/2016 CLINICAL DATA:  82 y/o F; history of compression deformity with worsening back pain. EXAM: CT LUMBAR SPINE WITHOUT CONTRAST TECHNIQUE: Multidetector CT imaging of the lumbar spine was performed without intravenous contrast administration. Multiplanar CT image reconstructions were also generated. COMPARISON:  None. FINDINGS: Five lumbar type non-rib-bearing vertebral bodies are present. There is mild  levocurvature with apex at L2-3. There is straightening of lumbar lordosis. Mild L5-S1 retrolisthesis. The bones are severely demineralized. There is a moderate compression deformity of L2 involving both superior and inferior endplates and is age indeterminate. There is minimal retropulsion of bony elements posteriorly with mild canal stenosis. There are multilevel disc bulges combined with ligamentum flavum and facet hypertrophy at the L3 through S1 levels resulting in mild-to-moderate canal stenosis and foraminal narrowing greatest at the left L5-S1 level where it is moderate. Severe calcific aortic atherosclerosis of the infrarenal abdominal aorta. No acute intra-abdominal process is identified within the field of view. IMPRESSION: 1. L2 moderate compression deformity involving superior and inferior endplates, age indeterminate. 2. Severe bone demineralization. 3. Moderate lumbar spondylosis as described. Electronically Signed   By: Kristine Garbe M.D.   On: 07/14/2016 04:43    Procedures Procedures (including critical care time)  Medications Ordered in ED Medications  phenazopyridine (PYRIDIUM) tablet 100 mg (100 mg Oral Given 07/14/16 0320)  cefTRIAXone (ROCEPHIN) 1 g in dextrose 5 % 50 mL IVPB (not administered)  cephALEXin (KEFLEX) capsule 500 mg (500 mg Oral Given 07/14/16 0424)  oxyCODONE-acetaminophen (PERCOCET/ROXICET) 5-325 MG per tablet 1 tablet (1 tablet Oral Given 07/14/16 0524)     Initial Impression / Assessment and Plan / ED Course  I have reviewed the triage vital signs and the nursing notes.  Pertinent labs & imaging results that were available during my care of the patient were reviewed by me and considered in my medical decision making (see chart for details).  Clinical Course    Patient presents to the emergency department for evaluation of urinary urgency and feeling of incomplete bladder emptying. Patient without urinary retention on arrival. She was found to  have a urinary tract infection. Patient with known history of lumbar compression fracture which was discovered outpatient on x-ray. She was scheduled to have an outpatient CT scan of her lumbar spine performed for evaluation of persistent back pain. Patient has no signs of cauda equina today. Her lumbar CT shows indeterminant L2 compression fracture.  Patient continues to have persistent abdominal pain and pressure. Question whether worsening back pain may be secondary to early pyelonephritis. Patient treated with IV Rocephin. Plan to admit for observation and additional antibiotics. Son of patient is Dr. Harlow Mares, plastic surgery.   Final Clinical Impressions(s) / ED Diagnoses   Final diagnoses:  Pyelonephritis    New Prescriptions  New Prescriptions   No medications on file     Antonietta Breach, PA-C 07/14/16 [id number]    Everlene Balls, MD 07/14/16 [phone]

## 2016-07-14 NOTE — Plan of Care (Signed)
82 year old female with atrial fibrillation who was recently diagnosed with L2 compression fracture last week presents with worsening back pain. Has UTI and CT does show L2 compression fracture. Patient admitted for further observation.  Anne Macias.

## 2016-07-14 NOTE — Progress Notes (Signed)
Chief Complaint: Patient was seen in consultation today for L2 compression fracture at the request of Dr. Lala Lund  Referring Physician(s): *Dr. Lala Lund  Supervising Physician: Corrie Mckusick  Patient Status: Inpatient  History of Present Illness: Anne Macias is a pleasant 82 y.o. female admitted with back pain secondary to new L2 compression fracture as well as UTI. Pain started about 4-5 days and was unbearable last night, which prompted her coming to the hospital. PMHx includes afib for which she is on chronic Coumadin therapy. She underwent a CT which shows L2 compression fracture. Her primary c/p pain in the mid-low back. No radiation or neuropathy associated. She does feel some better, with less pain than when she was admitted. Her husband is at bedside, and her son, a Therapist, music, is coming sometime today as well. She has been NPO all day.  Past Medical History:  Diagnosis Date  . Chronic anticoagulation   . Edema of lower extremity   . Hyperlipidemia   . Hypertension   . PAF (paroxysmal atrial fibrillation) ([place]) 1998  . S/P cardiac pacemaker procedure Sept [id number]   complicated by pocket hematoma  . Tachy-brady syndrome ([place])   . Tremor 05/08/2014  . Visual disturbance     Past Surgical History:  Procedure Laterality Date  . CARDIOVERSION N/A 02/26/2014   Procedure: CARDIOVERSION;  Surgeon: Deboraha Sprang, MD;  Location: [facility] ENDOSCOPY;  Service: Cardiovascular;  Laterality: N/A;  . CARDIOVERSION N/A 03/19/2014   Procedure: CARDIOVERSION;  Surgeon: Karigan Spark, MD;  Location: [facility] ENDOSCOPY;  Service: Cardiovascular;  Laterality: N/A;  . CARDIOVERSION N/A 06/17/2015   Procedure: CARDIOVERSION;  Surgeon: Nevaen Spark, MD;  Location: [place];  Service: Cardiovascular;  Laterality: N/A;  . CATARACT EXTRACTION    . CHOLECYSTECTOMY  2005  . INSERT / REPLACE / REMOVE PACEMAKER  08/28/2010   implantaton  . US ECHOCARDIOGRAPHY   04/10/2010   EF 55-60%    Allergies: Review of patient's allergies indicates no known allergies.  Medications:  Current Facility-Administered Medications:  .  0.9 %  sodium chloride infusion, , Intravenous, Continuous, Lezlie Octave Black, NP, Last Rate: 75 mL/hr at 07/14/16 1027, 75 mL/hr at 07/14/16 1027 .  acetaminophen (TYLENOL) tablet 650 mg, 650 mg, Oral, Q6H PRN **OR** acetaminophen (TYLENOL) suppository 650 mg, 650 mg, Rectal, Q6H PRN, Lezlie Octave Black, NP .  amiodarone (PACERONE) tablet 200 mg, 200 mg, Oral, Daily, Antonietta Breach, PA-C, 200 mg at 07/14/16 1312 .  [START ON 07/15/2016] cefTRIAXone (ROCEPHIN) 1 g in dextrose 5 % 50 mL IVPB, 1 g, Intravenous, Q24H, Lezlie Octave Black, NP .  furosemide (LASIX) tablet 40 mg, 40 mg, Oral, Daily PRN, Lezlie Octave Black, NP .  hydrALAZINE (APRESOLINE) injection 10 mg, 10 mg, Intravenous, Q6H PRN, Thurnell Lose, MD, 10 mg at 07/14/16 1032 .  ketorolac (TORADOL) 15 MG/ML injection 15 mg, 15 mg, Intravenous, Q6H PRN, Radene Gunning, NP .  metoprolol succinate (TOPROL-XL) 24 hr tablet 100 mg, 100 mg, Oral, BID, Antonietta Breach, PA-C, 100 mg at 07/14/16 0654 .  ondansetron (ZOFRAN) tablet 4 mg, 4 mg, Oral, Q6H PRN **OR** ondansetron (ZOFRAN) injection 4 mg, 4 mg, Intravenous, Q6H PRN, Lezlie Octave Black, NP .  phenazopyridine (PYRIDIUM) tablet 100 mg, 100 mg, Oral, TID WC, Antonietta Breach, PA-C, 100 mg at 07/14/16 1312 .  traZODone (DESYREL) tablet 25 mg, 25 mg, Oral, QHS PRN, Radene Gunning, NP    Family History  Problem Relation Age  of Onset  . Heart attack Mother   . Pneumonia Father   . Heart attack Brother   . Alcohol abuse Brother     Social History   Social History  . Marital status: Married    Spouse name: N/A  . Number of children: N/A  . Years of education: N/A   Social History Main Topics  . Smoking status: Never Smoker  . Smokeless tobacco: Never Used  . Alcohol use 0.6 oz/week    1 Glasses of wine per week     Comment: 5 oz red wine daily at dinner    . Drug use: No  . Sexual activity: Not Asked   Other Topics Concern  . None   Social History Narrative  . None     Review of Systems: A 12 point ROS discussed and pertinent positives are indicated in the HPI above.  All other systems are negative.  Review of Systems  Vital Signs: BP (!) 157/72   Pulse 70   Temp 98.4 F (36.9 C) (Oral)   Resp 16   Ht 5\' 6"  (1.676 m)   Wt 129 lb (58.5 kg)   SpO2 98%   BMI 20.82 kg/m   Physical Exam  Constitutional: She is oriented to person, place, and time. She appears well-developed. No distress.  HENT:  Head: Normocephalic.  Mouth/Throat: Oropharynx is clear and moist.  Neck: Normal range of motion. No JVD present. No tracheal deviation present.  Cardiovascular: Exam reveals no friction rub.   No murmur heard. Regular rate, no murmurs  Pulmonary/Chest: Effort normal and breath sounds normal. No respiratory distress. She has no wheezes.  Musculoskeletal:  Back: no defects Mildly tender mid lumbar region  Neurological: She is alert and oriented to person, place, and time.  Psychiatric: She has a normal mood and affect. Judgment normal.      Imaging: Ct Lumbar Spine Wo Contrast  Result Date: 07/14/2016 CLINICAL DATA:  82 y/o F; history of compression deformity with worsening back pain. EXAM: CT LUMBAR SPINE WITHOUT CONTRAST TECHNIQUE: Multidetector CT imaging of the lumbar spine was performed without intravenous contrast administration. Multiplanar CT image reconstructions were also generated. COMPARISON:  None. FINDINGS: Five lumbar type non-rib-bearing vertebral bodies are present. There is mild levocurvature with apex at L2-3. There is straightening of lumbar lordosis. Mild L5-S1 retrolisthesis. The bones are severely demineralized. There is a moderate compression deformity of L2 involving both superior and inferior endplates and is age indeterminate. There is minimal retropulsion of bony elements posteriorly with mild canal  stenosis. There are multilevel disc bulges combined with ligamentum flavum and facet hypertrophy at the L3 through S1 levels resulting in mild-to-moderate canal stenosis and foraminal narrowing greatest at the left L5-S1 level where it is moderate. Severe calcific aortic atherosclerosis of the infrarenal abdominal aorta. No acute intra-abdominal process is identified within the field of view. IMPRESSION: 1. L2 moderate compression deformity involving superior and inferior endplates, age indeterminate. 2. Severe bone demineralization. 3. Moderate lumbar spondylosis as described. Electronically Signed   By: Kristine Garbe M.D.   On: 07/14/2016 04:43    Labs:  CBC:  Recent Labs  07/14/16 0427  WBC 6.6  HGB 13.1  HCT 41.1  PLT 222    COAGS:  Recent Labs  07/14/16 0900  INR 2.78  APTT 38*    BMP:  Recent Labs  07/14/16 0427  NA 134*  K 4.1  CL 101  CO2 26  GLUCOSE 110*  BUN 19  CALCIUM  8.0*  CREATININE 0.74  GFRNONAA >60  GFRAA >60    LIVER FUNCTION TESTS:  Recent Labs  08/22/15 1530 12/24/15 1530  BILITOT 0.5 0.6  AST 21 30  ALT 21 30  ALKPHOS 51 59  PROT 6.3* 6.0*  ALBUMIN 2.9* 2.6*    TUMOR MARKERS: No results for input(s): AFPTM, CEA, CA199, CHROMGRNA in the last 8760 hours.  Assessment and Plan: L2 compression fracture, probably acute UTI, on abx Images reviewed by both Dr. Earleen Newport and Dr. Estanislado Pandy, no further imaging necessary given hx and exam findings. Dicussed possible Kyphoplasty as a treatment option. The procedure, risks, complications were explained in detail to pt and her husband, including use of and risks of sedation. UTI will need to be treated with repeat (-)UA. Coumadin would need to be help with INR at or below 1.5 for safe procedure. However, it sounds as though the pt is leaning towards conservative treatment.  Procedure could certainly be offered if conservative therapy fails and pt is then agreeable. Will let pt decide  after discussion with her son.   Thank you for this interesting consult.  I greatly enjoyed meeting Anne Macias and look forward to participating in their care.  A copy of this report was sent to the requesting provider on this date.  Electronically Signed: Ascencion Dike 07/14/2016, 1:19 PM   I spent a total of 25 minutes in face to face in clinical consultation, greater than 50% of which was counseling/coordinating care for L2 compression fracture

## 2016-07-14 NOTE — ED Triage Notes (Signed)
Patient accompanied by her son and husband. C/O urinary rentention and back pain. Patient taking Tramadol at home for pain. Patient reports she voided at midnight a small amount.

## 2016-07-14 NOTE — H&P (Signed)
History and Physical    Anne Macias [id number] DOB: Dec 28, 1922 DOA: 07/14/2016  PCP: Irven Shelling, MD Patient coming from: home  Chief Complaint: intractable back pain  HPI: Anne Macias is a delightful 82 y.o. female with medical history significant for A. fib on Coumadin, tachybradycardia syndrome status post pacemaker 2011, hypertension, hyperlipidemia presents to the emergency Department chief complaint of intractable back pain. Initial evaluation reveals L2 compression fracture, urinary tract infection.  Information is obtained from the patient and the chart. She reports eating in her usual state of health about 4 days ago when she got up from the kitchen table and felt a "twinge" in her low back. She states "I didn't think much of it and went on about my business". As the days progressed she developed worsening soreness and noticed difficulty rising up off the toilet. Last night pain progressed to "unbearable" levels. She denies any numbness or tingling of lower extremities or incontinence of urine or bowel. He denies any weakness of lower extremities. She does endorse some urinary frequency and urgency. She denies dysuria hematuria. She denies fever chills nausea vomiting abdominal pain. She denies chest pain palpitations headache dizziness syncope or near-syncope. She does report taking tramadol at home with only minimal relief.    ED Course: She is afebrile hemodynamically stable and not hypoxic.  Review of Systems: As per HPI otherwise 10 point review of systems negative.   Ambulatory Status: Ambulates independently with steady gait no recent falls  Past Medical History:  Diagnosis Date  . Chronic anticoagulation   . Edema of lower extremity   . Hyperlipidemia   . Hypertension   . PAF (paroxysmal atrial fibrillation) ([place]) 1998  . S/P cardiac pacemaker procedure Sept [id number]   complicated by pocket hematoma  . Tachy-brady syndrome ([place])   . Tremor 05/08/2014    . Visual disturbance     Past Surgical History:  Procedure Laterality Date  . CARDIOVERSION N/A 02/26/2014   Procedure: CARDIOVERSION;  Surgeon: Deboraha Sprang, MD;  Location: [facility] ENDOSCOPY;  Service: Cardiovascular;  Laterality: N/A;  . CARDIOVERSION N/A 03/19/2014   Procedure: CARDIOVERSION;  Surgeon: Eulla Spark, MD;  Location: [facility] ENDOSCOPY;  Service: Cardiovascular;  Laterality: N/A;  . CARDIOVERSION N/A 06/17/2015   Procedure: CARDIOVERSION;  Surgeon: Marchel Spark, MD;  Location: [place];  Service: Cardiovascular;  Laterality: N/A;  . CATARACT EXTRACTION    . CHOLECYSTECTOMY  2005  . INSERT / REPLACE / REMOVE PACEMAKER  08/28/2010   implantaton  . US ECHOCARDIOGRAPHY  04/10/2010   EF 55-60%    Social History   Social History  . Marital status: Married    Spouse name: N/A  . Number of children: N/A  . Years of education: N/A   Occupational History  . Not on file.   Social History Main Topics  . Smoking status: Never Smoker  . Smokeless tobacco: Never Used  . Alcohol use 0.6 oz/week    1 Glasses of wine per week     Comment: 5 oz red wine daily at dinner  . Drug use: No  . Sexual activity: Not on file   Other Topics Concern  . Not on file   Social History Narrative  . No narrative on file    No Known Allergies  Family History  Problem Relation Age of Onset  . Heart attack Mother   . Pneumonia Father   . Heart attack Brother   . Alcohol abuse Brother  Prior to Admission medications   Medication Sig Start Date End Date Taking? Authorizing Provider  acetaminophen-codeine (TYLENOL #3) 300-30 MG tablet Take 0.5-1 tablets by mouth every 6 (six) hours as needed for moderate pain or severe pain.  07/06/16  Yes Historical Provider, MD  amiodarone (PACERONE) 200 MG tablet Take 1 tablet (200 mg total) by mouth daily. 10/08/15  Yes Deboraha Sprang, MD  Calcium Carbonate-Vitamin D (CALTRATE 600+D PO) Take 2 tablets by mouth daily.     Yes Historical  Provider, MD  furosemide (LASIX) 20 MG tablet Take 1 tablet (20 mg total) by mouth daily. For three days Patient taking differently: Take 40 mg by mouth daily as needed for fluid or edema.  11/07/14  Yes Deboraha Sprang, MD  metoprolol succinate (TOPROL-XL) 100 MG 24 hr tablet TAKE 1 TABLET BY MOUTH TWICE A DAY 07/13/16  Yes Deboraha Sprang, MD  Polyethyl Glycol-Propyl Glycol (SYSTANE OP) Place 1 drop into both eyes 2 (two) times daily.   Yes Historical Provider, MD  potassium chloride (K-DUR,KLOR-CON) 10 MEQ tablet Take 10 mEq by mouth as needed (swelling).    Yes Historical Provider, MD  traMADol-acetaminophen (ULTRACET) 37.5-325 MG tablet Take 2 tablets by mouth every 6 (six) hours as needed for moderate pain or severe pain.  06/29/16  Yes Historical Provider, MD  warfarin (COUMADIN) 2 MG tablet Take 1-2 mg by mouth daily. Take 1mg  by mouth on Wednesday and Saturday. On all other days take 2 mg once daily. 04/30/15  Yes Historical Provider, MD    Physical Exam: Vitals:   07/14/16 0600 07/14/16 0654 07/14/16 0656 07/14/16 0824  BP: 165/82 148/71 148/71 (!) 203/83  Pulse: 70 67 69 71  Resp: 18  18 16   Temp:    98 F (36.7 C)  TempSrc:    Oral  SpO2: 90%  95% 98%  Weight:    58.5 kg (129 lb)  Height:    5\' 6"  (1.676 m)     General:  Appears calm and comfortable No acute distress Eyes:  PERRL, EOMI, normal lids, iris ENT:  grossly normal hearing, lips & tongue, mucous membranes of her mouth are pink but dry Neck:  no LAD, masses or thyromegaly Cardiovascular:  RRR, no m/r/g. No LE edema. Pedal pulses present and palpable Respiratory:  CTA bilaterally, no w/r/r. Normal respiratory effort. Abdomen:  soft, ntnd, positive bowel sounds but somewhat sluggish to guarding or rebounding Skin:  no rash or induration seen on limited exam Musculoskeletal:  grossly normal tone BUE/BLE, good ROM, no bony abnormality Psychiatric:  grossly normal mood and affect, speech fluent and appropriate,  AOx3 Neurologic:  CN 2-12 grossly intact, moves all extremities in coordinated fashion, sensation intact bilateral lower extremity strength 5 out of 5 sensation intact  Labs on Admission: I have personally reviewed following labs and imaging studies  CBC:  Recent Labs Lab 07/14/16 0427  WBC 6.6  NEUTROABS 4.3  HGB 13.1  HCT 41.1  MCV 85.6  PLT [id number]   Basic Metabolic Panel:  Recent Labs Lab 07/14/16 0427  NA 134*  K 4.1  CL 101  CO2 26  GLUCOSE 110*  BUN 19  CREATININE 0.74  CALCIUM 8.0*   GFR: Estimated Creatinine Clearance: 42.3 mL/min (by C-G formula based on SCr of 0.8 mg/dL). Liver Function Tests: No results for input(s): AST, ALT, ALKPHOS, BILITOT, PROT, ALBUMIN in the last 168 hours. No results for input(s): LIPASE, AMYLASE in the last 168 hours. No results for input(s):  AMMONIA in the last 168 hours. Coagulation Profile: No results for input(s): INR, PROTIME in the last 168 hours. Cardiac Enzymes: No results for input(s): CKTOTAL, CKMB, CKMBINDEX, TROPONINI in the last 168 hours. BNP (last 3 results) No results for input(s): PROBNP in the last 8760 hours. HbA1C: No results for input(s): HGBA1C in the last 72 hours. CBG: No results for input(s): GLUCAP in the last 168 hours. Lipid Profile: No results for input(s): CHOL, HDL, LDLCALC, TRIG, CHOLHDL, LDLDIRECT in the last 72 hours. Thyroid Function Tests: No results for input(s): TSH, T4TOTAL, FREET4, T3FREE, THYROIDAB in the last 72 hours. Anemia Panel: No results for input(s): VITAMINB12, FOLATE, FERRITIN, TIBC, IRON, RETICCTPCT in the last 72 hours. Urine analysis:    Component Value Date/Time   COLORURINE YELLOW 07/14/2016 0309   APPEARANCEUR CLOUDY (A) 07/14/2016 0309   LABSPEC 1.025 07/14/2016 0309   PHURINE 6.0 07/14/2016 0309   GLUCOSEU NEGATIVE 07/14/2016 0309   HGBUR TRACE (A) 07/14/2016 0309   BILIRUBINUR NEGATIVE 07/14/2016 0309   KETONESUR NEGATIVE 07/14/2016 0309   PROTEINUR NEGATIVE  07/14/2016 0309   NITRITE POSITIVE (A) 07/14/2016 0309   LEUKOCYTESUR SMALL (A) 07/14/2016 0309    Creatinine Clearance: Estimated Creatinine Clearance: 42.3 mL/min (by C-G formula based on SCr of 0.8 mg/dL).  Sepsis Labs: @LABRCNTIP (procalcitonin:4,lacticidven:4) )No results found for this or any previous visit (from the past 240 hour(s)).   Radiological Exams on Admission: Ct Lumbar Spine Wo Contrast  Result Date: 07/14/2016 CLINICAL DATA:  82 y/o F; history of compression deformity with worsening back pain. EXAM: CT LUMBAR SPINE WITHOUT CONTRAST TECHNIQUE: Multidetector CT imaging of the lumbar spine was performed without intravenous contrast administration. Multiplanar CT image reconstructions were also generated. COMPARISON:  None. FINDINGS: Five lumbar type non-rib-bearing vertebral bodies are present. There is mild levocurvature with apex at L2-3. There is straightening of lumbar lordosis. Mild L5-S1 retrolisthesis. The bones are severely demineralized. There is a moderate compression deformity of L2 involving both superior and inferior endplates and is age indeterminate. There is minimal retropulsion of bony elements posteriorly with mild canal stenosis. There are multilevel disc bulges combined with ligamentum flavum and facet hypertrophy at the L3 through S1 levels resulting in mild-to-moderate canal stenosis and foraminal narrowing greatest at the left L5-S1 level where it is moderate. Severe calcific aortic atherosclerosis of the infrarenal abdominal aorta. No acute intra-abdominal process is identified within the field of view. IMPRESSION: 1. L2 moderate compression deformity involving superior and inferior endplates, age indeterminate. 2. Severe bone demineralization. 3. Moderate lumbar spondylosis as described. Electronically Signed   By: Kristine Garbe M.D.   On: 07/14/2016 04:43    EKG:   Assessment/Plan Principal Problem:   Compression fracture Active Problems:    Essential hypertension, benign   ATRIAL FIBRILLATION   Pyelonephritis   UTI (urinary tract infection)   #1. Compression fracture L2 per CT as noted above. No history of fall or injury. Pain fairly well controlled on admission. -Admit -Pain management -Interventional radiology for possible kyphoplasty -Hold Coumadin for now anticipation of procedure -heparin per pharmacy -Nothing by mouth except meds for now -Gentle IV fluids  #2. Atrial fib on Coumadin. Currently rate controlled. INR pending.Mali score 4. Home medications include amiodarone metoprolol -Continue amiodarone and metoprolol -heparin per pharmacy -Monitor closely -Hold Coumadin for now  #3. Hypertension. Blood pressure on high end of normal. Likely related to pain. Home medications as noted above -Provide home meds -Monitor closely  #4. Urinary tract infection. Currently afebrile hemodynamically stable and  nontoxic appearing. No leukocytosis. Urinalysis consistent with UTI. Received Rocephin prior to admission -Follow urine culture -Continue Rocephin    DVT prophylaxis: scd  Code Status: full  Family Communication: husband at bedsdie  Disposition Plan: home  Consults called: IR  Admission status: obs    Radene Gunning MD [facility]  If 7PM-7AM, please contact night-coverage [email or web address] Password [employer]  07/14/2016, 8:55 AM

## 2016-07-14 NOTE — Progress Notes (Addendum)
ANTICOAGULATION CONSULT NOTE - Initial Consult  Pharmacy Consult for Heparin IV Indication: atrial fibrillation  No Known Allergies  Patient Measurements: Height: 5\' 6"  (167.6 cm) Weight: 129 lb (58.5 kg) IBW/kg (Calculated) : 59.3 Heparin Dosing Weight: 58.5 kg  Vital Signs: Temp: 98 F (36.7 C) (08/15 0824) Temp Source: Oral (08/15 0824) BP: 203/83 (08/15 0824) Pulse Rate: 71 (08/15 0824)  Labs:  Recent Labs  07/14/16 0427  HGB 13.1  HCT 41.1  PLT 222  CREATININE 0.74    Estimated Creatinine Clearance: 42.3 mL/min (by C-G formula based on SCr of 0.8 mg/dL).   Medical History: Past Medical History:  Diagnosis Date  . Chronic anticoagulation   . Edema of lower extremity   . Hyperlipidemia   . Hypertension   . PAF (paroxysmal atrial fibrillation) ([place]) 1998  . S/P cardiac pacemaker procedure Sept [id number]   complicated by pocket hematoma  . Tachy-brady syndrome ([place])   . Tremor 05/08/2014  . Visual disturbance     Medications:  Prescriptions Prior to Admission  Medication Sig Dispense Refill Last Dose  . acetaminophen-codeine (TYLENOL #3) 300-30 MG tablet Take 0.5-1 tablets by mouth every 6 (six) hours as needed for moderate pain or severe pain.   0 07/12/2016  . amiodarone (PACERONE) 200 MG tablet Take 1 tablet (200 mg total) by mouth daily. 90 tablet 3 07/13/2016 at Unknown time  . Calcium Carbonate-Vitamin D (CALTRATE 600+D PO) Take 2 tablets by mouth daily.     07/13/2016 at Unknown time  . furosemide (LASIX) 20 MG tablet Take 1 tablet (20 mg total) by mouth daily. For three days (Patient taking differently: Take 40 mg by mouth daily as needed for fluid or edema. ) 3 tablet 0 unknown  . metoprolol succinate (TOPROL-XL) 100 MG 24 hr tablet TAKE 1 TABLET BY MOUTH TWICE A DAY 180 tablet 1 07/13/2016 at 1900  . Polyethyl Glycol-Propyl Glycol (SYSTANE OP) Place 1 drop into both eyes 2 (two) times daily.   07/13/2016 at Unknown time  . potassium chloride (K-DUR,KLOR-CON)  10 MEQ tablet Take 10 mEq by mouth as needed (swelling).      . traMADol-acetaminophen (ULTRACET) 37.5-325 MG tablet Take 2 tablets by mouth every 6 (six) hours as needed for moderate pain or severe pain.   0 07/13/2016 at 2300  . warfarin (COUMADIN) 2 MG tablet Take 1-2 mg by mouth daily. Take 1mg  by mouth on Wednesday and Saturday. On all other days take 2 mg once daily.  4 07/13/2016 at 1900   Scheduled:  . amiodarone  200 mg Oral Daily  . [START ON 07/15/2016] cefTRIAXone (ROCEPHIN)  IV  1 g Intravenous Q24H  . metoprolol succinate  100 mg Oral BID  . phenazopyridine  100 mg Oral TID WC    Assessment: Anne Macias with PMH AFib on coumadin (CHADS2-VASc > 4), pacemaker, admitted with vertebral compression fracture and UTI. To hold coumadin for possible procedure and bridge with heparin per pharmacy.   Baseline INR 2.78; aPTT elevated  Prior anticoagulation: coumadin 2 mg daily except 1 mg Wed and Sat  Significant events:  Today, 07/14/2016:  CBC: wnl  No bleeding or infusion issues per nursing  CrCl: 42 ml/min  Goal of Therapy: Heparin level 0.3-0.7 units/ml Monitor platelets by anticoagulation protocol: Yes  Plan:  Hold off on starting heparin until INR < 2  Daily INR until heparin start  Vitamin K if needed for urgent reversal  Monitor for signs of bleeding or thrombosis   Dian Situ  Lyliana Dicenso, PharmD Pager: [phone] 07/14/2016, 9:22 AM

## 2016-07-15 ENCOUNTER — Other Ambulatory Visit: Payer: Medicare Other

## 2016-07-15 DIAGNOSIS — I482 Chronic atrial fibrillation: Secondary | ICD-10-CM | POA: Diagnosis not present

## 2016-07-15 DIAGNOSIS — T148 Other injury of unspecified body region: Secondary | ICD-10-CM | POA: Diagnosis not present

## 2016-07-15 DIAGNOSIS — N390 Urinary tract infection, site not specified: Secondary | ICD-10-CM

## 2016-07-15 DIAGNOSIS — I10 Essential (primary) hypertension: Secondary | ICD-10-CM | POA: Diagnosis not present

## 2016-07-15 LAB — PROTIME-INR
INR: 1.42
Prothrombin Time: 17.4 seconds — ABNORMAL HIGH (ref 11.4–15.2)

## 2016-07-15 LAB — BASIC METABOLIC PANEL
Anion gap: 6 (ref 5–15)
BUN: 16 mg/dL (ref 6–20)
CHLORIDE: 105 mmol/L (ref 101–111)
CO2: 25 mmol/L (ref 22–32)
Calcium: 7.6 mg/dL — ABNORMAL LOW (ref 8.9–10.3)
Creatinine, Ser: 0.77 mg/dL (ref 0.44–1.00)
GFR calc Af Amer: >60 mL/min (ref >60)
GFR calc non Af Amer: >60 mL/min (ref >60)
GLUCOSE: 103 mg/dL — AB (ref 65–99)
POTASSIUM: 3.8 mmol/L (ref 3.5–5.1)
SODIUM: 136 mmol/L (ref 135–145)

## 2016-07-15 LAB — CBC
HEMATOCRIT: 36.9 % (ref 36.0–46.0)
Hemoglobin: 11.7 g/dL — ABNORMAL LOW (ref 12.0–15.0)
MCH: 26.8 pg (ref 26.0–34.0)
MCHC: 31.7 g/dL (ref 30.0–36.0)
MCV: 84.6 fL (ref 78.0–100.0)
Platelets: 191 10*3/uL (ref 150–400)
RBC: 4.36 MIL/uL (ref 3.87–5.11)
RDW: 16 % — AB (ref 11.5–15.5)
WBC: 5.9 10*3/uL (ref 4.0–10.5)

## 2016-07-15 MED ORDER — WARFARIN SODIUM 3 MG PO TABS
3.0000 mg | ORAL_TABLET | Freq: Once | ORAL | Status: AC
  Administered 2016-07-15: 3 mg via ORAL
  Filled 2016-07-15: qty 1

## 2016-07-15 MED ORDER — WARFARIN - PHARMACIST DOSING INPATIENT: Freq: Every day | Status: DC

## 2016-07-15 NOTE — Progress Notes (Signed)
Patient ID: CAMILLE ANTAR, female   DOB: [birth year], 82 y.o.   MRN: [id number]  PROGRESS NOTE    MILIANA DOZOIS  [id number] DOB: Jun 01, 1923 DOA: 07/14/2016  PCP: Irven Shelling, MD   Brief Narrative:  81 y.o. female with medical history significant for A. fib on Coumadin, tachybradycardia syndrome status post pacemaker 2011, hypertension, hyperlipidemia who presented to [facility] with intractable back pain. Initial evaluation revealed L2 compression fracture, urinary tract infection.  Assessment & Plan:   Principal Problem:   Compression fracture - Pt prefers conservative treatment, pain control, no surgeries or procedures  Active Problems:   Essential hypertension, benign - Continue metoprolol    ATRIAL FIBRILLATION - CHADS vasc score 4 - On anticoagulation with Coumadin - Heart rate controlled with amiodarone and metoprolol    UTI (urinary tract infection) / bladder pressure - Secondary to gram-negative rods - Follow up urine culture results - Continue Rocephin - Obtain bladder scan  DVT prophylaxis: On Coumadin Code Status: full code  Family Communication: Husband at bedside Disposition Plan: Home likely next 24 hours   Consultants:   IR  Procedures:   None  Antimicrobials:   Rocephin   Subjective: Feels more pressure in the bladder this morning.  Subjective: Vitals:   07/15/16 0210 07/15/16 0458 07/15/16 0803 07/15/16 1011  BP: (!) 179/82 (!) 166/73  (!) 128/56  Pulse: 71 70  72  Resp: 16 16  18   Temp: 98.1 F (36.7 C) 98.1 F (36.7 C)  97.8 F (36.6 C)  TempSrc: Oral Oral  Oral  SpO2: 95% 96%  98%  Weight:   59.4 kg (131 lb)   Height:        Intake/Output Summary (Last 24 hours) at 07/15/16 1243 Last data filed at 07/15/16 1015  Gross per 24 hour  Intake          1824.75 ml  Output             1150 ml  Net           674.75 ml   Filed Weights   07/14/16 0222 07/14/16 0824 07/15/16 0803  Weight: 61.7 kg (136 lb) 58.5 kg (129 lb)  59.4 kg (131 lb)    Examination:  General exam: Appears calm and comfortable  Respiratory system: Clear to auscultation. Respiratory effort normal. Cardiovascular system: S1 & S2 heard, RRR. Gastrointestinal system: Abdomen is nondistended, soft and nontender. No organomegaly or masses felt. Normal bowel sounds heard. Central nervous system: Alert and oriented. No focal neurological deficits. Extremities: Symmetric 5 x 5 power. Skin: No rashes, lesions or ulcers Psychiatry: Judgement and insight appear normal. Mood & affect appropriate.   Data Reviewed: I have personally reviewed following labs and imaging studies  CBC:  Recent Labs Lab 07/14/16 0427 07/15/16 0421  WBC 6.6 5.9  NEUTROABS 4.3  --   HGB 13.1 11.7*  HCT 41.1 36.9  MCV 85.6 84.6  PLT 222 [id number]   Basic Metabolic Panel:  Recent Labs Lab 07/14/16 0427 07/15/16 0421  NA 134* 136  K 4.1 3.8  CL 101 105  CO2 26 25  GLUCOSE 110* 103*  BUN 19 16  CREATININE 0.74 0.77  CALCIUM 8.0* 7.6*   GFR: Estimated Creatinine Clearance: 42.9 mL/min (by C-G formula based on SCr of 0.8 mg/dL). Liver Function Tests: No results for input(s): AST, ALT, ALKPHOS, BILITOT, PROT, ALBUMIN in the last 168 hours. No results for input(s): LIPASE, AMYLASE in the last 168 hours. No results for input(s):  AMMONIA in the last 168 hours. Coagulation Profile:  Recent Labs Lab 07/14/16 0900 07/15/16 0421  INR 2.78 1.42   Cardiac Enzymes: No results for input(s): CKTOTAL, CKMB, CKMBINDEX, TROPONINI in the last 168 hours. BNP (last 3 results) No results for input(s): PROBNP in the last 8760 hours. HbA1C: No results for input(s): HGBA1C in the last 72 hours. CBG: No results for input(s): GLUCAP in the last 168 hours. Lipid Profile: No results for input(s): CHOL, HDL, LDLCALC, TRIG, CHOLHDL, LDLDIRECT in the last 72 hours. Thyroid Function Tests: No results for input(s): TSH, T4TOTAL, FREET4, T3FREE, THYROIDAB in the last 72  hours. Anemia Panel: No results for input(s): VITAMINB12, FOLATE, FERRITIN, TIBC, IRON, RETICCTPCT in the last 72 hours. Urine analysis:    Component Value Date/Time   COLORURINE YELLOW 07/14/2016 0309   APPEARANCEUR CLOUDY (A) 07/14/2016 0309   LABSPEC 1.025 07/14/2016 0309   PHURINE 6.0 07/14/2016 0309   GLUCOSEU NEGATIVE 07/14/2016 0309   HGBUR TRACE (A) 07/14/2016 0309   BILIRUBINUR NEGATIVE 07/14/2016 0309   KETONESUR NEGATIVE 07/14/2016 0309   PROTEINUR NEGATIVE 07/14/2016 0309   NITRITE POSITIVE (A) 07/14/2016 0309   LEUKOCYTESUR SMALL (A) 07/14/2016 0309   Sepsis Labs: @LABRCNTIP (procalcitonin:4,lacticidven:4)   Recent Results (from the past 240 hour(s))  Urine culture     Status: Abnormal (Preliminary result)   Collection Time: 07/14/16  3:09 AM  Result Value Ref Range Status   Specimen Description URINE, CLEAN CATCH  Final   Special Requests NONE  Final   Culture >=100,000 COLONIES/mL GRAM NEGATIVE RODS (A)  Final   Report Status PENDING  Incomplete      Radiology Studies: Ct Lumbar Spine Wo Contrast Result Date: 07/14/2016 1. L2 moderate compression deformity involving superior and inferior endplates, age indeterminate. 2. Severe bone demineralization. 3. Moderate lumbar spondylosis as described. Electronically Signed   By: Kristine Garbe M.D.   On: 07/14/2016 04:43     Scheduled Meds: . amiodarone  200 mg Oral Daily  . cefTRIAXone (ROCEPHIN)  IV  1 g Intravenous Q24H  . metoprolol succinate  100 mg Oral BID  . phenazopyridine  100 mg Oral TID WC  . Warfarin - Pharmacist Dosing Inpatient   Does not apply q1800   Continuous Infusions:    LOS: 0 days    Time spent: 15 minutes  Greater than 50% of the time spent on counseling and coordinating the care.   Leisa Lenz, MD [facility] Pager [phone]  If 7PM-7AM, please contact night-coverage [email or web address] Password [facility] 07/15/2016, 12:43 PM

## 2016-07-15 NOTE — Progress Notes (Signed)
Bladder scanned performed several times with no urine noted in bladder. Patient has frequent urination of  125ml-150ml per incident.

## 2016-07-15 NOTE — Care Management Obs Status (Signed)
[place] NOTIFICATION   Patient Details  Name: Anne Macias MRN: [id number] Date of Birth: Mar 23, 1923   Medicare Observation Status Notification Given:  Yes    Lynnell Catalan, RN 07/15/2016, 11:56 AM

## 2016-07-15 NOTE — Progress Notes (Signed)
ANTICOAGULATION CONSULT NOTE - Initial Consult  Pharmacy Consult for Warfarin Indication: Hx atrial fibrillation  No Known Allergies  Patient Measurements: Height: 5\' 6"  (167.6 cm) Weight: 131 lb (59.4 kg) IBW/kg (Calculated) : 59.3 Heparin Dosing Weight:   Vital Signs: Temp: 98.1 F (36.7 C) (08/16 0458) Temp Source: Oral (08/16 0458) BP: 166/73 (08/16 0458) Pulse Rate: 70 (08/16 0458)  Labs:  Recent Labs  07/14/16 0427 07/14/16 0900 07/15/16 0421  HGB 13.1  --  11.7*  HCT 41.1  --  36.9  PLT 222  --  191  APTT  --  38*  --   LABPROT  --  29.9* 17.4*  INR  --  2.78 1.42  CREATININE 0.74  --  0.77    Estimated Creatinine Clearance: 42.9 mL/min (by C-G formula based on SCr of 0.8 mg/dL).   Medical History: Past Medical History:  Diagnosis Date  . Chronic anticoagulation   . Edema of lower extremity   . Hyperlipidemia   . Hypertension   . PAF (paroxysmal atrial fibrillation) ([place]) 1998  . S/P cardiac pacemaker procedure Sept [id number]   complicated by pocket hematoma  . Tachy-brady syndrome ([place])   . Tremor 05/08/2014  . Visual disturbance     Medications:  PTA warfarin 2mg  daily except 1mg  on Wed/Saturday, LD 8/14  Assessment: 89 yoF on chronic warfarin for hx AFib admitted with L2 fracture.  Initial plan was for kyphoplasty and warfarin reversed with Vit K with plan to start IV heparin when INR < 1.5.  Now pt and family have decided on conservative treatment and warfarin will be resumed today.  No bridge per MD.   8/16:  INR subtherapeutic @ 1.42 (goal 2-3) Vit K 2mg  IV given on 8/15 No issues with CBC.  No bleeding documented.  Note pt also on chronic amiodarone and inpt PRN toradol  Goal of Therapy:  INR 2-3 Monitor platelets by anticoagulation protocol: Yes   Plan:  Resume warfarin dosing today Warfarin 3mg  po x 1 at noon Daily PT/INR, CBC q72h   Ralene Bathe, PharmD, BCPS 07/15/2016, 9:40 AM  Pager: [phone]

## 2016-07-15 NOTE — Care Management Note (Signed)
Case Management Note  Patient Details  Name: [name] MRN: [id number] Date of Birth: 06/10/1923  Subjective/Objective:      82 yo admitted with Compression fracture.              Action/Plan: From home with spouse. Pt has RW and tub seat at home currently. Pt states she has used [place] ([facility]) previously and would like to use them again. [facility] [employer] rep contacted for referral. No other CM needs noted at this time. CM will continue to follow.  Expected Discharge Date:   (unknown)               Expected Discharge Plan:  [facility]  In-House Referral:     Discharge planning Services  CM Consult  Post Acute Care Choice:  Home Health Choice offered to:  Patient, Spouse  DME Arranged:    DME Agency:     HH Arranged:  PT [place]:  [place] (now [facility])  Status of Service:  In process, will continue to follow  If discussed at Long Length of Stay Meetings, dates discussed:    Additional CommentsLynnell Catalan, RN 07/15/2016, 1:29 PM [phone]

## 2016-07-15 NOTE — Progress Notes (Signed)
Patient ID: Anne Macias, female   DOB: [birth year], 82 y.o.   MRN: [id number] Pt currently prefers to treat back pain conservatively and does not wish to pursue VP/KP now. Please call us at [phone] if status changes.

## 2016-07-15 NOTE — Evaluation (Signed)
Physical Therapy Evaluation Patient Details Name: Anne Macias MRN: [id number] DOB: Apr 04, 1923 Today's Date: 07/15/2016   History of Present Illness  Anne Macias is a delightful 82 y.o. female with medical history significant for A. fib on Coumadin, tachybradycardia syndrome status post pacemaker 2011, hypertension, hyperlipidemia presents to the emergency Department chief complaint of intractable back pain. Initial evaluation reveals L2 compression fracture, urinary tract infection.  Clinical Impression  Pt admitted with above diagnosis. Pt currently with functional limitations due to the deficits listed below (see PT Problem List).  Pt will benefit from skilled PT to increase their independence and safety with mobility to allow discharge to the venue listed below.  Pt and husband educated on proper body mechanics and issued handout, but could use some reinforcement.  Verbally reviewed stairs, but did not physically perform today.     Follow Up Recommendations Home health PT;Supervision for mobility/OOB    Equipment Recommendations  None recommended by PT    Recommendations for Other Services       Precautions / Restrictions Precautions Precautions: Fall;Back Precaution Booklet Issued: Yes (comment)      Mobility  Bed Mobility Overal bed mobility: Needs Assistance Bed Mobility: Rolling;Sidelying to Sit Rolling: Min guard Sidelying to sit: Min assist       General bed mobility comments: Educated on proper body mechanics.  Pt and husband seemed knowledgeable, but needed cues for proper form  Transfers Overall transfer level: Needs assistance Equipment used: Rolling walker (2 wheeled) Transfers: Sit to/from Stand Sit to Stand: Min guard         General transfer comment: slow transition to achieve upright posture, but no physical A.  Ambulation/Gait Ambulation/Gait assistance: Min guard Ambulation Distance (Feet): 110 Feet Assistive device: Rolling walker (2  wheeled) Gait Pattern/deviations: Step-through pattern     General Gait Details: Decreased gait speed with step through pattern and no c/o pain, but did not want to attempt farther ambulation.  Stairs Stairs:  (verbally reviewed side ways technique which she reports she does anyway)          Wheelchair Mobility    Modified Rankin (Stroke Patients Only)       Balance Overall balance assessment: Needs assistance Sitting-balance support: Feet supported Sitting balance-Leahy Scale: Good       Standing balance-Leahy Scale: Poor Standing balance comment: requires UE support at this time                             Pertinent Vitals/Pain Pain Assessment: No/denies pain    Home Living Family/patient expects to be discharged to:: Private residence Living Arrangements: Spouse/significant other Available Help at Discharge: Family;Available 24 hours/day Type of Home: House Home Access: Stairs to enter Entrance Stairs-Rails: Left Entrance Stairs-Number of Steps: 4 Home Layout: Two level;Able to live on main level with bedroom/bathroom Home Equipment: Grab bars - toilet;Grab bars - tub/shower;Shower seat - built in;Walker - standard;Cane - single point (states bench is too low to sit on in shower) Additional Comments: reports that SW has gliders on it so it does slide like a RW    Prior Function Level of Independence: Independent               Hand Dominance   Dominant Hand: Right    Extremity/Trunk Assessment   Upper Extremity Assessment: Overall WFL for tasks assessed           Lower Extremity Assessment: Overall WFL for tasks assessed  Cervical / Trunk Assessment: Normal  Communication   Communication: No difficulties  Cognition Arousal/Alertness: Awake/alert Behavior During Therapy: WFL for tasks assessed/performed Overall Cognitive Status: Within Functional Limits for tasks assessed                      General Comments  General comments (skin integrity, edema, etc.): Supportive husband of 71 years    Exercises        Assessment/Plan    PT Assessment Patient needs continued PT services  PT Diagnosis Difficulty walking;Generalized weakness   PT Problem List Decreased mobility;Decreased knowledge of precautions;Decreased balance;Decreased activity tolerance;Pain  PT Treatment Interventions DME instruction;Gait training;Stair training;Functional mobility training;Therapeutic exercise;Therapeutic activities;Balance training;Patient/family education   PT Goals (Current goals can be found in the Care Plan section) Acute Rehab PT Goals Patient Stated Goal: to go home PT Goal Formulation: With patient/family Time For Goal Achievement: 07/22/16 Potential to Achieve Goals: Good    Frequency Min 5X/week   Barriers to discharge        Co-evaluation               End of Session Equipment Utilized During Treatment: Gait belt Activity Tolerance: Patient tolerated treatment well Patient left: in chair;with call bell/phone within reach;with chair alarm set;with family/visitor present Nurse Communication: Mobility status;Other (comment) (Recommended ambulating with pt later today)    Functional Assessment Tool Used: clinical judgement and objective findings. Functional Limitation: Mobility: Walking and moving around Mobility: Walking and Moving Around Current Status [phone]): At least 1 percent but less than 20 percent impaired, limited or restricted Mobility: Walking and Moving Around Goal Status [phone]): At least 1 percent but less than 20 percent impaired, limited or restricted    Time: 1017-1049 PT Time Calculation (min) (ACUTE ONLY): 32 min   Charges:   PT Evaluation $PT Eval Moderate Complexity: 1 Procedure PT Treatments $Gait Training: 8-22 mins   PT G Codes:   PT G-Codes **NOT FOR INPATIENT CLASS** Functional Assessment Tool Used: clinical judgement and objective findings. Functional  Limitation: Mobility: Walking and moving around Mobility: Walking and Moving Around Current Status [phone]): At least 1 percent but less than 20 percent impaired, limited or restricted Mobility: Walking and Moving Around Goal Status [phone]): At least 1 percent but less than 20 percent impaired, limited or restricted    [facility] LUBECK 07/15/2016, 11:07 AM

## 2016-07-16 DIAGNOSIS — T148 Other injury of unspecified body region: Secondary | ICD-10-CM | POA: Diagnosis not present

## 2016-07-16 DIAGNOSIS — I10 Essential (primary) hypertension: Secondary | ICD-10-CM | POA: Diagnosis not present

## 2016-07-16 DIAGNOSIS — I482 Chronic atrial fibrillation: Secondary | ICD-10-CM | POA: Diagnosis not present

## 2016-07-16 DIAGNOSIS — N390 Urinary tract infection, site not specified: Secondary | ICD-10-CM | POA: Diagnosis not present

## 2016-07-16 LAB — URINE CULTURE: Culture: >=100000 — AB

## 2016-07-16 LAB — PROTIME-INR
INR: 1.26
PROTHROMBIN TIME: 15.8 s — AB (ref 11.4–15.2)

## 2016-07-16 MED ORDER — WARFARIN SODIUM 3 MG PO TABS
3.0000 mg | ORAL_TABLET | Freq: Once | ORAL | Status: AC
  Administered 2016-07-16: 3 mg via ORAL
  Filled 2016-07-16: qty 1

## 2016-07-16 MED ORDER — CIPROFLOXACIN HCL 500 MG PO TABS: 500.0000 mg | ORAL_TABLET | Freq: Two times a day (BID) | ORAL | 0 refills | Status: DC

## 2016-07-16 MED ORDER — ACETAMINOPHEN 325 MG PO TABS: 650.0000 mg | ORAL_TABLET | Freq: Four times a day (QID) | ORAL | 0 refills | Status: DC | PRN

## 2016-07-16 NOTE — Progress Notes (Signed)
[employer] rep alerted of [facility] order for INR draw. Orders for rolling walker and 3in1. [place] [employer] rep contacted for DME. No other CM needs communicated. Marney Doctor RN,BSN,NCM [phone]

## 2016-07-16 NOTE — Progress Notes (Signed)
ANTICOAGULATION CONSULT NOTE - Initial Consult  Pharmacy Consult for Warfarin Indication: Hx atrial fibrillation  No Known Allergies  Patient Measurements: Height: 5\' 6"  (167.6 cm) Weight: 131 lb (59.4 kg) IBW/kg (Calculated) : 59.3 Heparin Dosing Weight:   Vital Signs: Temp: 98.2 F (36.8 C) (08/17 0522) Temp Source: Oral (08/17 0522) BP: 160/74 (08/17 0522) Pulse Rate: 70 (08/17 0522)  Labs:  Recent Labs  07/14/16 0427 07/14/16 0900 07/15/16 0421 07/16/16 0410  HGB 13.1  --  11.7*  --   HCT 41.1  --  36.9  --   PLT 222  --  191  --   APTT  --  38*  --   --   LABPROT  --  29.9* 17.4* 15.8*  INR  --  2.78 1.42 1.26  CREATININE 0.74  --  0.77  --     Estimated Creatinine Clearance: 42.9 mL/min (by C-G formula based on SCr of 0.8 mg/dL).   Medical History: Past Medical History:  Diagnosis Date  . Chronic anticoagulation   . Edema of lower extremity   . Hyperlipidemia   . Hypertension   . PAF (paroxysmal atrial fibrillation) ([place]) 1998  . S/P cardiac pacemaker procedure Sept [id number]   complicated by pocket hematoma  . Tachy-brady syndrome ([place])   . Tremor 05/08/2014  . Visual disturbance     Medications:  PTA warfarin 2mg  daily except 1mg  on Wed/Saturday, LD 8/14  Assessment: 89 yoF on chronic warfarin for hx AFib admitted with L2 fracture.  Initial plan was for kyphoplasty and warfarin reversed with Vit K with plan to start IV heparin when INR < 1.5.  Now pt and family have decided on conservative treatment and warfarin resumed 8/16.  No bridge per MD.   8/17:  INR subtherapeutic @ 1.26 (goal 2-3) Vit K 2mg  IV given on 8/15 No issues with CBC.  No bleeding documented.  Note pt also on chronic amiodarone and inpt PRN toradol  Goal of Therapy:  INR 2-3 Monitor platelets by anticoagulation protocol: Yes   Plan:  Warfarin 3mg  po x 1 at noon Daily PT/INR, CBC q72h   Dolly Rias RPh 07/16/2016, 9:30 AM Pager [phone]

## 2016-07-16 NOTE — Discharge Summary (Signed)
Physician Discharge Summary  Anne Macias [id number] DOB: [birth year] DOA: 07/14/2016  PCP: Irven Shelling, MD  Admit date: 07/14/2016 Discharge date: 07/16/2016  Recommendations for Outpatient Follow-up:  Continue coumadin per home dosing. HH order placed on discharge and asked to check INR 07/20/16 and please report result to PCP Continue cipro for 5 days on discharge for UTI.  Discharge Diagnoses:  Principal Problem:   Compression fracture Active Problems:   Essential hypertension, benign   ATRIAL FIBRILLATION   Pyelonephritis   UTI (urinary tract infection)    Discharge Condition: stable   Diet recommendation: as tolerated   History of present illness:  82 y.o.femalewith medical history significant for A. fib on Coumadin, tachybradycardia syndrome status post pacemaker 2011, hypertension, hyperlipidemia who presented to [facility] with intractable back pain. Initial evaluation revealed L2 compression fracture, urinary tract infection.  Hospital Course:    Assessment & Plan:   Principal Problem:   Compression fracture - Pt prefers conservative treatment, pain control, no surgeries or procedures  Active Problems:   Essential hypertension, benign - Continue metoprolol    ATRIAL FIBRILLATION - CHADS vasc score 4 - On anticoagulation with Coumadin - Heart rate controlled with amiodarone and metoprolol    UTI (urinary tract infection) secondary to Klebsiella pneumoniae  / bladder pressure - Continue Rocephin through today and cipro on discharge for 5 days  - Bladder scan - no urine noted - Urinary stream improved   DVT prophylaxis: On Coumadin Code Status: full code  Family Communication: Husband at bedside    Consultants:   IR  Procedures:   None  Antimicrobials:   Rocephin   Signed:  Leisa Lenz, MD  [facility] 07/16/2016, 11:05 AM  Pager #: [phone]  Time spent in minutes: less than 30 minutes    Discharge  Exam: Vitals:   07/16/16 0017 07/16/16 0522  BP:  (!) 160/74  Pulse:  70  Resp: (!) 1 16  Temp:  98.2 F (36.8 C)   Vitals:   07/15/16 1400 07/15/16 2103 07/16/16 0017 07/16/16 0522  BP: (!) 154/58 (!) 129/51  (!) 160/74  Pulse: 73 70  70  Resp: 18 20 (!) 1 16  Temp: 98.9 F (37.2 C) 97.8 F (36.6 C)  98.2 F (36.8 C)  TempSrc: Oral Oral  Oral  SpO2: 99% 98%  98%  Weight:      Height:        General: Pt is alert, follows commands appropriately, not in acute distress Cardiovascular: Regular rate and rhythm, S1/S2 +, no murmurs Respiratory: Clear to auscultation bilaterally, no wheezing, no crackles, no rhonchi Abdominal: Soft, non tender, non distended, bowel sounds +, no guarding Extremities: no edema, no cyanosis, pulses palpable bilaterally DP and PT Neuro: Grossly nonfocal  Discharge Instructions  Discharge Instructions    Call MD for:  difficulty breathing, headache or visual disturbances    Complete by:  As directed   Call MD for:  persistant dizziness or light-headedness    Complete by:  As directed   Call MD for:  persistant nausea and vomiting    Complete by:  As directed   Call MD for:  severe uncontrolled pain    Complete by:  As directed   Diet - low sodium heart healthy    Complete by:  As directed   Discharge instructions    Complete by:  As directed   Continue coumadin per home dosing. HH order placed on discharge and asked to check INR 07/20/16 and  please report result to PCP Continue cipro for 5 days on discharge for UTI.   Increase activity slowly    Complete by:  As directed       Medication List    TAKE these medications   acetaminophen 325 MG tablet Commonly known as:  TYLENOL Take 2 tablets (650 mg total) by mouth every 6 (six) hours as needed for mild pain (or Fever >/= 101).   acetaminophen-codeine 300-30 MG tablet Commonly known as:  TYLENOL #3 Take 0.5-1 tablets by mouth every 6 (six) hours as needed for moderate pain or severe pain.    amiodarone 200 MG tablet Commonly known as:  PACERONE Take 1 tablet (200 mg total) by mouth daily.   CALTRATE 600+D PO Take 2 tablets by mouth daily.   ciprofloxacin 500 MG tablet Commonly known as:  CIPRO Take 1 tablet (500 mg total) by mouth 2 (two) times daily.   furosemide 20 MG tablet Commonly known as:  LASIX Take 1 tablet (20 mg total) by mouth daily. For three days What changed:  how much to take  when to take this  reasons to take this  additional instructions   metoprolol succinate 100 MG 24 hr tablet Commonly known as:  TOPROL-XL TAKE 1 TABLET BY MOUTH TWICE A DAY   potassium chloride 10 MEQ tablet Commonly known as:  K-DUR,KLOR-CON Take 10 mEq by mouth as needed (swelling).   SYSTANE OP Place 1 drop into both eyes 2 (two) times daily.   traMADol-acetaminophen 37.5-325 MG tablet Commonly known as:  ULTRACET Take 2 tablets by mouth every 6 (six) hours as needed for moderate pain or severe pain.   warfarin 2 MG tablet Commonly known as:  COUMADIN Take 1-2 mg by mouth daily. Take 1mg  by mouth on Wednesday and Saturday. On all other days take 2 mg once daily.       Follow-up Information    Irven Shelling, MD. Schedule an appointment as soon as possible for a visit in 1 week(s).   Specialty:  Internal Medicine Contact information: [address] [place] [address] [phone]            The results of significant diagnostics from this hospitalization (including imaging, microbiology, ancillary and laboratory) are listed below for reference.    Significant Diagnostic Studies: Ct Lumbar Spine Wo Contrast  Result Date: 07/14/2016 CLINICAL DATA:  82 y/o F; history of compression deformity with worsening back pain. EXAM: CT LUMBAR SPINE WITHOUT CONTRAST TECHNIQUE: Multidetector CT imaging of the lumbar spine was performed without intravenous contrast administration. Multiplanar CT image reconstructions were also generated.  COMPARISON:  None. FINDINGS: Five lumbar type non-rib-bearing vertebral bodies are present. There is mild levocurvature with apex at L2-3. There is straightening of lumbar lordosis. Mild L5-S1 retrolisthesis. The bones are severely demineralized. There is a moderate compression deformity of L2 involving both superior and inferior endplates and is age indeterminate. There is minimal retropulsion of bony elements posteriorly with mild canal stenosis. There are multilevel disc bulges combined with ligamentum flavum and facet hypertrophy at the L3 through S1 levels resulting in mild-to-moderate canal stenosis and foraminal narrowing greatest at the left L5-S1 level where it is moderate. Severe calcific aortic atherosclerosis of the infrarenal abdominal aorta. No acute intra-abdominal process is identified within the field of view. IMPRESSION: 1. L2 moderate compression deformity involving superior and inferior endplates, age indeterminate. 2. Severe bone demineralization. 3. Moderate lumbar spondylosis as described. Electronically Signed   By: Mia Creek  [place] M.D.   On: 07/14/2016 04:43    Microbiology: Recent Results (from the past 240 hour(s))  Urine culture     Status: Abnormal   Collection Time: 07/14/16  3:09 AM  Result Value Ref Range Status   Specimen Description URINE, CLEAN CATCH  Final   Special Requests NONE  Final   Culture >=100,000 COLONIES/mL KLEBSIELLA PNEUMONIAE (A)  Final   Report Status 07/16/2016 FINAL  Final   Organism ID, Bacteria KLEBSIELLA PNEUMONIAE (A)  Final      Susceptibility   Klebsiella pneumoniae - MIC*    AMPICILLIN >=32 RESISTANT Resistant     CEFAZOLIN <=4 SENSITIVE Sensitive     CEFTRIAXONE <=1 SENSITIVE Sensitive     CIPROFLOXACIN <=0.25 SENSITIVE Sensitive     GENTAMICIN <=1 SENSITIVE Sensitive     IMIPENEM <=0.25 SENSITIVE Sensitive     NITROFURANTOIN 64 INTERMEDIATE Intermediate     TRIMETH/SULFA <=20 SENSITIVE Sensitive     AMPICILLIN/SULBACTAM 4  SENSITIVE Sensitive     PIP/TAZO <=4 SENSITIVE Sensitive     Extended ESBL NEGATIVE Sensitive     * >=100,000 COLONIES/mL KLEBSIELLA PNEUMONIAE     Labs: Basic Metabolic Panel:  Recent Labs Lab 07/14/16 0427 07/15/16 0421  NA 134* 136  K 4.1 3.8  CL 101 105  CO2 26 25  GLUCOSE 110* 103*  BUN 19 16  CREATININE 0.74 0.77  CALCIUM 8.0* 7.6*   Liver Function Tests: No results for input(s): AST, ALT, ALKPHOS, BILITOT, PROT, ALBUMIN in the last 168 hours. No results for input(s): LIPASE, AMYLASE in the last 168 hours. No results for input(s): AMMONIA in the last 168 hours. CBC:  Recent Labs Lab 07/14/16 0427 07/15/16 0421  WBC 6.6 5.9  NEUTROABS 4.3  --   HGB 13.1 11.7*  HCT 41.1 36.9  MCV 85.6 84.6  PLT 222 191   Cardiac Enzymes: No results for input(s): CKTOTAL, CKMB, CKMBINDEX, TROPONINI in the last 168 hours. BNP: BNP (last 3 results) No results for input(s): BNP in the last 8760 hours.  ProBNP (last 3 results) No results for input(s): PROBNP in the last 8760 hours.  CBG: No results for input(s): GLUCAP in the last 168 hours.

## 2016-07-16 NOTE — Progress Notes (Signed)
PT Cancellation Note  Patient Details Name: ANIZA BORSA MRN: [id number] DOB: 10/20/1923   Cancelled Treatment:    Reason Eval/Treat Not Completed: Patient going over d/c paperwork with nurse.  PT asked pt and husband if they had any other questions from yesterday's session and they said they didn't.  Briefly reviewed safe stair management.  No charge.   Charlie Seda LUBECK 07/16/2016, 11:58 AM

## 2016-07-16 NOTE — Progress Notes (Signed)
Pt discharged home with spouse in stable condition. Discharge instructions and scripts given. Pt and spouse verbalize understanding. Pt expressed desire for rolling walker and BSC. CM made aware.

## 2016-07-16 NOTE — Discharge Instructions (Addendum)
Warfarin tablets What is this medicine? WARFARIN (WAR far in) is an anticoagulant. It is used to treat or prevent clots in the veins, arteries, lungs, or heart. This medicine may be used for other purposes; ask your health care provider or pharmacist if you have questions. What should I tell my health care provider before I take this medicine? They need to know if you have any of these conditions: -alcoholism -anemia -bleeding disorders -cancer -diabetes -heart disease -high blood pressure -history of bleeding in the gastrointestinal tract -history of stroke or other brain injury or disease -kidney or liver disease -protein C deficiency -protein S deficiency -psychosis or dementia -recent injury, recent or planned surgery or procedure -an unusual or allergic reaction to warfarin, other medicines, foods, dyes, or preservatives -pregnant or trying to get pregnant -breast-feeding How should I use this medicine? Take this medicine by mouth with a glass of water. Follow the directions on the prescription label. You can take this medicine with or without food. Take your medicine at the same time each day. Do not take it more often than directed. Do not stop taking except on your doctor's advice. Stopping this medicine may increase your risk of a blood clot. Be sure to refill your prescription before you run out of medicine. If your doctor or healthcare professional calls to change your dose, write down the dose and any other instructions. Always read the dose and instructions back to him or her to make sure you understand them. Tell your doctor or healthcare professional what strength of tablets you have on hand. Ask how many tablets you should take to equal your new dose. Write the date on the new instructions and keep them near your medicine. If you are told to stop taking your medicine until your next blood test, call your doctor or healthcare professional if you do not hear anything within 24  hours of the test to find out your new dose or when to restart your prior dose. A special MedGuide will be given to you by the pharmacist with each prescription and refill. Be sure to read this information carefully each time. Talk to your pediatrician regarding the use of this medicine in children. Special care may be needed. Overdosage: If you think you have taken too much of this medicine contact a poison control center or emergency room at once. NOTE: This medicine is only for you. Do not share this medicine with others. What if I miss a dose? It is important not to miss a dose. If you miss a dose, call your healthcare provider. Take the dose as soon as possible on the same day. If it is almost time for your next dose, take only that dose. Do not take double or extra doses to make up for a missed dose. What may interact with this medicine? Do not take this medicine with any of the following medications: -agents that prevent or dissolve blood clots -aspirin or other salicylates -danshen -dextrothyroxine -mifepristone -St. John's Wort -red yeast rice This medicine may also interact with the following medications: -acetaminophen -agents that lower cholesterol -alcohol -allopurinol -amiodarone -antibiotics or medicines for treating bacterial, fungal or viral infections -azathioprine -barbiturate medicines for inducing sleep or treating seizures -certain medicines for diabetes -certain medicines for heart rhythm problems -certain medicines for high blood pressure -chloral hydrate -cisapride -disulfiram -female hormones, including contraceptive or birth control pills -general anesthetics -herbal or dietary products like garlic, ginkgo, ginseng, green tea, or kava kava -influenza virus vaccine -female  hormones -medicines for mental depression or psychosis -medicines for some types of cancer -medicines for stomach problems -methylphenidate -NSAIDs, medicines for pain and  inflammation, like ibuprofen or naproxen -propoxyphene -quinidine, quinine -raloxifene -seizure or epilepsy medicine like carbamazepine, phenytoin, and valproic acid -steroids like cortisone and prednisone -tamoxifen -thyroid medicine -tramadol -vitamin c, vitamin e, and vitamin K -zafirlukast -zileuton This list may not describe all possible interactions. Give your health care provider a list of all the medicines, herbs, non-prescription drugs, or dietary supplements you use. Also tell them if you smoke, drink alcohol, or use illegal drugs. Some items may interact with your medicine. What should I watch for while using this medicine? Visit your doctor or health care professional for regular checks on your progress. You will need to have a blood test called a PT/INR regularly. The PT/INR blood test is done to make sure you are getting the right dose of this medicine. It is important to not miss your appointment for the blood tests. When you first start taking this medicine, these tests are done often. Once the correct dose is determined and you take your medicine properly, these tests can be done less often. Notify your doctor or health care professional and seek emergency treatment if you develop breathing problems; changes in vision; chest pain; severe, sudden headache; pain, swelling, warmth in the leg; trouble speaking; sudden numbness or weakness of the face, arm or leg. These can be signs that your condition has gotten worse. While you are taking this medicine, carry an identification card with your name, the name and dose of medicine(s) being used, and the name and phone number of your doctor or health care professional or person to contact in an emergency. Do not start taking or stop taking any medicines or over-the-counter medicines except on the advice of your doctor or health care professional. You should discuss your diet with your doctor or health care professional. Do not make major  changes in your diet. Vitamin K can affect how well this medicine works. Many foods contain vitamin K. It is important to eat a consistent amount of foods with vitamin K. Other foods with vitamin K that you should eat in consistent amounts are asparagus, basil, beef or pork liver, black eyed peas, broccoli, brussel sprouts, cabbage, chickpeas, cucumber with peel, green onions, green tea, okra, parsley, peas, thyme, and green leafy vegetables like beet greens, collard greens, endive, kale, mustard greens, spinach, turnip greens, watercress, or certain lettuces like green leaf or romaine. This medicine can cause birth defects or bleeding in an unborn child. Women of childbearing age should use effective birth control while taking this medicine. If a woman becomes pregnant while taking this medicine, she should discuss the potential risks and her options with her health care professional. Avoid sports and activities that might cause injury while you are using this medicine. Severe falls or injuries can cause unseen bleeding. Be careful when using sharp tools or knives. Consider using an Copy. Take special care brushing or flossing your teeth. Report any injuries, bruising, or red spots on the skin to your doctor or health care professional. If you have an illness that causes vomiting, diarrhea, or fever for more than a few days, contact your doctor. Also check with your doctor if you are unable to eat for several days. These problems can change the effect of this medicine. Even after you stop taking this medicine, it takes several days before your body recovers its normal ability to clot  blood. Ask your doctor or health care professional how long you need to be careful. If you are going to have surgery or dental work, tell your doctor or health care professional that you have been taking this medicine. What side effects may I notice from receiving this medicine? Side effects that you should report to  your doctor or health care professional as soon as possible: -back pain -chills -dizziness -fever -heavy menstrual bleeding or vaginal bleeding -painful, blue, or purple toes -painful, prolonged erection -signs and symptoms of bleeding such as bloody or black, tarry stools; red or dark-brown urine; spitting up blood or brown material that looks like coffee grounds; red spots on the skin; unusual bruising or bleeding from the eye, gums, or nose-skin rash, itching or skin damage -stomach pain -unusually weak or tired -yellowing of skin or eyes Side effects that usually do not require medical attention (report to your doctor or health care professional if they continue or are bothersome): -diarrhea -hair loss This list may not describe all possible side effects. Call your doctor for medical advice about side effects. You may report side effects to FDA at 1-800-FDA-1088. Where should I keep my medicine? Keep out of the reach of children. Store at room temperature between 15 and 30 degrees C (59 and 86 degrees F). Protect from light. Throw away any unused medicine after the expiration date. Do not flush down the toilet. NOTE: This sheet is a summary. It may not cover all possible information. If you have questions about this medicine, talk to your doctor, pharmacist, or health care provider.    2016, [employer]. (2013-06-07 12:17:56)

## 2016-09-11 ENCOUNTER — Telehealth: Payer: Self-pay | Admitting: Internal Medicine

## 2016-09-11 NOTE — Telephone Encounter (Signed)
New Message  Pt voiced wanting to know why she was scheduled to see PA/APP and not MD-Klein.  Pt voiced her and her husband come together and they do not want to see Caryl Comes and want to know why.  Provided next available for MD-Klein 10/16/2016 11:15 am and pt declined due to her and husband comes together.  Please f/u

## 2016-09-11 NOTE — Telephone Encounter (Addendum)
No answer on home phone, no voicemail to leave message. Will try again later.

## 2016-09-13 NOTE — Progress Notes (Addendum)
Cardiology Office Note Date:  09/14/2016  Patient ID:  Anne Macias, Anne Macias 1923/11/22, MRN [id number] PCP:  Irven Shelling, MD  Cardiologist/Electrophysiologist: Dr. Caryl Comes    Chief Complaint: routine/planned visit, worried about color of her feet  History of Present Illness: Anne Macias is a 82 y.o. female with history of a recent hospital stay in August secondary to back pain/compression fracture treated conservatively at the patient's preference, persistent AF, HTN, HLD, tachy-brady w/PPM, comes to the office to be seen for Dr. Caryl Comes.   Last seen by Dr. Caryl Comes Sept 2016, with symptoms of palpitations and HF felt secondary to her AF and started on amio.   Last seen in the AF clinic in January, doing well at that time, asymptomatic from AF standpoint.   She is feeling well, denies any palpitations, doesn't feel like she has had AF in quite a while, no CP or SOB, no dizziness, near syncope or syncope.  She denies any bleeding or signs of bleeding.  She has chronic feet/ankle swelling, resolved upon waking, increases through the day, unchanged.  She is concerned about the appearance of her feet, she denies pain, itching, saw her PMD a week ago and started on Triamcinolone Cream she says for "something like athlete's foot" but wanted to make sure we agreed.   AFib hx: Persistent DCCV x3 Current on Amiodarone Intolerant of Flecainide and propafenone Warfarin  Device history: MDT dual chamber PPM, implanted 08/28/10, Dr. Doreatha Lew, SSSx  Past Medical History:  Diagnosis Date  . Chronic anticoagulation   . Edema of lower extremity   . Hyperlipidemia   . Hypertension   . PAF (paroxysmal atrial fibrillation) ([place]) 1998  . S/P cardiac pacemaker procedure Sept [id number]   complicated by pocket hematoma  . Tachy-brady syndrome ([place])   . Tremor 05/08/2014  . Visual disturbance     Past Surgical History:  Procedure Laterality Date  . CARDIOVERSION N/A 02/26/2014   Procedure:  CARDIOVERSION;  Surgeon: Deboraha Sprang, MD;  Location: [facility] ENDOSCOPY;  Service: Cardiovascular;  Laterality: N/A;  . CARDIOVERSION N/A 03/19/2014   Procedure: CARDIOVERSION;  Surgeon: Marleah Spark, MD;  Location: [facility] ENDOSCOPY;  Service: Cardiovascular;  Laterality: N/A;  . CARDIOVERSION N/A 06/17/2015   Procedure: CARDIOVERSION;  Surgeon: Edom Spark, MD;  Location: [place];  Service: Cardiovascular;  Laterality: N/A;  . CATARACT EXTRACTION    . CHOLECYSTECTOMY  2005  . INSERT / REPLACE / REMOVE PACEMAKER  08/28/2010   implantaton  . US ECHOCARDIOGRAPHY  04/10/2010   EF 55-60%    Current Outpatient Prescriptions  Medication Sig Dispense Refill  . amiodarone (PACERONE) 200 MG tablet Take 1 tablet (200 mg total) by mouth daily. 90 tablet 3  . Calcium Carbonate-Vitamin D (CALTRATE 600+D PO) Take 2 tablets by mouth daily.      . furosemide (LASIX) 40 MG tablet Take 1 tablet (40 mg total) by mouth daily. 30 tablet 6  . metoprolol succinate (TOPROL-XL) 100 MG 24 hr tablet TAKE 1 TABLET BY MOUTH TWICE A DAY 180 tablet 1  . Polyethyl Glycol-Propyl Glycol (SYSTANE OP) Place 1 drop into both eyes 2 (two) times daily.    . potassium chloride (K-DUR,KLOR-CON) 10 MEQ tablet Take 1 tablet (10 mEq total) by mouth daily. 30 tablet 6  . traMADol-acetaminophen (ULTRACET) 37.5-325 MG tablet Take 2 tablets by mouth every 6 (six) hours as needed for moderate pain or severe pain.   0  . warfarin (COUMADIN) 2 MG tablet Take 1-2 mg  by mouth daily. Take 1mg  by mouth on Wednesday and Saturday. On all other days take 2 mg once daily.  4   No current facility-administered medications for this visit.     Allergies:   Review of patient's allergies indicates no known allergies.   Social History:  The patient  reports that she has never smoked. She has never used smokeless tobacco. She reports that she drinks about 0.6 oz of alcohol per week . She reports that she does not use drugs.   Family History:   The patient's family history includes Alcohol abuse in her brother; Heart attack in her brother and mother; Pneumonia in her father.  ROS:  Please see the history of present illness.   All other systems are reviewed and otherwise negative.   PHYSICAL EXAM:  VS:  BP 112/74   Pulse 70   Ht 5\' 6"  (1.676 m)   Wt 132 lb (59.9 kg)   BMI 21.31 kg/m  BMI: Body mass index is 21.31 kg/m. Well nourished, well developed, in no acute distress  HEENT: normocephalic, atraumatic  Neck: no JVD, carotid bruits or masses Cardiac:  RRR; no significant murmurs, no rubs, or gallops Lungs:  clear to auscultation bilaterally, no wheezing, rhonchi or rales  Abd: soft, nontender MS: no deformity, age appropriate atrophy Ext: no edema  Skin: warm and dry, b/l feet have a fungal looking type rash particularly soles and in between toes, peeling/dry skin b/l soles Neuro:  No gross deficits appreciated Psych: euthymic mood, full affect  PPM site is stable, no tethering or discomfort   EKG:  Done today and reviewed by myself shows A paced, V sensed, RBBB PPM interrogation today: battery/lead status stable, 100%AP, <0.1% Vpaced  02/16/14: TTE Study Conclusion - Left ventricle: The cavity size was normal. Systolic function was normal. The estimated ejection fraction was in the range of 55% to 60%. Wall motion was normal; there were no regional wall motion abnormalities. - Mitral valve: Mild to moderate regurgitation. - Left atrium: The atrium was mildly dilated. - Right atrium: The atrium was mildly dilated. - Tricuspid valve: Mild-moderate regurgitation. - Pulmonary arteries: Systolic pressure was mildly to moderately increased. PA peak pressure: 1mm Hg (S).  Recent Labs: 12/24/2015: ALT 30; TSH 0.961 07/15/2016: BUN 16; Creatinine, Ser 0.77; Hemoglobin 11.7; Platelets 191; Potassium 3.8; Sodium 136  No results found for requested labs within last 8760 hours.   CrCl cannot be calculated  (Patient's most recent lab result is older than the maximum 21 days allowed.).   Wt Readings from Last 3 Encounters:  09/14/16 132 lb (59.9 kg)  07/15/16 131 lb (59.4 kg)  12/24/15 136 lb 12.8 oz (62.1 kg)     Other studies reviewed: Additional studies/records reviewed today include: summarized above  ASSESSMENT AND PLAN:  1. Persistent AFib     CHA2DS2Vasc is at least 4, on warfarin, monitored and managed with her PMD     amio      Fluid status appears stable, edema sounds of venous insuffciency, weight is down 4 lbs since last.     Dr. Caryl Comes saw her, and was told that she is actually only taking her Furosemide and potassium as needed, and not routinely, he recommended that she resume taking her lasix/K+ daily  2. PPM     stable device function  3. HTN     Stable  4. B/l foot rash     Appears dermatological, c/w her PMD treatment and f/u with her PMD or  dermatologist.   Disposition: will get TSH, LFTs today, she reports her PMD does her labs routinely, though will get her amio labs dine today.  F/u with 6 month in-clinic pacer check (pt preference) and Dr. Caryl Comes in 1 year, sooner if needed.  Current medicines are reviewed at length with the patient today.  The patient did not have any concerns regarding medicines.  Haywood Lasso, PA-C 09/14/2016 4:38 PM     [place] [address] [place] [address] [phone] (office)  [phone] (fax)

## 2016-09-14 ENCOUNTER — Encounter: Payer: Self-pay | Admitting: Physician Assistant

## 2016-09-14 ENCOUNTER — Ambulatory Visit (INDEPENDENT_AMBULATORY_CARE_PROVIDER_SITE_OTHER): Payer: Medicare Other | Admitting: Physician Assistant

## 2016-09-14 VITALS — BP 112/74 | HR 70 | Ht 66.0 in | Wt 132.0 lb

## 2016-09-14 DIAGNOSIS — I495 Sick sinus syndrome: Secondary | ICD-10-CM

## 2016-09-14 DIAGNOSIS — I10 Essential (primary) hypertension: Secondary | ICD-10-CM | POA: Diagnosis not present

## 2016-09-14 DIAGNOSIS — Z79899 Other long term (current) drug therapy: Secondary | ICD-10-CM

## 2016-09-14 DIAGNOSIS — I480 Paroxysmal atrial fibrillation: Secondary | ICD-10-CM

## 2016-09-14 LAB — TSH: TSH: 3.22 mIU/L

## 2016-09-14 MED ORDER — FUROSEMIDE 40 MG PO TABS: 40.0000 mg | ORAL_TABLET | Freq: Every day | ORAL | 6 refills | Status: DC

## 2016-09-14 MED ORDER — POTASSIUM CHLORIDE CRYS ER 10 MEQ PO TBCR: 10.0000 meq | EXTENDED_RELEASE_TABLET | Freq: Every day | ORAL | 6 refills | Status: DC

## 2016-09-14 NOTE — Telephone Encounter (Signed)
The patient is seeing Tommye Standard, PA this afternoon at 2:30 pm.

## 2016-09-14 NOTE — Patient Instructions (Addendum)
Medication Instructions:   START MAKING SURE YOUR TAKING LASIX EVERY DAY ALONG WITH YOUR POTASSIUM DAILY  If you need a refill on your cardiac medications before your next appointment, please call your pharmacy.  Labwork:. TSH AND LFT TODAY     Testing/Procedures: NONE ORDER TODAY    Follow-Up:  Your physician wants you to follow-up in: [place] will receive a reminder letter in the mail two months in advance. If you don't receive a letter, please call our office to schedule the follow-up appointment.  .Your physician wants you to follow-up in:  [place]  [place] will receive a reminder letter in the mail two months in advance. If you don't receive a letter, please call our office to schedule the follow-up appointment.      Any Other Special Instructions Will Be Listed Below (If Applicable).

## 2016-09-15 LAB — HEPATIC FUNCTION PANEL
ALK PHOS: 75 U/L (ref 33–130)
ALT: 41 U/L — ABNORMAL HIGH (ref 6–29)
AST: 39 U/L — AB (ref 10–35)
Albumin: 3.1 g/dL — ABNORMAL LOW (ref 3.6–5.1)
BILIRUBIN DIRECT: 0.1 mg/dL (ref <=0.2)
BILIRUBIN INDIRECT: 0.4 mg/dL (ref 0.2–1.2)
BILIRUBIN TOTAL: 0.5 mg/dL (ref 0.2–1.2)
Total Protein: 6.2 g/dL (ref 6.1–8.1)

## 2016-09-16 ENCOUNTER — Other Ambulatory Visit: Payer: Self-pay | Admitting: *Deleted

## 2016-09-16 DIAGNOSIS — R748 Abnormal levels of other serum enzymes: Secondary | ICD-10-CM

## 2016-10-07 ENCOUNTER — Other Ambulatory Visit: Payer: Self-pay | Admitting: Internal Medicine

## 2016-10-13 ENCOUNTER — Telehealth: Payer: Self-pay | Admitting: Physician Assistant

## 2016-10-13 NOTE — Telephone Encounter (Signed)
I spoke to patient and advised her Renee ordered repeat LFTs due to slight elevation in last result. She voiced understanding and agreed with plan.  I linked orders to lab appt.

## 2016-10-13 NOTE — Telephone Encounter (Signed)
Pt is wondering why she have labs that need to done

## 2016-10-16 ENCOUNTER — Other Ambulatory Visit: Payer: Medicare Other | Admitting: *Deleted

## 2016-10-16 DIAGNOSIS — R748 Abnormal levels of other serum enzymes: Secondary | ICD-10-CM

## 2016-10-16 LAB — HEPATIC FUNCTION PANEL
ALBUMIN: 2.9 g/dL — AB (ref 3.6–5.1)
ALK PHOS: 53 U/L (ref 33–130)
ALT: 36 U/L — ABNORMAL HIGH (ref 6–29)
AST: 36 U/L — AB (ref 10–35)
BILIRUBIN DIRECT: 0.1 mg/dL (ref <=0.2)
BILIRUBIN INDIRECT: 0.6 mg/dL (ref 0.2–1.2)
BILIRUBIN TOTAL: 0.7 mg/dL (ref 0.2–1.2)
Total Protein: 5.6 g/dL — ABNORMAL LOW (ref 6.1–8.1)

## 2016-10-19 ENCOUNTER — Telehealth: Payer: Self-pay | Admitting: *Deleted

## 2016-10-19 NOTE — Telephone Encounter (Signed)
-----   Message from [facility], [place] sent at 10/16/2016  4:21 PM EST ----- Please let the patient know her liver enzyme result is stable, no changes.  Her protein though is bit low and let her know she need to make sure she is eating well/nutrtional healthy foods with adequate protein.  This could be contibuting to some of her swelling as well and see her PMD to discuss any specific nutritional guidelines indicated.  Please forward result to the PMD as well.  Thanks [employer]

## 2016-10-19 NOTE — Telephone Encounter (Signed)
SPOKE TO PATIENT ABOUT RESULTS AND EATING A HEALTHY DIET WITH ENOUGH PROTEIN IN ALONG WITH FOLLOWING UP WITH PMD ALSO AND PT NOTIFIED THE RESULTS WILL BE SENT TO PMD

## 2016-11-05 ENCOUNTER — Ambulatory Visit (INDEPENDENT_AMBULATORY_CARE_PROVIDER_SITE_OTHER): Payer: Medicare Other

## 2016-11-05 ENCOUNTER — Ambulatory Visit (INDEPENDENT_AMBULATORY_CARE_PROVIDER_SITE_OTHER): Payer: Medicare Other | Admitting: Specialist

## 2016-11-05 ENCOUNTER — Telehealth (INDEPENDENT_AMBULATORY_CARE_PROVIDER_SITE_OTHER): Payer: Self-pay | Admitting: Specialist

## 2016-11-05 ENCOUNTER — Encounter (INDEPENDENT_AMBULATORY_CARE_PROVIDER_SITE_OTHER): Payer: Self-pay | Admitting: Specialist

## 2016-11-05 ENCOUNTER — Other Ambulatory Visit (INDEPENDENT_AMBULATORY_CARE_PROVIDER_SITE_OTHER): Payer: Self-pay | Admitting: Radiology

## 2016-11-05 VITALS — BP 179/85 | HR 72 | Ht 66.0 in | Wt 135.0 lb

## 2016-11-05 DIAGNOSIS — M545 Low back pain: Secondary | ICD-10-CM

## 2016-11-05 DIAGNOSIS — G8929 Other chronic pain: Secondary | ICD-10-CM | POA: Diagnosis not present

## 2016-11-05 DIAGNOSIS — M8000XS Age-related osteoporosis with current pathological fracture, unspecified site, sequela: Secondary | ICD-10-CM

## 2016-11-05 DIAGNOSIS — S32020G Wedge compression fracture of second lumbar vertebra, subsequent encounter for fracture with delayed healing: Secondary | ICD-10-CM | POA: Diagnosis not present

## 2016-11-05 DIAGNOSIS — M48062 Spinal stenosis, lumbar region with neurogenic claudication: Secondary | ICD-10-CM

## 2016-11-05 DIAGNOSIS — S32020S Wedge compression fracture of second lumbar vertebra, sequela: Secondary | ICD-10-CM

## 2016-11-05 DIAGNOSIS — M4316 Spondylolisthesis, lumbar region: Secondary | ICD-10-CM | POA: Diagnosis not present

## 2016-11-05 MED ORDER — CALCITONIN (SALMON) 200 UNIT/ACT NA SOLN: 1.0000 | Freq: Every day | NASAL | Status: DC

## 2016-11-05 NOTE — Patient Instructions (Addendum)
Avoid bending, stooping and avoid lifting weights greater than 10 lbs. Avoid prolong standing and walking. Avoid frequent bending and stooping  No lifting greater than 10 lbs. May use ice or moist heat for pain. Weight loss is of benefit. Handicap license is approved. CT scan of the lumbar spine will be ordered to reassess for spinal stenosis at L1-2 associated with worsening L2 compression deformity and an L1-2 spondylolisthesis Start miacalcin for osteoporosis

## 2016-11-05 NOTE — Telephone Encounter (Signed)
refaxed medication.

## 2016-11-05 NOTE — Progress Notes (Addendum)
Office Visit Note   Patient: Anne Macias           Date of Birth: 23-Mar-1923           MRN: [id number] Visit Date: 11/05/2016              Requested by: Anne Orn, MD [address] [place], [place] [address] PCP: Anne Shelling, MD   Assessment & Plan: Visit Diagnoses:  1. Chronic midline low back pain without sciatica   2. Spinal stenosis of lumbar region with neurogenic claudication   3. Closed compression fracture of L2 lumbar vertebra, with delayed healing, subsequent encounter   4. Spondylolisthesis, lumbar region   Results of CT Scan of Lumbar Spine 12/12 discussed with Anne Macias and she will be scheduled for ESI at the L1-2 level where she has moderate stenosis associated with deformity from a stable burst fracture 05/2016. She needs osteoporosis treated and stenosis  Treated. On anticoagulation and has a pacemaker.   Plan:Avoid bending, stooping and avoid lifting weights greater than 10 lbs. Avoid prolong standing and walking. Avoid frequent bending and stooping  No lifting greater than 10 lbs. May use ice or moist heat for pain. Weight loss is of benefit. Handicap license is approved. CT scan of the lumbar spine will be ordered to reassess for spinal stenosis at L1-2 associated with worsening L2 compression deformity and an L1-2 spondylolisthesis Start miacalcin for osteoporosis  Follow-Up Instructions: Return in about 4 weeks (around 12/03/2016).   Orders:  Orders Placed This Encounter  Procedures  . XR Lumbar Spine 2-3 Views   No orders of the defined types were placed in this encounter.     Procedures: No procedures performed   Clinical Data: No additional findings.   Subjective: Chief Complaint  Patient presents with  . Lower Back - Pain, Follow-up    Patient states she has had history of back issues, no previous surgeries. States she was doing therapy and one exercise they told her to do flared up her low back pain.  Denies any radicular pain, no numbness or tingling. Pain increases with standing in one position. States no pain with laying or sitting. States no leg weakness. Ct Lspine done on 06/2016. History of a compression fracture in 04/2016, she went into the hospital for this and required some therapy at home. Post hospitalization was in PT for 6 weeks. On the last day therapy seemed to worsened an old injury.Pain is intermittant. Right now there is no pain, but with bending and brushing teeth or standing at the kitchen sink increases the discomfort. Leaning on cart with grocery shopping but can do without. The moment she sits down the pain is improved and then worsens again with standing again. Does walk the whole distance with walking.Walking distance is decreased, not walking daily as she Had prior to her fall. Pain left upper buttock and SI area. No leg pain, numbness or weakness, no pain radiating into the leg. Bowel and Bladder function without abnormality, no incontinence.     Review of Systems  Constitutional: Positive for activity change and unexpected weight change. Negative for appetite change.  HENT: Positive for dental problem.   Eyes: Positive for visual disturbance.  Respiratory: Negative.   Cardiovascular: Negative.   Gastrointestinal: Negative.   Endocrine: Negative.   Genitourinary: Negative.   Musculoskeletal: Positive for back pain and gait problem.  Skin: Negative.   Hematological: Negative.   Psychiatric/Behavioral: Negative.      Objective:  Vital Signs: BP (!) 179/85   Pulse 72   Ht 5\' 6"  (1.676 m)   Wt 135 lb (61.2 kg)   BMI 21.79 kg/m   Physical Exam  Constitutional: She is oriented to person, place, and time. She appears well-developed and well-nourished.  HENT:  Head: Normocephalic and atraumatic.  Eyes: EOM are normal. Pupils are equal, round, and reactive to light.  Neck: Normal range of motion. Neck supple.  Pulmonary/Chest: Effort normal and breath sounds  normal.  Abdominal: Soft. Bowel sounds are normal.  Musculoskeletal: She exhibits no edema, tenderness or deformity.  Neurological: She is oriented to person, place, and time. She displays normal reflexes. No cranial nerve deficit or sensory deficit. She exhibits normal muscle tone. Coordination normal.  Skin: Skin is warm and dry.  Psychiatric: She has a normal mood and affect. Her behavior is normal. Judgment and thought content normal.    Back Exam   Tenderness  The patient is experiencing tenderness in the lumbar.  Range of Motion  Extension: abnormal  Flexion: normal  Lateral Bend Right: normal  Lateral Bend Left: normal  Rotation Right: normal  Rotation Left: normal   Muscle Strength  Right Quadriceps:  5/5  Left Quadriceps:  5/5  Right Hamstrings:  5/5  Left Hamstrings:  5/5   Tests  Straight leg raise right: negative Straight leg raise left: negative  Reflexes  Patellar: normal Achilles: normal Babinski's sign: normal   Other  Toe Walk: normal Heel Walk: normal Sensation: normal Gait: normal  Erythema: no back redness Scars: absent      Specialty Comments:  No specialty comments available.  Imaging: Xr Lumbar Spine 2-3 Views  Result Date: 11/05/2016 AP and Lateral Flexion andExtension radiographs show L2 cmpression fracture now 75% compared with CT of 8/15 that showed a superior endplate compression fracture at L2 with 20-25% compression anteriorly. There is an anterolisthesis of L1 onL2 on 4-6 mm which is also worsened Compared with CT Scan on 8/15. Generalized osteopenia which is severe. Recommend Bone density testing. Kyphosis is present at the L1-2 level.    PMFS History: Patient Active Problem List   Diagnosis Date Noted  . Chronic midline low back pain without sciatica 11/05/2016  . Spinal stenosis of lumbar region with neurogenic claudication 11/05/2016  . Spondylolisthesis, lumbar region 11/05/2016  . Pyelonephritis 07/14/2016  . Closed  compression fracture of L2 lumbar vertebra, with delayed healing, subsequent encounter 07/14/2016  . UTI (urinary tract infection) 07/14/2016  . Fall 06/20/2014  . Anticoagulated on Coumadin 06/20/2014  . Hyperlipidemia 06/20/2014  . Acute blood loss anemia 06/20/2014  . Pelvic fracture ([place]) 06/19/2014  . Tremor 05/08/2014  . Renal insufficiency 03/12/2014  . Anorexia 03/12/2014  . Sinus node dysfunction ([place]) 02/17/2012  . Essential hypertension, benign 12/09/2010  . ATRIAL FIBRILLATION 12/09/2010  . PACEMAKER, PERMANENT-Medtronic REVO 12/08/2010   Past Medical History:  Diagnosis Date  . Chronic anticoagulation   . Edema of lower extremity   . Hyperlipidemia   . Hypertension   . PAF (paroxysmal atrial fibrillation) ([place]) 1998  . S/P cardiac pacemaker procedure Sept [id number]   complicated by pocket hematoma  . Tachy-brady syndrome ([place])   . Tremor 05/08/2014  . Visual disturbance     Family History  Problem Relation Age of Onset  . Heart attack Mother   . Pneumonia Father   . Heart attack Brother   . Alcohol abuse Brother     Past Surgical History:  Procedure Laterality Date  . CARDIOVERSION  N/A 02/26/2014   Procedure: CARDIOVERSION;  Surgeon: Deboraha Sprang, MD;  Location: [facility] ENDOSCOPY;  Service: Cardiovascular;  Laterality: N/A;  . CARDIOVERSION N/A 03/19/2014   Procedure: CARDIOVERSION;  Surgeon: Jaydeen Spark, MD;  Location: [facility] ENDOSCOPY;  Service: Cardiovascular;  Laterality: N/A;  . CARDIOVERSION N/A 06/17/2015   Procedure: CARDIOVERSION;  Surgeon: Jennifr Spark, MD;  Location: [facility];  Service: Cardiovascular;  Laterality: N/A;  . CATARACT EXTRACTION    . CHOLECYSTECTOMY  2005  . INSERT / REPLACE / REMOVE PACEMAKER  08/28/2010   implantaton  . US ECHOCARDIOGRAPHY  04/10/2010   EF 55-60%   Social History   Occupational History  . Not on file.   Social History Main Topics  . Smoking status: Never Smoker  . Smokeless tobacco: Never Used  . Alcohol  use 0.6 oz/week    1 Glasses of wine per week     Comment: 5 oz red wine daily at dinner  . Drug use: No  . Sexual activity: Not on file

## 2016-11-05 NOTE — Telephone Encounter (Signed)
Patient's spouse Geophysicist/field seismologist) called advised he went to the pharmacy and the Rx for (nasal spray) was not called in yet. Mr. Leidecker asked for a call when Rx is called in. [facility] use [facility] on [address]. The number to contact him him is [phone]

## 2016-11-10 ENCOUNTER — Ambulatory Visit
Admission: RE | Admit: 2016-11-10 | Discharge: 2016-11-10 | Disposition: A | Discharge status: Home / Self Care | Payer: Medicare Other | Source: Ambulatory Visit | Attending: Specialist | Admitting: Specialist

## 2016-11-16 NOTE — Addendum Note (Signed)
Addended by: Basil Dess on: [id number] 07:20 PM   Modules accepted: Orders

## 2016-11-19 ENCOUNTER — Telehealth (INDEPENDENT_AMBULATORY_CARE_PROVIDER_SITE_OTHER): Payer: Self-pay | Admitting: Specialist

## 2016-11-19 NOTE — Telephone Encounter (Signed)
Believe this came to me by accident

## 2016-11-19 NOTE — Telephone Encounter (Signed)
Pt stated dr Louanne Skye wants to give pt steroid inj but only when she comes off coumadin. Pt calling to inquire about this? She doesn't know how long she needs to be off of it before we can schedule. Pt number is  [phone]

## 2016-11-19 NOTE — Telephone Encounter (Signed)
See below, injection with Dr. Ernestina Patches. Please advise on how long needs to be off coumadin.

## 2016-11-20 NOTE — Telephone Encounter (Signed)
I called patient back and advised that we are still waiting on the ok for her to come off of the Coumadin from Dr. Laurann Montana and I will call her once we get this back to schedule the appt

## 2016-12-03 ENCOUNTER — Telehealth (INDEPENDENT_AMBULATORY_CARE_PROVIDER_SITE_OTHER): Payer: Self-pay | Admitting: Specialist

## 2016-12-03 NOTE — Telephone Encounter (Signed)
Patient called asked for a call back concerning the injection she is having 12/10/16. Patient want to know are there any rate of risk having the injection.  The number to contact her is [phone]

## 2016-12-03 NOTE — Telephone Encounter (Signed)
Please advise patient.  

## 2016-12-04 NOTE — Telephone Encounter (Signed)
I called the patient and went over some of the risks of the injection with her and explained that Dr. Ernestina Patches would go over the risks and the rate of risk with her further before the injection. I advised her to call if she had any further questions.

## 2016-12-10 ENCOUNTER — Encounter (INDEPENDENT_AMBULATORY_CARE_PROVIDER_SITE_OTHER): Payer: Medicare Other | Admitting: Physical Medicine and Rehabilitation

## 2016-12-17 ENCOUNTER — Ambulatory Visit (INDEPENDENT_AMBULATORY_CARE_PROVIDER_SITE_OTHER): Payer: Medicare Other | Admitting: Specialist

## 2017-01-13 ENCOUNTER — Other Ambulatory Visit: Payer: Self-pay | Admitting: Internal Medicine

## 2017-01-19 ENCOUNTER — Encounter: Payer: Self-pay | Admitting: Podiatry

## 2017-01-19 ENCOUNTER — Ambulatory Visit (INDEPENDENT_AMBULATORY_CARE_PROVIDER_SITE_OTHER): Payer: Medicare Other | Admitting: Podiatry

## 2017-01-19 VITALS — BP 155/80 | HR 71 | Resp 16 | Ht 60.6 in | Wt 135.0 lb

## 2017-01-19 DIAGNOSIS — M79609 Pain in unspecified limb: Secondary | ICD-10-CM | POA: Diagnosis not present

## 2017-01-19 DIAGNOSIS — L603 Nail dystrophy: Secondary | ICD-10-CM

## 2017-01-19 DIAGNOSIS — B351 Tinea unguium: Secondary | ICD-10-CM | POA: Diagnosis not present

## 2017-01-19 NOTE — Progress Notes (Signed)
   Subjective:    Patient ID: Anne Macias, female    DOB: Oct 08, 1923, 83 y.o.   MRN: [id number]  HPI: She presents with her husband today with chief complaint of elongated painful toenails for the past 2-3 months  Review of Systems  Cardiovascular: Positive for leg swelling.  All other systems reviewed and are negative.      Objective:   Physical Exam: Vital signs are stable alert and oriented 3 pulses are palpable. Neurologic sensorium is intact. Deep tendon reflexes are intact. Muscle strength was 5 over 5 dorsiflexion plantar flexors and inverters everters on his musculature is intact. Orthopedic evaluation was resulted to assist with full range of motion without crepitation mild hammertoe deformities are noted. Cutaneous evaluation Mr. supple well-hydrated cutis toenails along thick yellow dystrophic onychomycotic painful palpation as well as debridement. No ulcerations no open wounds are noted.        Assessment & Plan:  Assessment: Pain and limp secondary to onychomycosis 1 through 5 bilateral.  Plan: Debridement of long painful toenails today all up with her as needed.

## 2017-01-20 ENCOUNTER — Ambulatory Visit: Payer: Medicare Other | Admitting: Podiatry

## 2017-01-22 ENCOUNTER — Encounter (INDEPENDENT_AMBULATORY_CARE_PROVIDER_SITE_OTHER): Payer: Self-pay | Admitting: Specialist

## 2017-01-22 ENCOUNTER — Ambulatory Visit (INDEPENDENT_AMBULATORY_CARE_PROVIDER_SITE_OTHER): Payer: Medicare Other | Admitting: Specialist

## 2017-01-22 ENCOUNTER — Ambulatory Visit (INDEPENDENT_AMBULATORY_CARE_PROVIDER_SITE_OTHER): Payer: Medicare Other

## 2017-01-22 VITALS — BP 184/89 | HR 73 | Ht 66.0 in | Wt 135.0 lb

## 2017-01-22 DIAGNOSIS — M48062 Spinal stenosis, lumbar region with neurogenic claudication: Secondary | ICD-10-CM | POA: Diagnosis not present

## 2017-01-22 DIAGNOSIS — M545 Low back pain, unspecified: Secondary | ICD-10-CM

## 2017-01-22 DIAGNOSIS — G8929 Other chronic pain: Secondary | ICD-10-CM | POA: Diagnosis not present

## 2017-01-22 DIAGNOSIS — S32020G Wedge compression fracture of second lumbar vertebra, subsequent encounter for fracture with delayed healing: Secondary | ICD-10-CM

## 2017-01-22 NOTE — Progress Notes (Signed)
Office Visit Note   Patient: Anne Macias           Date of Birth: 12-Sep-1923           MRN: [id number] Visit Date: 01/22/2017              Requested by: Lavone Orn, MD [address] [place], [place] [address] PCP: Irven Shelling, MD   Assessment & Plan: Visit Diagnoses:  1. Chronic midline low back pain without sciatica   2. Spinal stenosis of lumbar region with neurogenic claudication   3. Closed compression fracture of L2 lumbar vertebra, with delayed healing, subsequent encounter     Plan:Avoid frequent bending and stooping  No lifting greater than 10 lbs. May use ice or moist heat for pain. Weight loss is of benefit. Handicap license is approved. Schedule to see Dr. Estanislado Pandy for osteoporosis treatment and management.  Follow-Up Instructions: Return in about 3 months (around 04/21/2017).   Orders:  Orders Placed This Encounter  Procedures  . XR Lumbar Spine 2-3 Views  . Ambulatory referral to Rheumatology   No orders of the defined types were placed in this encounter.     Procedures: No procedures performed   Clinical Data: No additional findings.   Subjective: Chief Complaint  Patient presents with  . Lower Back - Follow-up    Anne Macias is here for follow up on low back pain.  She was referred to have injections with Dr. Ernestina Patches but did not have it done.  She says that she can do everything but stand.  She can not stand at all for any length of time.  She only has low back pain.    Review of Systems  Constitutional: Negative.   HENT: Negative.   Eyes: Negative.   Respiratory: Negative.   Cardiovascular: Negative.   Gastrointestinal: Negative.   Endocrine: Negative.   Genitourinary: Negative.   Musculoskeletal: Negative.   Skin: Negative.   Allergic/Immunologic: Negative.   Neurological: Negative.   Hematological: Negative.   Psychiatric/Behavioral: Negative.      Objective: Vital Signs: BP (!) 184/89 (BP  Location: Left Arm, Patient Position: Sitting)   Pulse 73   Ht 5\' 6"  (1.676 m)   Wt 135 lb (61.2 kg)   BMI 21.79 kg/m   Physical Exam  Constitutional: She is oriented to person, place, and time. She appears well-developed and well-nourished.  HENT:  Head: Normocephalic and atraumatic.  Eyes: EOM are normal. Pupils are equal, round, and reactive to light.  Neck: Normal range of motion. Neck supple.  Pulmonary/Chest: Effort normal and breath sounds normal.  Abdominal: Soft. Bowel sounds are normal.  Neurological: She is alert and oriented to person, place, and time.  Skin: Skin is warm and dry.  Psychiatric: She has a normal mood and affect. Her behavior is normal. Judgment and thought content normal.    Back Exam   Tenderness  The patient is experiencing tenderness in the lumbar.  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      Specialty Comments:  No specialty comments available.  Imaging: Xr Lumbar Spine 2-3 Views  Result Date: 01/22/2017 AP and lateral radiographs of the lumbar spine show compression fracture L2 with about 75% wedge compression deformity, not much change since her last radiographs from 10/2016. There in mild retropulsion of the superior and posterior vertebral body also unchanged, there does appear to be some bridging of bone across the L1-2 disc anteriorly consistent with early  autofusion    PMFS History: Patient Active Problem List   Diagnosis Date Noted  . Chronic midline low back pain without sciatica 11/05/2016  . Spinal stenosis of lumbar region with neurogenic claudication 11/05/2016  . Spondylolisthesis, lumbar region 11/05/2016  . Pyelonephritis 07/14/2016  . Closed compression fracture of L2 lumbar vertebra, with delayed healing, subsequent encounter 07/14/2016  . UTI (urinary tract infection) 07/14/2016  . Fall 06/20/2014  . Anticoagulated on Coumadin 06/20/2014  . Hyperlipidemia 06/20/2014  . Acute blood loss anemia 06/20/2014  . Pelvic fracture ([place]) 06/19/2014  . Tremor 05/08/2014  . Renal insufficiency 03/12/2014  . Anorexia 03/12/2014  . Sinus node dysfunction ([place]) 02/17/2012  . Essential hypertension, benign 12/09/2010  . ATRIAL FIBRILLATION 12/09/2010  . PACEMAKER, PERMANENT-Medtronic REVO 12/08/2010   Past Medical History:  Diagnosis Date  . Chronic anticoagulation   . Edema of lower extremity   . Hyperlipidemia   . Hypertension   . PAF (paroxysmal atrial fibrillation) ([place]) 1998  . S/P cardiac pacemaker procedure Sept [id number]   complicated by pocket hematoma  . Tachy-brady syndrome ([place])   . Tremor 05/08/2014  . Visual disturbance     Family History  Problem Relation Age of Onset  . Heart attack Mother   . Pneumonia Father   . Heart attack Brother   . Alcohol abuse Brother     Past Surgical History:  Procedure Laterality Date  . CARDIOVERSION N/A 02/26/2014   Procedure: CARDIOVERSION;  Surgeon: Deboraha Sprang, MD;  Location: [facility] ENDOSCOPY;  Service: Cardiovascular;  Laterality: N/A;  . CARDIOVERSION N/A 03/19/2014   Procedure: CARDIOVERSION;  Surgeon: Kemya Spark, MD;  Location: [facility] ENDOSCOPY;  Service: Cardiovascular;  Laterality: N/A;  . CARDIOVERSION N/A 06/17/2015   Procedure: CARDIOVERSION;  Surgeon: Saniya Spark, MD;  Location: [place];  Service: Cardiovascular;  Laterality: N/A;  . CATARACT EXTRACTION      . CHOLECYSTECTOMY  2005  . INSERT / REPLACE / REMOVE PACEMAKER  08/28/2010   implantaton  . US ECHOCARDIOGRAPHY  04/10/2010   EF 55-60%   Social History   Occupational History  . Not on file.   Social History Main Topics  . Smoking status: Never Smoker  . Smokeless tobacco: Never Used  . Alcohol use 0.6 oz/week    1 Glasses of wine per week     Comment: 5 oz red wine daily at dinner  . Drug use: No  . Sexual activity: Not on file

## 2017-01-22 NOTE — Patient Instructions (Addendum)
Avoid frequent bending and stooping  No lifting greater than 10 lbs. May use ice or moist heat for pain. Weight loss is of benefit. Handicap license is approved.  Schedule to see Dr. Estanislado Pandy for osteoporosis treatment and management.

## 2017-01-25 ENCOUNTER — Telehealth: Payer: Self-pay | Admitting: Rheumatology

## 2017-01-25 NOTE — Telephone Encounter (Signed)
Ok to schedule new patient appt for osteoporosis.

## 2017-01-25 NOTE — Telephone Encounter (Signed)
Patient called to check the status of her referral.  I see that we have received it but the referral has not been authorized as of yet.  Cb#[phone].  Thank you.

## 2017-03-06 NOTE — Progress Notes (Deleted)
Office Visit Note  Patient: Anne Macias             Date of Birth: July 18, 1923           MRN: [id number]             PCP: Irven Shelling, MD Referring: Jessy Oto, MD Visit Date: 03/10/2017 Occupation: @GUAROCC @    Subjective:  No chief complaint on file.   History of Present Illness: Anne Macias is a 83 y.o. female ***   Activities of Daily Living:  Patient reports morning stiffness for *** {minute/hour:19697}.   Patient {ACTIONS;DENIES/REPORTS:21021675::"Denies"} nocturnal pain.  Difficulty dressing/grooming: {ACTIONS;DENIES/REPORTS:21021675::"Denies"} Difficulty climbing stairs: {ACTIONS;DENIES/REPORTS:21021675::"Denies"} Difficulty getting out of chair: {ACTIONS;DENIES/REPORTS:21021675::"Denies"} Difficulty using hands for taps, buttons, cutlery, and/or writing: {ACTIONS;DENIES/REPORTS:21021675::"Denies"}   No Rheumatology ROS completed.   PMFS History:  Patient Active Problem List   Diagnosis Date Noted  . Chronic midline low back pain without sciatica 11/05/2016  . Spinal stenosis of lumbar region with neurogenic claudication 11/05/2016  . Spondylolisthesis, lumbar region 11/05/2016  . Pyelonephritis 07/14/2016  . Closed compression fracture of L2 lumbar vertebra, with delayed healing, subsequent encounter 07/14/2016  . UTI (urinary tract infection) 07/14/2016  . Fall 06/20/2014  . Anticoagulated on Coumadin 06/20/2014  . Hyperlipidemia 06/20/2014  . Acute blood loss anemia 06/20/2014  . Pelvic fracture ([place]) 06/19/2014  . Tremor 05/08/2014  . Renal insufficiency 03/12/2014  . Anorexia 03/12/2014  . Sinus node dysfunction ([place]) 02/17/2012  . Essential hypertension, benign 12/09/2010  . ATRIAL FIBRILLATION 12/09/2010  . PACEMAKER, PERMANENT-Medtronic REVO 12/08/2010    Past Medical History:  Diagnosis Date  . Chronic anticoagulation   . Edema of lower extremity   . Hyperlipidemia   . Hypertension   . PAF (paroxysmal atrial fibrillation)  ([place]) 1998  . S/P cardiac pacemaker procedure Sept 9622   complicated by pocket hematoma  . Tachy-brady syndrome ([place])   . Tremor 05/08/2014  . Visual disturbance     Family History  Problem Relation Age of Onset  . Heart attack Mother   . Pneumonia Father   . Heart attack Brother   . Alcohol abuse Brother    Past Surgical History:  Procedure Laterality Date  . CARDIOVERSION N/A 02/26/2014   Procedure: CARDIOVERSION;  Surgeon: Deboraha Sprang, MD;  Location: [facility] ENDOSCOPY;  Service: Cardiovascular;  Laterality: N/A;  . CARDIOVERSION N/A 03/19/2014   Procedure: CARDIOVERSION;  Surgeon: Yaelis Spark, MD;  Location: [facility] ENDOSCOPY;  Service: Cardiovascular;  Laterality: N/A;  . CARDIOVERSION N/A 06/17/2015   Procedure: CARDIOVERSION;  Surgeon: Angelys Spark, MD;  Location: [place];  Service: Cardiovascular;  Laterality: N/A;  . CATARACT EXTRACTION    . CHOLECYSTECTOMY  2005  . INSERT / REPLACE / REMOVE PACEMAKER  08/28/2010   implantaton  . US ECHOCARDIOGRAPHY  04/10/2010   EF 55-60%   Social History   Social History Narrative  . No narrative on file     Objective: Vital Signs: There were no vitals taken for this visit.   Physical Exam   Musculoskeletal Exam: ***  CDAI Exam: No CDAI exam completed.    Investigation: Findings:  07/15/2016 CBC hemoglobin 11.7, BMP calcium low at 7.6, 10/16/2016 AST 36, ALT 36, albumin 5.6, TSH normal    Imaging: No results found.  Speciality Comments: No specialty comments available.    Procedures:  No procedures performed Allergies: Patient has no known allergies.   Assessment / Plan:     Visit Diagnoses: Closed compression fracture  of L2 lumbar vertebra, with delayed healing, subsequent encounter - DEXA pending  Chronic midline low back pain without sciatica  Spinal stenosis of lumbar region with neurogenic claudication  Anticoagulated on Coumadin  Paroxysmal atrial fibrillation ([facility])  PACEMAKER,  PERMANENT-Medtronic REVO    Orders: No orders of the defined types were placed in this encounter.  No orders of the defined types were placed in this encounter.   Face-to-face time spent with patient was *** minutes. 50% of time was spent in counseling and coordination of care.  Follow-Up Instructions: No Follow-up on file.   Bo Merino, MD  Note - This record has been created using Editor, commissioning.  Chart creation errors have been sought, but may not always  have been located. Such creation errors do not reflect on  the standard of medical care.

## 2017-03-09 ENCOUNTER — Telehealth (INDEPENDENT_AMBULATORY_CARE_PROVIDER_SITE_OTHER): Payer: Self-pay

## 2017-03-09 NOTE — Telephone Encounter (Signed)
Patient called stating she has been calling to get appt with Dr Estanislado Pandy rescheduled for the last 2 days and needs someone to call her back about this ASAP [phone]

## 2017-03-10 ENCOUNTER — Ambulatory Visit: Payer: Medicare Other | Admitting: Rheumatology

## 2017-03-15 ENCOUNTER — Telehealth (INDEPENDENT_AMBULATORY_CARE_PROVIDER_SITE_OTHER): Payer: Self-pay | Admitting: Specialist

## 2017-03-15 NOTE — Telephone Encounter (Signed)
Pt called back asking for a call back. CB: [phone]

## 2017-03-15 NOTE — Telephone Encounter (Signed)
I called and spoke with patient and she wanted to make sure that we didn't need anymore info from her, I advised that I didn't think that we did but if we do that we would give her a call when it came up.  She agreed.

## 2017-03-15 NOTE — Telephone Encounter (Signed)
PT STATED SHE HAD A SCAM CALL AND WANTED YOU TO KNOW IF ANYONE CALLS CONCERNING HER ABOUT A BRACE IT IS A SCAM.... FYI

## 2017-03-23 NOTE — Progress Notes (Signed)
Office Visit Note  Patient: Anne Macias             Date of Birth: Jan 21, 1923           MRN: [id number]             PCP: Irven Shelling, MD Referring: Jessy Oto, MD Visit Date: 03/29/2017 Occupation: @GUAROCC @    Subjective:  Vertebral compression fracture   History of Present Illness: Anne Macias is a 83 y.o. female seen in consultation per request of Dr. date come for evaluation of L2 compression fracture. Patient has known history of degenerative disc disease. She states one year ago she started having lower back pain and went to the emergency room after that she was sent for physical therapy for 6 weeks. She is doing some exercises at home which aggravated her lower back pain. She came to see Dr. Louanne Skye, in February 2018 who diagnosed her with disc disease of lumbar spine with the spinal stenosis and L2 compression fracture. As she had delayed healing of the compression fracture she was referred to me for evaluation and treatment. A DEXA scan was ordered in December which I do not see the results of. She states she's taken Fosamax for 19 years and stopped it about 10 years ago. Her son is a Psychiatric nurse in town Dr. Harlow Mares who had detailed discussion with the family last weekend and did not like any bisphosphonates. She states she takes calcium and vitamin D on a regular basis and she is very active.  Activities of Daily Living:  Patient reports morning stiffness for 0 minute.   Patient Denies nocturnal pain.  Difficulty dressing/grooming: Denies Difficulty climbing stairs: Denies Difficulty getting out of chair: Reports Difficulty using hands for taps, buttons, cutlery, and/or writing: Denies   Review of Systems  Constitutional: Negative for fatigue, night sweats, weight gain, weight loss and weakness.  HENT: Positive for mouth dryness. Negative for mouth sores, trouble swallowing, trouble swallowing and nose dryness.   Eyes: Negative for pain, redness, visual  disturbance and dryness.  Respiratory: Negative for cough, shortness of breath and difficulty breathing.   Cardiovascular: Negative for chest pain, palpitations, hypertension, irregular heartbeat and swelling in legs/feet.       Pacemaker  Gastrointestinal: Negative for blood in stool, constipation and diarrhea.  Endocrine: Negative for increased urination.  Genitourinary: Negative for vaginal dryness.  Musculoskeletal: Positive for arthralgias and joint pain. Negative for joint swelling, myalgias, muscle weakness, morning stiffness, muscle tenderness and myalgias.  Skin: Negative for color change, rash, hair loss, skin tightness, ulcers and sensitivity to sunlight.  Allergic/Immunologic: Negative for susceptible to infections.  Neurological: Negative for dizziness, memory loss and night sweats.  Hematological: Negative for swollen glands.  Psychiatric/Behavioral: Negative for depressed mood and sleep disturbance. The patient is not nervous/anxious.     PMFS History:  Patient Active Problem List   Diagnosis Date Noted  . Chronic midline low back pain without sciatica 11/05/2016  . Spinal stenosis of lumbar region with neurogenic claudication 11/05/2016  . Spondylolisthesis, lumbar region 11/05/2016  . Pyelonephritis 07/14/2016  . Closed compression fracture of L2 lumbar vertebra, with delayed healing, subsequent encounter 07/14/2016  . UTI (urinary tract infection) 07/14/2016  . Fall 06/20/2014  . Anticoagulated on Coumadin 06/20/2014  . Hyperlipidemia 06/20/2014  . Acute blood loss anemia 06/20/2014  . Pelvic fracture ([place]) 06/19/2014  . Tremor 05/08/2014  . Renal insufficiency 03/12/2014  . Anorexia 03/12/2014  . Sinus node dysfunction ([place]) 02/17/2012  .  Essential hypertension, benign 12/09/2010  . ATRIAL FIBRILLATION 12/09/2010  . PACEMAKER, PERMANENT-Medtronic REVO 12/08/2010    Past Medical History:  Diagnosis Date  . Chronic anticoagulation   . Edema of lower extremity    . Hyperlipidemia   . Hypertension   . PAF (paroxysmal atrial fibrillation) ([place]) 1998  . S/P cardiac pacemaker procedure Sept 7902   complicated by pocket hematoma  . Tachy-brady syndrome ([place])   . Tremor 05/08/2014  . Visual disturbance     Family History  Problem Relation Age of Onset  . Heart attack Mother   . Pneumonia Father   . Heart attack Brother   . Alcohol abuse Brother    Past Surgical History:  Procedure Laterality Date  . CARDIOVERSION N/A 02/26/2014   Procedure: CARDIOVERSION;  Surgeon: Deboraha Sprang, MD;  Location: [facility] ENDOSCOPY;  Service: Cardiovascular;  Laterality: N/A;  . CARDIOVERSION N/A 03/19/2014   Procedure: CARDIOVERSION;  Surgeon: Jamilette Spark, MD;  Location[facility] ENDOSCOPY;  Service: Cardiovascular;  Laterality: N/A;  . CARDIOVERSION N/A 06/17/2015   Procedure: CARDIOVERSION;  Surgeon: Shylah Spark, MD;  Location: [place];  Service: Cardiovascular;  Laterality: N/A;  . CATARACT EXTRACTION    . CHOLECYSTECTOMY  2005  . INSERT / REPLACE / REMOVE PACEMAKER  08/28/2010   implantaton  . US ECHOCARDIOGRAPHY  04/10/2010   EF 55-60%   Social History   Social History Narrative  . No narrative on file     Objective: Vital Signs: BP (!) 177/78 (BP Location: Left Arm, Patient Position: Sitting, Cuff Size: Normal)   Pulse 73   Resp 14   Ht 5\' 5"  (1.651 m)   Wt 142 lb (64.4 kg)   BMI 23.63 kg/m    Physical Exam  Constitutional: She is oriented to person, place, and time. She appears well-developed and well-nourished.  HENT:  Head: Normocephalic and atraumatic.  Eyes: Conjunctivae and EOM are normal.  Neck: Normal range of motion.  Cardiovascular: Normal rate, regular rhythm, normal heart sounds and intact distal pulses.   Pulmonary/Chest: Effort normal and breath sounds normal.  Abdominal: Soft. Bowel sounds are normal.  Lymphadenopathy:    She has no cervical adenopathy.  Neurological: She is alert and oriented to person, place, and  time.  Skin: Skin is warm and dry. Capillary refill takes less than 2 seconds.  Psychiatric: She has a normal mood and affect. Her behavior is normal.  Nursing note and vitals reviewed.    Musculoskeletal Exam: C-spine and thoracic spine good range of motion she has mild thoracic kyphosis. She has some tenderness in the lumbar region. But no point tenderness was noted. Shoulder joints elbow joints wrist joint MCPs PIPs DIPs with good range of motion with no synovitis. Hip joints knee joints ankles MTPs PIPs DIPs are good range of motion with no synovitis.  CDAI Exam: No CDAI exam completed.    Investigation: No additional findings.   Imaging: No results found.  Speciality Comments: No specialty comments available.  07/15/2016 CBC hemoglobin 11.7, 10/16/2016 AST 36, ALT 36 total protein low at 5.6, 09/14/2016 TSH normal   Procedures:  No procedures performed Allergies: Patient has no known allergies.   Assessment / Plan:     Visit Diagnoses: Closed compression fracture of L2 lumbar vertebra, with delayed healing, subsequent encounter: She continues to have some lumbar spine discomfort. She has nonhealing fracture. Patient states that she's been treated with Fosamax for 19 years by Dr. Laurann Montana and was taken off the medication about  10 years ago. She had detailed discussion with her family and does not want to take any bisphosphonates. She has a pending bone density we will call and see if that can be done today. I will also obtain following labs to evaluate this further. After detailed discussion patient stated that she would like Dr. Laurann Montana to order the bone density. Have advised her to contact Dr. Laurann Montana as soon as possible so we can initiate therapy as needed.  Spondylolisthesis, lumbar region: Chronic lower back pain  Anticoagulated on Coumadin  Essential hypertension, benign  Mixed hyperlipidemia  Paroxysmal atrial fibrillation (HCC)  PACEMAKER, PERMANENT-Medtronic  REVO  Renal insufficiency    Orders: No orders of the defined types were placed in this encounter.  No orders of the defined types were placed in this encounter.   Face-to-face time spent with patient was 40 minutes. 50% of time was spent in counseling and coordination of care.  Follow-Up Instructions: Return for Osteoporosis.   Bo Merino, MD  Note - This record has been created using Editor, commissioning.  Chart creation errors have been sought, but may not always  have been located. Such creation errors do not reflect on  the standard of medical care.

## 2017-03-29 ENCOUNTER — Telehealth: Payer: Self-pay | Admitting: Rheumatology

## 2017-03-29 ENCOUNTER — Ambulatory Visit (INDEPENDENT_AMBULATORY_CARE_PROVIDER_SITE_OTHER): Payer: Medicare Other | Admitting: Rheumatology

## 2017-03-29 ENCOUNTER — Encounter: Payer: Self-pay | Admitting: Rheumatology

## 2017-03-29 ENCOUNTER — Encounter (INDEPENDENT_AMBULATORY_CARE_PROVIDER_SITE_OTHER): Payer: Self-pay

## 2017-03-29 VITALS — BP 177/78 | HR 73 | Resp 14 | Ht 65.0 in | Wt 142.0 lb

## 2017-03-29 DIAGNOSIS — E782 Mixed hyperlipidemia: Secondary | ICD-10-CM

## 2017-03-29 DIAGNOSIS — Z5181 Encounter for therapeutic drug level monitoring: Secondary | ICD-10-CM | POA: Diagnosis not present

## 2017-03-29 DIAGNOSIS — I10 Essential (primary) hypertension: Secondary | ICD-10-CM

## 2017-03-29 DIAGNOSIS — S32020G Wedge compression fracture of second lumbar vertebra, subsequent encounter for fracture with delayed healing: Secondary | ICD-10-CM

## 2017-03-29 DIAGNOSIS — M4316 Spondylolisthesis, lumbar region: Secondary | ICD-10-CM | POA: Diagnosis not present

## 2017-03-29 DIAGNOSIS — Z950 Presence of cardiac pacemaker: Secondary | ICD-10-CM

## 2017-03-29 DIAGNOSIS — N289 Disorder of kidney and ureter, unspecified: Secondary | ICD-10-CM

## 2017-03-29 DIAGNOSIS — Z7901 Long term (current) use of anticoagulants: Secondary | ICD-10-CM

## 2017-03-29 DIAGNOSIS — I480 Paroxysmal atrial fibrillation: Secondary | ICD-10-CM

## 2017-03-29 LAB — CBC WITH DIFFERENTIAL/PLATELET
Basophils Absolute: 0 cells/uL (ref 0–200)
Basophils Relative: 0 %
EOS ABS: 90 {cells}/uL (ref 15–500)
EOS PCT: 2 %
HCT: 39.2 % (ref 35.0–45.0)
Hemoglobin: 12.5 g/dL (ref 11.7–15.5)
Lymphocytes Relative: 40 %
Lymphs Abs: 1800 cells/uL (ref 850–3900)
MCH: 27.7 pg (ref 27.0–33.0)
MCHC: 31.9 g/dL — ABNORMAL LOW (ref 32.0–36.0)
MCV: 86.9 fL (ref 80.0–100.0)
MONOS PCT: 9 %
MPV: 10.5 fL (ref 7.5–12.5)
Monocytes Absolute: 405 cells/uL (ref 200–950)
NEUTROS ABS: 2205 {cells}/uL (ref 1500–7800)
Neutrophils Relative %: 49 %
PLATELETS: 174 10*3/uL (ref 140–400)
RBC: 4.51 MIL/uL (ref 3.80–5.10)
RDW: 15.1 % — ABNORMAL HIGH (ref 11.0–15.0)
WBC: 4.5 10*3/uL (ref 3.8–10.8)

## 2017-03-29 LAB — CMP 10231
AG RATIO: 1.1 ratio (ref 1.0–2.5)
ALK PHOS: 60 U/L (ref 33–130)
ALT: 37 U/L — ABNORMAL HIGH (ref 6–29)
AST: 41 U/L — AB (ref 10–35)
Albumin: 3 g/dL — ABNORMAL LOW (ref 3.6–5.1)
BUN / CREAT RATIO: 17.6 ratio (ref 6–22)
BUN: 21 mg/dL (ref 7–25)
CHLORIDE: 102 mmol/L (ref 98–110)
CO2: 26 mmol/L (ref 20–31)
CREATININE: 1.19 mg/dL — AB (ref 0.60–0.88)
Calcium: 8.5 mg/dL — ABNORMAL LOW (ref 8.6–10.4)
GFR, EST AFRICAN AMERICAN: 46 mL/min — AB (ref >=60)
GFR, Est Non African American: 40 mL/min — ABNORMAL LOW (ref >=60)
Globulin: 2.8 g/dL (ref 1.9–3.7)
Glucose, Bld: 107 mg/dL — ABNORMAL HIGH (ref 65–99)
POTASSIUM: 4.6 mmol/L (ref 3.5–5.3)
SODIUM: 141 mmol/L (ref 135–146)
Total Bilirubin: 0.6 mg/dL (ref 0.2–1.2)
Total Protein: 5.8 g/dL — ABNORMAL LOW (ref 6.1–8.1)

## 2017-03-29 NOTE — Patient Instructions (Signed)
Teriparatide injection What is this medicine? TERIPARATIDE (terr ih PAR a tyd) increases bone mass and strength. It helps make healthy bone and to slow bone loss. This medicine is used to prevent bone fractures. This medicine may be used for other purposes; ask your health care provider or pharmacist if you have questions. COMMON BRAND NAME(S): Forteo What should I tell my health care provider before I take this medicine? They need to know if you have any of these conditions: -bone disease other than osteoporosis -high levels of calcium in the blood -history of cancer in the bone -kidney stone -Paget's disease -parathyroid disease -receiving radiation therapy -an unusual or allergic reaction to teriparatide, other medicines, foods, dyes, or preservatives -pregnant or trying to get pregnant -breast-feeding How should I use this medicine? This medicine is for injection under the skin. You will be taught how to prepare and give this medicine. Use exactly as directed. Take your medicine at regular intervals. Do not take your medicine more often than directed. It is important that you put your used needles and pens in a special sharps container. Do not put them in a trash can. If you do not have a sharps container, call your pharmacist or health care provider to get one. A special MedGuide will be given to you by the pharmacist with each prescription and refill. Be sure to read this information carefully each time. Talk to your pediatrician regarding the use of this medicine in children. Special care may be needed. Overdosage: If you think you have taken too much of this medicine contact a poison control center or emergency room at once. NOTE: This medicine is only for you. Do not share this medicine with others. What if I miss a dose? If you miss a dose, take it as soon as you can. If it is almost time for your next dose, take only that dose. Do not take double or extra doses. What may interact  with this medicine? -digoxin This list may not describe all possible interactions. Give your health care provider a list of all the medicines, herbs, non-prescription drugs, or dietary supplements you use. Also tell them if you smoke, drink alcohol, or use illegal drugs. Some items may interact with your medicine. What should I watch for while using this medicine? Visit your doctor or health care professional for regular checks on your progress. Your doctor may order blood tests and other tests to see how you are doing. You should make sure you get enough calcium and vitamin D while you are taking this medicine, unless your doctor tells you not to. Discuss the foods you eat and the vitamins you take with your health care professional. Dennis Bast may get drowsy or dizzy. Do not drive, use machinery, or do anything that needs mental alertness until you know how this medicine affects you. Do not stand or sit up quickly, especially if you are an older patient. This reduces the risk of dizzy or fainting spells. Talk to your doctor about your risk of cancer. You may be more at risk for certain types of cancers if you take this medicine. What side effects may I notice from receiving this medicine? Side effects that you should report to your doctor or health care professional as soon as possible: -allergic reactions like skin rash, itching or hives, swelling of the face, lips, or tongue -blood in the urine; pain in the lower back or side; pain when urinating -signs and symptoms of low blood pressure like dizziness;  feeling faint or lightheaded, falls; unusually weak or tired -signs and symptoms of increased calcium like nausea; vomiting; constipation; low energy; or muscle weakness Side effects that usually do not require medical attention (report these to your doctor or health care professional if they continue or are bothersome): -headache -joint pain -nausea -pain, redness, irritation or swelling at the  injection site -stomach upset This list may not describe all possible side effects. Call your doctor for medical advice about side effects. You may report side effects to FDA at 1-800-FDA-1088. Where should I keep my medicine? Keep out of the reach of children. Store the pens in a refrigerator between 2 and 8 degrees C (36 and 46 degrees F). Do not freeze. Use the pen quickly after taking out of the refrigerator and recap and return to refrigerator right after using. Protect from light. Throw away any unused medicine 28 days after the first injection from the pen. Throw away any unused medicine after the expiration date on the label. NOTE: This sheet is a summary. It may not cover all possible information. If you have questions about this medicine, talk to your doctor, pharmacist, or health care provider.  2018 Elsevier/Gold Standard (2016-04-06 10:23:57)  

## 2017-03-29 NOTE — Telephone Encounter (Signed)
Patient was seen in the office this morning and says she told Dr. Estanislado Pandy that she could get her bone density scan done today but patient was unable to do so. Patient would like to know if Dr. Estanislado Pandy would still send the order for the bone density to the breast center so she can obtain an appointment there. Please advise.

## 2017-03-29 NOTE — Telephone Encounter (Signed)
Patient can call herself, order is in from Dr Louanne Skye.  I have called her to provide number [phone] She states she wanted to go today, I advised they did not hold the spot for her, but she may call to make the appointment. She voiced understanding.

## 2017-03-30 LAB — PARATHYROID HORMONE, INTACT (NO CA): PTH: 87 pg/mL — ABNORMAL HIGH (ref 14–64)

## 2017-03-30 LAB — VITAMIN D 25 HYDROXY (VIT D DEFICIENCY, FRACTURES): VIT D 25 HYDROXY: 35 ng/mL (ref 30–100)

## 2017-04-01 LAB — PROTEIN ELECTROPHORESIS, SERUM, WITH REFLEX
ALBUMIN ELP: 3 g/dL — AB (ref 3.8–4.8)
Alpha-1-Globulin: 0.3 g/dL (ref 0.2–0.3)
Alpha-2-Globulin: 0.8 g/dL (ref 0.5–0.9)
Beta 2: 0.4 g/dL (ref 0.2–0.5)
Beta Globulin: 0.5 g/dL (ref 0.4–0.6)
Gamma Globulin: 0.8 g/dL (ref 0.8–1.7)
TOTAL PROTEIN, SERUM ELECTROPHOR: 5.8 g/dL — AB (ref 6.1–8.1)

## 2017-04-05 ENCOUNTER — Ambulatory Visit
Admission: RE | Admit: 2017-04-05 | Discharge: 2017-04-05 | Disposition: A | Discharge status: Home / Self Care | Payer: Medicare Other | Source: Ambulatory Visit | Attending: Specialist | Admitting: Specialist

## 2017-04-07 LAB — IFE INTERPRETATION

## 2017-04-07 NOTE — Progress Notes (Signed)
PTH is mildly elevated not significant, she has low GFR I do not have any previous values to compare with. Please fax these results to her PCP

## 2017-04-09 ENCOUNTER — Ambulatory Visit: Payer: Medicare Other | Admitting: Rheumatology

## 2017-04-13 ENCOUNTER — Other Ambulatory Visit: Payer: Self-pay | Admitting: Rheumatology

## 2017-04-13 DIAGNOSIS — Z79899 Other long term (current) drug therapy: Secondary | ICD-10-CM

## 2017-04-13 MED ORDER — ALENDRONATE SODIUM 70 MG PO TABS: 70.0000 mg | ORAL_TABLET | ORAL | 1 refills | Status: DC

## 2017-04-13 NOTE — Telephone Encounter (Signed)
Patient advised to come into office to recheck CMP with GFR in 1 month.

## 2017-04-13 NOTE — Telephone Encounter (Signed)
Patient has contacted the office and has changed her mind about restarting the Fosamax since her new patient visit on 03/29/17. Patient has her new patient follow up on 04/28/17. Can we go ahead and send prescription for Fosamax to the pharmacy?   Labs: 03/29/17 PTH is mildly elevated not significant, she has low GFR I do not have any previous values to compare with. Please fax these results to her PCP

## 2017-04-13 NOTE — Telephone Encounter (Signed)
Patient calling to get rx for Fosimax called in. She has decided to start this meds back again. Patient uses [facility] on [address]. Please call patient when rx called in.

## 2017-04-13 NOTE — Telephone Encounter (Signed)
Okay to give Fosamax 70 mg by mouth every week total one-month supply with 2 refills. Check CMP with GFR in 1 month.

## 2017-04-16 NOTE — Progress Notes (Signed)
Office Visit Note  Patient: Anne Macias             Date of Birth: 08/30/1923           MRN: [id number]             PCP: Lavone Orn, MD Referring: Lavone Orn, MD Visit Date: 04/29/2017 Occupation: @GUAROCC @    Subjective:  Back pain.   History of Present Illness: Anne Macias is a 83 y.o. female with history of osteoporosis and L2 vertebral fracture. Anne Macias had recent bone density which showed T score of -2.5. Anne Macias restarted taking Fosamax once a week. And has been tolerating it. Anne Macias decided not to go on Forteo. Anne Macias states Anne Macias is not in much discomfort except for prolonged standing causes some discomfort. Anne Macias denies any radiculopathy.  Activities of Daily Living:  Patient reports morning stiffness for 0 minute.   Patient Denies nocturnal pain.  Difficulty dressing/grooming: Denies Difficulty climbing stairs: Denies Difficulty getting out of chair: Reports Difficulty using hands for taps, buttons, cutlery, and/or writing: Denies   Review of Systems  Constitutional: Negative for fatigue, night sweats, weight gain, weight loss and weakness.  HENT: Negative for mouth sores, trouble swallowing, trouble swallowing, mouth dryness and nose dryness.   Eyes: Negative for pain, redness, visual disturbance and dryness.  Respiratory: Negative for cough, shortness of breath and difficulty breathing.   Cardiovascular: Negative for chest pain, palpitations, hypertension, irregular heartbeat and swelling in legs/feet.  Gastrointestinal: Negative for blood in stool, constipation and diarrhea.  Endocrine: Negative for increased urination.  Genitourinary: Negative for vaginal dryness.  Musculoskeletal: Negative for arthralgias, joint pain, joint swelling, myalgias, muscle weakness, morning stiffness, muscle tenderness and myalgias.       Lower back pain  Skin: Negative for color change, rash, hair loss, skin tightness, ulcers and sensitivity to sunlight.  Allergic/Immunologic: Negative  for susceptible to infections.  Neurological: Negative for dizziness, memory loss and night sweats.  Hematological: Negative for swollen glands.  Psychiatric/Behavioral: Negative for depressed mood and sleep disturbance. The patient is not nervous/anxious.     PMFS History:  Patient Active Problem List   Diagnosis Date Noted  . Chronic midline low back pain without sciatica 11/05/2016  . Spinal stenosis of lumbar region with neurogenic claudication 11/05/2016  . Spondylolisthesis, lumbar region 11/05/2016  . Pyelonephritis 07/14/2016  . Closed compression fracture of L2 lumbar vertebra, with delayed healing, subsequent encounter 07/14/2016  . UTI (urinary tract infection) 07/14/2016  . Fall 06/20/2014  . Anticoagulated on Coumadin 06/20/2014  . Hyperlipidemia 06/20/2014  . Acute blood loss anemia 06/20/2014  . Pelvic fracture ([place]) 06/19/2014  . Tremor 05/08/2014  . Renal insufficiency 03/12/2014  . Anorexia 03/12/2014  . Sinus node dysfunction ([place]) 02/17/2012  . Essential hypertension, benign 12/09/2010  . ATRIAL FIBRILLATION 12/09/2010  . PACEMAKER, PERMANENT-Medtronic REVO 12/08/2010    Past Medical History:  Diagnosis Date  . Chronic anticoagulation   . Edema of lower extremity   . Hyperlipidemia   . Hypertension   . PAF (paroxysmal atrial fibrillation) ([place]) 1998  . S/P cardiac pacemaker procedure Sept 0737   complicated by pocket hematoma  . Tachy-brady syndrome ([place])   . Tremor 05/08/2014  . Visual disturbance     Family History  Problem Relation Age of Onset  . Heart attack Mother   . Pneumonia Father   . Heart attack Brother   . Alcohol abuse Brother    Past Surgical History:  Procedure Laterality Date  .  CARDIOVERSION N/A 02/26/2014   Procedure: CARDIOVERSION;  Surgeon: Deboraha Sprang, MD;  Location: [facility] ENDOSCOPY;  Service: Cardiovascular;  Laterality: N/A;  . CARDIOVERSION N/A 03/19/2014   Procedure: CARDIOVERSION;  Surgeon: Makaela Spark, MD;   Location: [facility] ENDOSCOPY;  Service: Cardiovascular;  Laterality: N/A;  . CARDIOVERSION N/A 06/17/2015   Procedure: CARDIOVERSION;  Surgeon: Arrietty Spark, MD;  Location: [place];  Service: Cardiovascular;  Laterality: N/A;  . CATARACT EXTRACTION    . CHOLECYSTECTOMY  2005  . INSERT / REPLACE / REMOVE PACEMAKER  08/28/2010   implantaton  . US ECHOCARDIOGRAPHY  04/10/2010   EF 55-60%   Social History   Social History Narrative  . No narrative on file     Objective: Vital Signs: BP 133/72   Pulse 74   Resp 14   Ht 5\' 3"  (1.6 m)   Wt 148 lb (67.1 kg)   BMI 26.22 kg/m    Physical Exam  Constitutional: Anne Macias is oriented to person, place, and time. Anne Macias appears well-developed and well-nourished.  HENT:  Head: Normocephalic and atraumatic.  Eyes: Conjunctivae and EOM are normal.  Neck: Normal range of motion.  Cardiovascular: Normal rate, regular rhythm, normal heart sounds and intact distal pulses.   Bilateral lower extremity pedal edema  Pulmonary/Chest: Effort normal and breath sounds normal.  Abdominal: Soft. Bowel sounds are normal.  Lymphadenopathy:    Anne Macias has no cervical adenopathy.  Neurological: Anne Macias is alert and oriented to person, place, and time.  Skin: Skin is warm and dry. Capillary refill takes less than 2 seconds.  Psychiatric: Anne Macias has a normal mood and affect. Her behavior is normal.  Nursing note and vitals reviewed.    Musculoskeletal Exam: C-spine good range of motion. Anne Macias has thoracic kyphosis. Lumbar spine some discomfort range of motion. Shoulder joints elbow joints wrist joint MCPs PIPs DIPs are good range of motion with no synovitis. Hip joints knee joints ankles MTPs PIPs with good range of motion with no synovitis.  CDAI Exam: No CDAI exam completed.    Investigation: Findings:  09/14/2016 TSH normal  03/29/2017 SPEP poorly defined band of restricted protein detected in the beta globulins, unlikely to represent a monoclonal protein,  IFE no  monoclonal protein, CMP elevated LFT's AST 41 ALT 37 Calcium low 8.5 GRF low 40, Creat elevated 1.19, CBC normal, Vit D 35,PTH elevated 87,     Imaging: Dg Bone Density (dxa)  Result Date: 04/05/2017 EXAM: DUAL X-RAY ABSORPTIOMETRY (DXA) FOR BONE MINERAL DENSITY IMPRESSION: Referring Physician:  Jessy Oto PATIENT: Name: Anne Macias, Anne Macias Patient ID: [id number] Birth Date: 07/01/1923 Height: 64.5 in. Sex: Female Measured: 04/05/2017 Weight: 140.2 lbs. Indications: Advanced Age, Caucasian, Estrogen Deficient, Height Loss (781.91), Postmenopausal Fractures: Forearm Treatments: Calcium (E943.0) ASSESSMENT: The BMD measured at Femur Neck Right is 0.696 g/cm2 with a T-score of -2.5. This patient is considered OSTEOPOROTIC according to [facility]) criteria. L-1 and L-2 were excluded due to degenerative changes as well as due to chronic L2 compression fracture. Per the official positions of the ISCD, it is not possible to quantitatively compare BMD or calculate an [facility] between exams done at different facilities. Site Region Measured Date Measured Age YA BMD Significant CHANGE T-score DualFemur Neck Right 04/05/2017    92.0         -2.5    0.696 g/cm2 AP Spine  L3-L4      04/05/2017    92.0         -1.3  1.059 g/cm2 World Health Organization [facility]) criteria for post-menopausal, Caucasian Women: Normal       T-score at or above -1 SD Osteopenia   T-score between -1 and -2.5 SD Osteoporosis T-score at or below -2.5 SD RECOMMENDATION: [place] recommends that FDA-approved medical therapies be considered in postmenopausal women and men age 36 or older with a: 1. Hip or vertebral (clinical or morphometric) fracture. 2. T-score of <-2.5 at the spine or hip. 3. Ten-year fracture probability by FRAX of 3% or greater for hip fracture or 20% or greater for major osteoporotic fracture. All treatment decisions require clinical judgment and consideration of individual patient factors,  including patient preferences, co-morbidities, previous drug use, risk factors not captured in the FRAX model (e.g. falls, vitamin D deficiency, increased bone turnover, interval significant decline in bone density) and possible under - or over-estimation of fracture risk by FRAX. All patients should ensure an adequate intake of dietary calcium (1200 mg/d) and vitamin D (800 IU daily) unless contraindicated. FOLLOW-UP: People with diagnosed cases of osteoporosis or at high risk for fracture should have regular bone mineral density tests. For patients eligible for Medicare, routine testing is allowed once every 2 years. The testing frequency can be increased to one year for patients who have rapidly progressing disease, those who are receiving or discontinuing medical therapy to restore bone mass, or have additional risk factors. I have reviewed this report, and agree with the above findings. Mark A. Thornton Papas, M.D. [facility] Radiology Electronically Signed   By: Lavonia Dana M.D.   On: 04/05/2017 14:07    Speciality Comments: No specialty comments available.    Procedures:  No procedures performed Allergies: Patient has no known allergies.   Assessment / Plan:     Visit Diagnoses: Age-related osteoporosis with current pathological fracture with delayed healing, subsequent encounter : Her lower back pain is tolerable now. Anne Macias had more stress recent DEXA scan which showed T score of -2.5. Anne Macias has started taking Fosamax recently and has been tolerating it well. At this point Anne Macias wants to continue Fosamax. We had detailed discussion regarding osteoporosis. Use of calcium and vitamin D and resistive exercises were discussed. Anne Macias's been walking some as tolerated. I offered physical therapy for fall prevention and muscle strengthening. At this point Anne Macias declined. Anne Macias will continue to do some exercises which were demonstrated in the office today. Anne Macias will have labs done by Dr. Laurann Montana in July and will forward to [place].  We will repeat bone density in 2 years.  Closed compression fracture of L2 lumbar vertebra, with delayed healing, subsequent encounter: Followed up by Dr. Louanne Skye.  Chronic midline low back pain without sciatica  History of pelvic fracture  Spondylolisthesis, lumbar region  Spinal stenosis of lumbar region with neurogenic claudication  History of hypertension  Anticoagulated on Coumadin  History of anemia    Orders: No orders of the defined types were placed in this encounter.  No orders of the defined types were placed in this encounter.   Face-to-face time spent with patient was 30 minutes. 50% of time was spent in counseling and coordination of care.  Follow-Up Instructions: Return in about 6 months (around 10/29/2017) for Osteoporosis,DDD.   Bo Merino, MD  Note - This record has been created using Editor, commissioning.  Chart creation errors have been sought, but may not always  have been located. Such creation errors do not reflect on  the standard of medical care.

## 2017-04-22 ENCOUNTER — Ambulatory Visit (INDEPENDENT_AMBULATORY_CARE_PROVIDER_SITE_OTHER): Payer: Medicare Other | Admitting: Specialist

## 2017-04-23 ENCOUNTER — Encounter (INDEPENDENT_AMBULATORY_CARE_PROVIDER_SITE_OTHER): Payer: Self-pay | Admitting: Specialist

## 2017-04-23 ENCOUNTER — Ambulatory Visit (INDEPENDENT_AMBULATORY_CARE_PROVIDER_SITE_OTHER): Payer: Medicare Other | Admitting: Specialist

## 2017-04-23 VITALS — BP 162/81 | HR 75 | Ht 66.0 in | Wt 138.0 lb

## 2017-04-23 DIAGNOSIS — M8000XA Age-related osteoporosis with current pathological fracture, unspecified site, initial encounter for fracture: Secondary | ICD-10-CM

## 2017-04-23 DIAGNOSIS — S32020G Wedge compression fracture of second lumbar vertebra, subsequent encounter for fracture with delayed healing: Secondary | ICD-10-CM | POA: Diagnosis not present

## 2017-04-23 NOTE — Patient Instructions (Addendum)
Avoid frequent bending and stooping  No lifting greater than 10 lbs. May use ice or moist heat for pain. Weight loss is of benefit. Handicap license is approved. See Dr. Estanislado Pandy and see if she is in agreement with returning to [facility] after this 10 year hiatus.

## 2017-04-23 NOTE — Progress Notes (Signed)
Office Visit Note   Patient: Anne Macias           Date of Birth: 1923/05/02           MRN: [id number] Visit Date: 04/23/2017              Requested by: Lavone Orn, MD [address] [place], [place] [address] PCP: Lavone Orn, MD   Assessment & Plan: Visit Diagnoses: No diagnosis found.  Plan: Avoid frequent bending and stooping  No lifting greater than 10 lbs. May use ice or moist heat for pain. Weight loss is of benefit. Handicap license is approved. See Dr. Estanislado Pandy and see if she is in agreement with returning to [facility] after this 10 year hiatus.   Follow-Up Instructions: No Follow-up on file.   Orders:  No orders of the defined types were placed in this encounter.  No orders of the defined types were placed in this encounter.     Procedures: No procedures performed   Clinical Data: No additional findings.   Subjective: Chief Complaint  Patient presents with  . Lower Back - Follow-up    83 year old female with history of a recent compression fracture and delayed healing at the L2 level. She is now much less painful and has pain with standing. She has restarted fosamax and is currently on VItamin D and Calcium. Dr. Estanislado Pandy has recommended forteo. However Anne Macias is on warfarin and is reluctant to inject herself daily. She wishes to take fosamax instead.    Review of Systems  Constitutional: Negative.   HENT: Negative.   Eyes: Negative.   Respiratory: Negative.   Cardiovascular: Negative.   Gastrointestinal: Negative.   Endocrine: Negative.   Genitourinary: Negative.   Musculoskeletal: Negative.   Skin: Negative.   Allergic/Immunologic: Negative.   Neurological: Negative.   Hematological: Negative.   Psychiatric/Behavioral: Negative.      Objective: Vital Signs: BP (!) 162/81 (BP Location: Left Arm, Patient Position: Sitting)   Pulse 75   Ht 5\' 6"  (1.676 m)   Wt 138 lb (62.6 kg)   BMI 22.27 kg/m   Physical Exam   Constitutional: She is oriented to person, place, and time. She appears well-developed and well-nourished.  HENT:  Head: Normocephalic and atraumatic.  Eyes: EOM are normal. Pupils are equal, round, and reactive to light.  Neck: Normal range of motion. Neck supple.  Pulmonary/Chest: Effort normal and breath sounds normal.  Abdominal: Soft. Bowel sounds are normal.  Neurological: She is alert and oriented to person, place, and time.  Skin: Skin is warm and dry.  Psychiatric: She has a normal mood and affect. Her behavior is normal. Judgment and thought content normal.    Back Exam   Tenderness  The patient is experiencing tenderness in the lumbar.  Range of Motion  Extension: abnormal  Flexion: normal  Lateral Bend Right: normal  Lateral Bend Left: normal  Rotation Right: normal  Rotation Left: normal   Tests  Straight leg raise right: negative Straight leg raise left: negative  Reflexes  Patellar: normal Achilles: normal Biceps: normal Babinski's sign: normal   Other  Toe Walk: normal Heel Walk: normal Sensation: normal Gait: normal       Specialty Comments:  No specialty comments available.  Imaging: No results found.   PMFS History: Patient Active Problem List   Diagnosis Date Noted  . Chronic midline low back pain without sciatica 11/05/2016  . Spinal stenosis of lumbar region with neurogenic claudication  11/05/2016  . Spondylolisthesis, lumbar region 11/05/2016  . Pyelonephritis 07/14/2016  . Closed compression fracture of L2 lumbar vertebra, with delayed healing, subsequent encounter 07/14/2016  . UTI (urinary tract infection) 07/14/2016  . Fall 06/20/2014  . Anticoagulated on Coumadin 06/20/2014  . Hyperlipidemia 06/20/2014  . Acute blood loss anemia 06/20/2014  . Pelvic fracture ([place]) 06/19/2014  . Tremor 05/08/2014  . Renal insufficiency 03/12/2014  . Anorexia 03/12/2014  . Sinus node dysfunction ([place]) 02/17/2012  . Essential  hypertension, benign 12/09/2010  . ATRIAL FIBRILLATION 12/09/2010  . PACEMAKER, PERMANENT-Medtronic REVO 12/08/2010   Past Medical History:  Diagnosis Date  . Chronic anticoagulation   . Edema of lower extremity   . Hyperlipidemia   . Hypertension   . PAF (paroxysmal atrial fibrillation) ([place]) 1998  . S/P cardiac pacemaker procedure Sept 6812   complicated by pocket hematoma  . Tachy-brady syndrome ([place])   . Tremor 05/08/2014  . Visual disturbance     Family History  Problem Relation Age of Onset  . Heart attack Mother   . Pneumonia Father   . Heart attack Brother   . Alcohol abuse Brother     Past Surgical History:  Procedure Laterality Date  . CARDIOVERSION N/A 02/26/2014   Procedure: CARDIOVERSION;  Surgeon: Deboraha Sprang, MD;  Location: [facility] ENDOSCOPY;  Service: Cardiovascular;  Laterality: N/A;  . CARDIOVERSION N/A 03/19/2014   Procedure: CARDIOVERSION;  Surgeon: Gretna Spark, MD;  Location: [facility] ENDOSCOPY;  Service: Cardiovascular;  Laterality: N/A;  . CARDIOVERSION N/A 06/17/2015   Procedure: CARDIOVERSION;  Surgeon: Myleka Spark, MD;  Location: [place];  Service: Cardiovascular;  Laterality: N/A;  . CATARACT EXTRACTION    . CHOLECYSTECTOMY  2005  . INSERT / REPLACE / REMOVE PACEMAKER  08/28/2010   implantaton  . US ECHOCARDIOGRAPHY  04/10/2010   EF 55-60%   Social History   Occupational History  . Not on file.   Social History Main Topics  . Smoking status: Never Smoker  . Smokeless tobacco: Never Used  . Alcohol use 4.2 oz/week    7 Glasses of wine per week     Comment: 5 oz red wine daily at dinner  . Drug use: No  . Sexual activity: Not on file

## 2017-04-29 ENCOUNTER — Encounter: Payer: Self-pay | Admitting: Rheumatology

## 2017-04-29 ENCOUNTER — Ambulatory Visit (INDEPENDENT_AMBULATORY_CARE_PROVIDER_SITE_OTHER): Payer: Medicare Other | Admitting: Rheumatology

## 2017-04-29 VITALS — BP 133/72 | HR 74 | Resp 14 | Ht 63.0 in | Wt 148.0 lb

## 2017-04-29 DIAGNOSIS — Z950 Presence of cardiac pacemaker: Secondary | ICD-10-CM | POA: Diagnosis not present

## 2017-04-29 DIAGNOSIS — E782 Mixed hyperlipidemia: Secondary | ICD-10-CM

## 2017-04-29 DIAGNOSIS — N289 Disorder of kidney and ureter, unspecified: Secondary | ICD-10-CM | POA: Diagnosis not present

## 2017-04-29 DIAGNOSIS — M4316 Spondylolisthesis, lumbar region: Secondary | ICD-10-CM

## 2017-04-29 DIAGNOSIS — Z8781 Personal history of (healed) traumatic fracture: Secondary | ICD-10-CM

## 2017-04-29 DIAGNOSIS — Z5181 Encounter for therapeutic drug level monitoring: Secondary | ICD-10-CM

## 2017-04-29 DIAGNOSIS — S32020G Wedge compression fracture of second lumbar vertebra, subsequent encounter for fracture with delayed healing: Secondary | ICD-10-CM

## 2017-04-29 DIAGNOSIS — Z7901 Long term (current) use of anticoagulants: Secondary | ICD-10-CM

## 2017-04-29 DIAGNOSIS — M8000XG Age-related osteoporosis with current pathological fracture, unspecified site, subsequent encounter for fracture with delayed healing: Secondary | ICD-10-CM

## 2017-04-29 DIAGNOSIS — Z8679 Personal history of other diseases of the circulatory system: Secondary | ICD-10-CM

## 2017-04-29 DIAGNOSIS — Z862 Personal history of diseases of the blood and blood-forming organs and certain disorders involving the immune mechanism: Secondary | ICD-10-CM

## 2017-04-29 DIAGNOSIS — M48062 Spinal stenosis, lumbar region with neurogenic claudication: Secondary | ICD-10-CM

## 2017-04-29 DIAGNOSIS — I480 Paroxysmal atrial fibrillation: Secondary | ICD-10-CM | POA: Diagnosis not present

## 2017-07-20 ENCOUNTER — Telehealth: Payer: Self-pay | Admitting: Internal Medicine

## 2017-09-25 ENCOUNTER — Other Ambulatory Visit: Payer: Self-pay | Admitting: Rheumatology

## 2017-09-25 DIAGNOSIS — Z79899 Other long term (current) drug therapy: Secondary | ICD-10-CM

## 2017-09-25 DIAGNOSIS — M810 Age-related osteoporosis without current pathological fracture: Secondary | ICD-10-CM

## 2017-09-27 NOTE — Telephone Encounter (Signed)
ok to give her 30 day supply. Please advise patient to get labs which should include CBC, CMP with GFR and vitamin D

## 2017-09-27 NOTE — Telephone Encounter (Signed)
Patient will come to update labs on 09/28/17.

## 2017-09-27 NOTE — Telephone Encounter (Signed)
Last Visit: 04/29/17 Next Visit: 10/29/17 Labs: 03/29/17 Creat 1.19 Previously normal GFR 40 Previously normal AST 41 Previously 36, ALT 37 Previously 36   Okay to refill Fosamax?

## 2017-09-28 ENCOUNTER — Other Ambulatory Visit: Payer: Self-pay | Admitting: Physician Assistant

## 2017-09-28 ENCOUNTER — Other Ambulatory Visit: Payer: Self-pay

## 2017-09-28 DIAGNOSIS — Z79899 Other long term (current) drug therapy: Secondary | ICD-10-CM

## 2017-09-28 DIAGNOSIS — M810 Age-related osteoporosis without current pathological fracture: Secondary | ICD-10-CM

## 2017-09-29 LAB — COMPLETE METABOLIC PANEL WITH GFR
AG RATIO: 1 (calc) (ref 1.0–2.5)
ALT: 39 U/L — ABNORMAL HIGH (ref 6–29)
AST: 40 U/L — AB (ref 10–35)
Albumin: 2.9 g/dL — ABNORMAL LOW (ref 3.6–5.1)
Alkaline phosphatase (APISO): 50 U/L (ref 33–130)
BUN/Creatinine Ratio: 19 (calc) (ref 6–22)
BUN: 26 mg/dL — ABNORMAL HIGH (ref 7–25)
CALCIUM: 8.3 mg/dL — AB (ref 8.6–10.4)
CO2: 31 mmol/L (ref 20–32)
Chloride: 102 mmol/L (ref 98–110)
Creat: 1.35 mg/dL — ABNORMAL HIGH (ref 0.60–0.88)
GFR, EST NON AFRICAN AMERICAN: 34 mL/min/{1.73_m2} — AB (ref >=60)
GFR, Est African American: 39 mL/min/{1.73_m2} — ABNORMAL LOW (ref >=60)
GLOBULIN: 2.8 g/dL (ref 1.9–3.7)
Glucose, Bld: 89 mg/dL (ref 65–99)
POTASSIUM: 4.3 mmol/L (ref 3.5–5.3)
SODIUM: 141 mmol/L (ref 135–146)
Total Bilirubin: 0.5 mg/dL (ref 0.2–1.2)
Total Protein: 5.7 g/dL — ABNORMAL LOW (ref 6.1–8.1)

## 2017-09-29 LAB — CBC WITH DIFFERENTIAL/PLATELET
BASOS ABS: 28 {cells}/uL (ref 0–200)
Basophils Relative: 0.5 %
Eosinophils Absolute: 151 cells/uL (ref 15–500)
Eosinophils Relative: 2.7 %
HEMATOCRIT: 36.9 % (ref 35.0–45.0)
Hemoglobin: 11.6 g/dL — ABNORMAL LOW (ref 11.7–15.5)
LYMPHS ABS: 2050 {cells}/uL (ref 850–3900)
MCH: 26.6 pg — ABNORMAL LOW (ref 27.0–33.0)
MCHC: 31.4 g/dL — ABNORMAL LOW (ref 32.0–36.0)
MCV: 84.6 fL (ref 80.0–100.0)
MPV: 11 fL (ref 7.5–12.5)
Monocytes Relative: 10.6 %
NEUTROS PCT: 49.6 %
Neutro Abs: 2778 cells/uL (ref 1500–7800)
Platelets: 199 10*3/uL (ref 140–400)
RBC: 4.36 10*6/uL (ref 3.80–5.10)
RDW: 14.8 % (ref 11.0–15.0)
Total Lymphocyte: 36.6 %
WBC: 5.6 10*3/uL (ref 3.8–10.8)
WBCMIX: 594 {cells}/uL (ref 200–950)

## 2017-09-29 LAB — VITAMIN D 25 HYDROXY (VIT D DEFICIENCY, FRACTURES): Vit D, 25-Hydroxy: 38 ng/mL (ref 30–100)

## 2017-09-29 NOTE — Telephone Encounter (Signed)
Patient advised of lab results and copy sent to PCP.  

## 2017-09-29 NOTE — Telephone Encounter (Signed)
Patient returned your call.   She was hoping that it was about her labs.  CB#[phone]

## 2017-09-29 NOTE — Progress Notes (Signed)
Labs indicate anemia, elevated creatinine and elevated LFTs. Labs are stable. Please forward a copy to her PCP.

## 2017-10-01 ENCOUNTER — Encounter: Payer: Self-pay | Admitting: Internal Medicine

## 2017-10-11 ENCOUNTER — Other Ambulatory Visit: Payer: Self-pay | Admitting: Internal Medicine

## 2017-10-15 ENCOUNTER — Ambulatory Visit: Payer: Medicare Other | Admitting: Internal Medicine

## 2017-10-15 ENCOUNTER — Encounter: Payer: Self-pay | Admitting: Internal Medicine

## 2017-10-15 ENCOUNTER — Encounter (INDEPENDENT_AMBULATORY_CARE_PROVIDER_SITE_OTHER): Payer: Self-pay

## 2017-10-15 VITALS — BP 130/68 | HR 64 | Ht 66.0 in | Wt 139.0 lb

## 2017-10-15 DIAGNOSIS — R600 Localized edema: Secondary | ICD-10-CM

## 2017-10-15 DIAGNOSIS — I10 Essential (primary) hypertension: Secondary | ICD-10-CM | POA: Diagnosis not present

## 2017-10-15 DIAGNOSIS — I480 Paroxysmal atrial fibrillation: Secondary | ICD-10-CM

## 2017-10-15 DIAGNOSIS — I495 Sick sinus syndrome: Secondary | ICD-10-CM

## 2017-10-15 DIAGNOSIS — Z950 Presence of cardiac pacemaker: Secondary | ICD-10-CM | POA: Diagnosis not present

## 2017-10-15 LAB — CUP PACEART INCLINIC DEVICE CHECK
Brady Statistic AP VS Percent: 99.96 %
Brady Statistic AS VP Percent: 0 %
Brady Statistic AS VS Percent: 0.04 %
Brady Statistic RA Percent Paced: 99.96 %
Brady Statistic RV Percent Paced: 0 %
Implantable Lead Implant Date: 20110929
Implantable Lead Implant Date: 20110929
Implantable Lead Location: 753860
Lead Channel Impedance Value: 432 Ohm
Lead Channel Impedance Value: 624 Ohm
Lead Channel Pacing Threshold Amplitude: 1 V
Lead Channel Pacing Threshold Pulse Width: 0.4 ms
Lead Channel Sensing Intrinsic Amplitude: 11.039 mV
Lead Channel Sensing Intrinsic Amplitude: 2.481 mV
Lead Channel Setting Pacing Amplitude: 2 V
Lead Channel Setting Sensing Sensitivity: 0.9 mV
MDC IDC LEAD LOCATION: 753859
MDC IDC MSMT BATTERY VOLTAGE: 2.95 V
MDC IDC MSMT LEADCHNL RA PACING THRESHOLD AMPLITUDE: 0.5 V
MDC IDC MSMT LEADCHNL RV PACING THRESHOLD PULSEWIDTH: 0.4 ms
MDC IDC PG IMPLANT DT: 20110929
MDC IDC SESS DTM: 20181116171319
MDC IDC SET LEADCHNL RV PACING AMPLITUDE: 2.5 V
MDC IDC SET LEADCHNL RV PACING PULSEWIDTH: 0.4 ms
MDC IDC STAT BRADY AP VP PERCENT: 0 %

## 2017-10-15 NOTE — Progress Notes (Signed)
Patient Care Team: Lavone Orn, MD as PCP - General (Internal Medicine)   HPI  Anne Macias is a 83 y.o. female Seen in followup for a pacemaker implanted for tachybradycardia syndrome. She has documented atrial fibrillation and is on warfarin.   She underwent cardioversion last week in the context of flecainide which she was tolerating poorly and we done titrated the dose. Begin her on propafenone and undertook cardioversion again. She sustains sinus rhythm.  Antiarrhythmics Date Reason stopped  propafenone  Side effects  Flecainide  Side effects  Amiodarone     She has been much better on amio without recurrent Afib  Patient denies symptoms of GI intolerance, sun sensitivity, neurological symptoms attributable to amiodarone.  Surveillance laboratories were in normal limits when checked    Date TSH LFT  10/17 3.22 41         Echocardiogram 2011 demonstrated normal LV systolic function    Past Medical History:  Diagnosis Date  . Chronic anticoagulation   . Edema of lower extremity   . Hyperlipidemia   . Hypertension   . PAF (paroxysmal atrial fibrillation) ([place]) 1998  . S/P cardiac pacemaker procedure Sept 9562   complicated by pocket hematoma  . Tachy-brady syndrome ([place])   . Tremor 05/08/2014  . Visual disturbance     Past Surgical History:  Procedure Laterality Date  . CANCELLED PROCEDURE  05/29/2015   Performed by Sanda Leiann Sporer, MD at [facility] ENDOSCOPY  . CARDIOVERSION N/A 06/17/2015   Performed by Kemia Spark, MD at [place]  . CARDIOVERSION N/A 03/19/2014   Performed by Eevee Spark, MD at [place]  . CARDIOVERSION N/A 02/26/2014   Performed by Deboraha Sprang, MD at [facility] ENDOSCOPY  . CATARACT EXTRACTION    . CHOLECYSTECTOMY  2005  . INSERT / REPLACE / REMOVE PACEMAKER  08/28/2010   implantaton  . US ECHOCARDIOGRAPHY  04/10/2010   EF 55-60%    Current Outpatient Medications  Medication Sig Dispense Refill  . alendronate  (FOSAMAX) 70 MG tablet TAKE 1 TABLET BY MOUTH ONCE A WEEK WITH A FULL GLASS OF WATER ON AN EMPTY STOMACH 4 tablet 0  . amiodarone (PACERONE) 200 MG tablet Take 1 tablet (200 mg total) by mouth daily. Please keep upcoming appt for future refills. Thanks 90 tablet 0  . Calcium Carbonate-Vitamin D (CALTRATE 600+D PO) Take 2 tablets by mouth daily.      . furosemide (LASIX) 40 MG tablet Take 1 tablet (40 mg total) by mouth daily. 30 tablet 6  . furosemide (LASIX) 80 MG tablet Take 80 mg by mouth daily.  11  . KLOR-CON M20 20 MEQ tablet Take 20 mEq by mouth daily. with food  11  . metoprolol succinate (TOPROL-XL) 100 MG 24 hr tablet Take 1 tablet (100 mg total) 2 (two) times daily by mouth. Please keep upcoming appt with Dr. Caryl Comes for future refills. Thanks 180 tablet 0  . Polyethyl Glycol-Propyl Glycol (SYSTANE OP) Place 1 drop into both eyes 2 (two) times daily.    . potassium chloride (K-DUR,KLOR-CON) 10 MEQ tablet Take 1 tablet (10 mEq total) by mouth daily. 30 tablet 6  . warfarin (COUMADIN) 2 MG tablet Take 1-2 mg by mouth daily. Take 1mg  by mouth on Wednesday and Saturday. On all other days take 2 mg once daily.  4   Current Facility-Administered Medications  Medication Dose Route Frequency Provider Last Rate Last Dose  . calcitonin (salmon) (MIACALCIN/FORTICAL) nasal spray  1 spray  1 spray Alternating Nares Daily Jessy Oto, MD      . calcitonin (salmon) (MIACALCIN/FORTICAL) nasal spray 1 spray  1 spray Alternating Nares Daily Jessy Oto, MD        No Known Allergies  Review of Systems negative except from HPI and PMH  Physical Exam BP 130/68   Pulse 64   Ht 5\' 6"  (1.676 m)   Wt 139 lb (63 kg)   SpO2 98%   BMI 22.44 kg/m  Well developed and well nourished in no acute distress HENT normal E scleral and icterus clear Neck Supple JVP flat; carotids brisk and full Clear to ausculation  Regular rate and rhythm, no murmurs gallops or rub Soft with active bowel sounds 1+  Edema  L.> R  Alert and oriented, grossly normal motor and sensory function Skin Warm and Dry  ECG demonstrates atrial pacing with intrinsic conduction  rate is 71 Intervals 20/13/43 Right Bundle branch block  Assessment and  Plan  Atrial fibrillation-persistent  Sinus node dysfunction  Pacemaker-Medtronic The patient's device was interrogated.  The information was reviewed. No changes were made in the programming.    Edema  Hypertension  Blood pressure well controlled  Tolerating amio without specific complaints discussed with her son and will decrease the dose from 1400--1000 mg/week in divided doses.  We will check amiodarone surveillance laboratories.  There have been some balance issues but they are not worse on amiodarone.  Given that, we will not increase her diuretics as I worry about the issues of urgent urination.  And her edema is stable.  Chronotropic competence is good.

## 2017-10-15 NOTE — Patient Instructions (Addendum)
Your physician has recommended you make the following change in your medication:  DECREASE AMIODARONE TO [place]  Your physician recommends that you return for lab work in: TODAY TSH CBC AND LIVER   Your physician wants you to follow-up in: 01/17/18 REMOTE CHECK AND 6 MONTHS WITH RENEE URSUY NP  You will receive a reminder letter in the mail two months in advance. If you don't receive a letter, please call our office to schedule the follow-up appointment.

## 2017-10-16 LAB — CBC WITH DIFFERENTIAL/PLATELET
Basophils Absolute: 0 10*3/uL (ref 0.0–0.2)
Basos: 0 %
EOS (ABSOLUTE): 0.1 10*3/uL (ref 0.0–0.4)
Eos: 2 %
Hematocrit: 37.3 % (ref 34.0–46.6)
Hemoglobin: 11.7 g/dL (ref 11.1–15.9)
IMMATURE GRANS (ABS): 0 10*3/uL (ref 0.0–0.1)
Immature Granulocytes: 0 %
LYMPHS ABS: 2.1 10*3/uL (ref 0.7–3.1)
LYMPHS: 37 %
MCH: 26.7 pg (ref 26.6–33.0)
MCHC: 31.4 g/dL — AB (ref 31.5–35.7)
MCV: 85 fL (ref 79–97)
MONOCYTES: 12 %
Monocytes Absolute: 0.7 10*3/uL (ref 0.1–0.9)
NEUTROS ABS: 2.8 10*3/uL (ref 1.4–7.0)
Neutrophils: 49 %
PLATELETS: 205 10*3/uL (ref 150–379)
RBC: 4.39 x10E6/uL (ref 3.77–5.28)
RDW: 15.7 % — ABNORMAL HIGH (ref 12.3–15.4)
WBC: 5.8 10*3/uL (ref 3.4–10.8)

## 2017-10-16 LAB — HEPATIC FUNCTION PANEL
ALBUMIN: 3.2 g/dL (ref 3.2–4.6)
ALT: 53 IU/L — AB (ref 0–32)
AST: 48 IU/L — ABNORMAL HIGH (ref 0–40)
Alkaline Phosphatase: 58 IU/L (ref 39–117)
BILIRUBIN, DIRECT: 0.16 mg/dL (ref 0.00–0.40)
Bilirubin Total: 0.4 mg/dL (ref 0.0–1.2)
TOTAL PROTEIN: 5.8 g/dL — AB (ref 6.0–8.5)

## 2017-10-16 LAB — BASIC METABOLIC PANEL
BUN / CREAT RATIO: 22 (ref 12–28)
BUN: 27 mg/dL (ref 10–36)
CHLORIDE: 101 mmol/L (ref 96–106)
CO2: 26 mmol/L (ref 20–29)
Calcium: 8.5 mg/dL — ABNORMAL LOW (ref 8.7–10.3)
Creatinine, Ser: 1.25 mg/dL — ABNORMAL HIGH (ref 0.57–1.00)
GFR calc Af Amer: 43 mL/min/{1.73_m2} — ABNORMAL LOW (ref >59)
GFR calc non Af Amer: 37 mL/min/{1.73_m2} — ABNORMAL LOW (ref >59)
GLUCOSE: 95 mg/dL (ref 65–99)
Potassium: 4.7 mmol/L (ref 3.5–5.2)
SODIUM: 138 mmol/L (ref 134–144)

## 2017-10-19 NOTE — Progress Notes (Signed)
Office Visit Note  Patient: Anne Macias             Date of Birth: [birth year]           MRN: [id number]             PCP: Lavone Orn, MD Referring: Lavone Orn, MD Visit Date: 10/29/2017 Occupation: @GUAROCC @    Subjective:  Medication management   History of Present Illness: Anne Macias is a 83 y.o. female with history of osteoporosis and vertebral fracture. She was started on Fosamax about 4 months ago. She's been tolerating the medication well. She has been taking calcium and vitamin D. She has been active and walking on daily basis.  Activities of Daily Living:  Patient reports morning stiffness for 0 minute.   Patient Denies nocturnal pain.  Difficulty dressing/grooming: Denies Difficulty climbing stairs: Reports Difficulty getting out of chair: Reports Difficulty using hands for taps, buttons, cutlery, and/or writing: Denies   Review of Systems  Constitutional: Negative.  Negative for fatigue and weakness.  HENT: Positive for mouth dryness.   Eyes: Negative.  Negative for dryness.  Respiratory: Negative.  Negative for cough and shortness of breath.   Cardiovascular: Positive for hypertension. Negative for chest pain, palpitations and swelling in legs/feet.  Gastrointestinal: Negative.  Negative for blood in stool, constipation and diarrhea.  Musculoskeletal: Negative.  Negative for arthralgias, joint pain, joint swelling, myalgias, muscle weakness, morning stiffness, muscle tenderness and myalgias.  Skin: Negative.  Negative for rash, hair loss and sensitivity to sunlight.  Neurological: Negative.  Negative for dizziness, numbness and headaches.  Psychiatric/Behavioral: Negative.  Negative for depressed mood and sleep disturbance.    PMFS History:  Patient Active Problem List   Diagnosis Date Noted  . Chronic midline low back pain without sciatica 11/05/2016  . Spinal stenosis of lumbar region with neurogenic claudication 11/05/2016  .  Spondylolisthesis, lumbar region 11/05/2016  . Pyelonephritis 07/14/2016  . Closed compression fracture of L2 lumbar vertebra, with delayed healing, subsequent encounter 07/14/2016  . UTI (urinary tract infection) 07/14/2016  . Fall 06/20/2014  . Anticoagulated on Coumadin 06/20/2014  . Hyperlipidemia 06/20/2014  . Acute blood loss anemia 06/20/2014  . Pelvic fracture ([place]) 06/19/2014  . Tremor 05/08/2014  . Renal insufficiency 03/12/2014  . Anorexia 03/12/2014  . Sinus node dysfunction ([place]) 02/17/2012  . Essential hypertension, benign 12/09/2010  . ATRIAL FIBRILLATION 12/09/2010  . PACEMAKER, PERMANENT-Medtronic REVO 12/08/2010    Past Medical History:  Diagnosis Date  . Chronic anticoagulation   . Edema of lower extremity   . Hyperlipidemia   . Hypertension   . PAF (paroxysmal atrial fibrillation) ([place]) 1998  . S/P cardiac pacemaker procedure Sept 0350   complicated by pocket hematoma  . Tachy-brady syndrome ([place])   . Tremor 05/08/2014  . Visual disturbance     Family History  Problem Relation Age of Onset  . Heart attack Mother   . Pneumonia Father   . Heart attack Brother   . Alcohol abuse Brother    Past Surgical History:  Procedure Laterality Date  . CARDIOVERSION N/A 02/26/2014   Procedure: CARDIOVERSION;  Surgeon: Deboraha Sprang, MD;  Location: [facility] ENDOSCOPY;  Service: Cardiovascular;  Laterality: N/A;  . CARDIOVERSION N/A 03/19/2014   Procedure: CARDIOVERSION;  Surgeon: Kenslei Spark, MD;  Location: [facility] ENDOSCOPY;  Service: Cardiovascular;  Laterality: N/A;  . CARDIOVERSION N/A 06/17/2015   Procedure: CARDIOVERSION;  Surgeon: Daveda Spark, MD;  Location: [place];  Service: Cardiovascular;  Laterality: N/A;  . CATARACT EXTRACTION    . CHOLECYSTECTOMY  2005  . INSERT / REPLACE / REMOVE PACEMAKER  08/28/2010   implantaton  . US ECHOCARDIOGRAPHY  04/10/2010   EF 55-60%   Social History   Social History Narrative  . Not on file      Objective: Vital Signs: Ht 5\' 6"  (1.676 m)   Wt 138 lb (62.6 kg)   BMI 22.27 kg/m    Physical Exam  Constitutional: She is oriented to person, place, and time. She appears well-developed and well-nourished.  HENT:  Head: Normocephalic and atraumatic.  Eyes: Conjunctivae and EOM are normal.  Neck: Normal range of motion.  Cardiovascular: Normal rate, regular rhythm, normal heart sounds and intact distal pulses.  Pulmonary/Chest: Effort normal and breath sounds normal.  Abdominal: Soft. Bowel sounds are normal.  Lymphadenopathy:    She has no cervical adenopathy.  Neurological: She is alert and oriented to person, place, and time.  Skin: Skin is warm and dry. Capillary refill takes less than 2 seconds.  Psychiatric: She has a normal mood and affect. Her behavior is normal.  Nursing note and vitals reviewed.    Musculoskeletal Exam: C-spine limited range of motion. She has thoracic kyphosis. Lumbar spine some limitation with range of motion. Shoulder joints elbow joints wrist joints with good range of motion. MCPs PIPs DIPs are good range of motion. Joints knee joints ankles MTPs PIPs DIPs are good range of motion with no synovitis.  CDAI Exam: No CDAI exam completed.    Investigation: No additional findings. CBC Latest Ref Rng & Units 10/15/2017 09/28/2017 03/29/2017  WBC 3.4 - 10.8 x10E3/uL 5.8 5.6 4.5  Hemoglobin 11.1 - 15.9 g/dL 11.7 11.6(L) 12.5  Hematocrit 34.0 - 46.6 % 37.3 36.9 39.2  Platelets 150 - 379 x10E3/uL 205 199 174   CMP Latest Ref Rng & Units 10/15/2017 09/28/2017 03/29/2017  Glucose 65 - 99 mg/dL 95 89 107(H)  BUN 10 - 36 mg/dL 27 26(H) 21  Creatinine 0.57 - 1.00 mg/dL 1.25(H) 1.35(H) 1.19(H)  Sodium 134 - 144 mmol/L 138 141 141  Potassium 3.5 - 5.2 mmol/L 4.7 4.3 4.6  Chloride 96 - 106 mmol/L 101 102 102  CO2 20 - 29 mmol/L 26 31 26   Calcium 8.7 - 10.3 mg/dL 8.5(L) 8.3(L) 8.5(L)  Total Protein 6.0 - 8.5 g/dL 5.8(L) 5.7(L) 5.8(L)  Total Bilirubin  0.0 - 1.2 mg/dL 0.4 0.5 0.6  Alkaline Phos 39 - 117 IU/L 58 - 60  AST 0 - 40 IU/L 48(H) 40(H) 41(H)  ALT 0 - 32 IU/L 53(H) 39(H) 37(H)    Imaging: No results found.  Speciality Comments: No specialty comments available.    Procedures:  No procedures performed Allergies: Patient has no known allergies.   Assessment / Plan:     Visit Diagnoses: Age-related osteoporosis without current pathological fracture - T score of -2.5. She started Fosamax 4 months ago which she's been tolerating well. She's been also taking calcium and vitamin D. Need for regular exercise and resistive exercises was discussed.  Balance issues: We had detailed discussion regarding physical therapy for osteoporosis and fall prevention. I demonstrated some of the exercises. I'll give her a prescription for physical therapy as well.  Closed compression fracture of L2 lumbar vertebra, with delayed healing, subsequent encounter - Followed up by Dr. Louanne Skye.  Chronic midline low back pain without sciatica: Associates strengthening exercises were discussed.  History of pelvic fracture: She is not having much discomfort.  Spondylolisthesis, lumbar  region  Spinal stenosis of lumbar region with neurogenic claudication  Anticoagulated on Coumadin  History of anemia  History of hypertension    Orders: Orders Placed This Encounter  Procedures  . Ambulatory referral to Physical Therapy   No orders of the defined types were placed in this encounter.   Face-to-face time spent with patient was 30 minutes.  Greater than 50% of time was spent in counseling and coordination of care.  Follow-Up Instructions: Return in about 6 months (around 04/28/2018) for Osteoarthritis.   Bo Merino, MD  Note - This record has been created using Editor, commissioning.  Chart creation errors have been sought, but may not always  have been located. Such creation errors do not reflect on  the standard of medical care.

## 2017-10-20 ENCOUNTER — Other Ambulatory Visit: Payer: Self-pay | Admitting: *Deleted

## 2017-10-20 ENCOUNTER — Telehealth: Payer: Self-pay | Admitting: Internal Medicine

## 2017-10-20 DIAGNOSIS — Z79899 Other long term (current) drug therapy: Secondary | ICD-10-CM

## 2017-10-20 DIAGNOSIS — R748 Abnormal levels of other serum enzymes: Secondary | ICD-10-CM

## 2017-10-20 NOTE — Telephone Encounter (Signed)
New Message    Patient wants you to clarify her discharge notes from her last office visit

## 2017-10-20 NOTE — Telephone Encounter (Signed)
Answered all questions to pt's satisfactory  ./cy

## 2017-10-24 ENCOUNTER — Other Ambulatory Visit: Payer: Self-pay | Admitting: Rheumatology

## 2017-10-25 ENCOUNTER — Telehealth (INDEPENDENT_AMBULATORY_CARE_PROVIDER_SITE_OTHER): Payer: Self-pay

## 2017-10-25 NOTE — Telephone Encounter (Signed)
Last Visit: 04/29/17 Next Visit: 10/29/17 Labs: 10/15/17 Stable  Okay to refill per Dr. Inge Rise

## 2017-10-25 NOTE — Telephone Encounter (Signed)
Patient advised prescription was sent to the pharmacy.

## 2017-10-25 NOTE — Telephone Encounter (Signed)
Patient calling concerning a Rx refill for Fosamax.  Cb# is [phone].  Please advise.  Thank you.

## 2017-10-29 ENCOUNTER — Ambulatory Visit: Payer: Medicare Other | Admitting: Rheumatology

## 2017-10-29 ENCOUNTER — Encounter: Payer: Self-pay | Admitting: Rheumatology

## 2017-10-29 VITALS — Ht 66.0 in | Wt 138.0 lb

## 2017-10-29 DIAGNOSIS — Z5181 Encounter for therapeutic drug level monitoring: Secondary | ICD-10-CM | POA: Diagnosis not present

## 2017-10-29 DIAGNOSIS — M4316 Spondylolisthesis, lumbar region: Secondary | ICD-10-CM | POA: Diagnosis not present

## 2017-10-29 DIAGNOSIS — Z862 Personal history of diseases of the blood and blood-forming organs and certain disorders involving the immune mechanism: Secondary | ICD-10-CM

## 2017-10-29 DIAGNOSIS — Z8679 Personal history of other diseases of the circulatory system: Secondary | ICD-10-CM | POA: Diagnosis not present

## 2017-10-29 DIAGNOSIS — S32020G Wedge compression fracture of second lumbar vertebra, subsequent encounter for fracture with delayed healing: Secondary | ICD-10-CM

## 2017-10-29 DIAGNOSIS — M545 Low back pain: Secondary | ICD-10-CM

## 2017-10-29 DIAGNOSIS — Y93B9 Activity, other involving muscle strengthening exercises: Secondary | ICD-10-CM | POA: Diagnosis not present

## 2017-10-29 DIAGNOSIS — Z8781 Personal history of (healed) traumatic fracture: Secondary | ICD-10-CM | POA: Diagnosis not present

## 2017-10-29 DIAGNOSIS — Z7901 Long term (current) use of anticoagulants: Secondary | ICD-10-CM

## 2017-10-29 DIAGNOSIS — G8929 Other chronic pain: Secondary | ICD-10-CM | POA: Diagnosis not present

## 2017-10-29 DIAGNOSIS — M48062 Spinal stenosis, lumbar region with neurogenic claudication: Secondary | ICD-10-CM

## 2017-10-29 DIAGNOSIS — M810 Age-related osteoporosis without current pathological fracture: Secondary | ICD-10-CM | POA: Diagnosis not present

## 2017-10-29 NOTE — Patient Instructions (Signed)
Please get CBC, CMP with GFR and vitamin D level in May 2019 at Dr. Delene Ruffini office. Please forward results to [place].

## 2017-12-14 ENCOUNTER — Other Ambulatory Visit: Payer: Medicare Other | Admitting: *Deleted

## 2017-12-14 DIAGNOSIS — Z79899 Other long term (current) drug therapy: Secondary | ICD-10-CM

## 2017-12-14 DIAGNOSIS — R748 Abnormal levels of other serum enzymes: Secondary | ICD-10-CM

## 2017-12-14 LAB — HEPATIC FUNCTION PANEL
ALBUMIN: 3 g/dL — AB (ref 3.2–4.6)
ALK PHOS: 63 IU/L (ref 39–117)
ALT: 37 IU/L — ABNORMAL HIGH (ref 0–32)
AST: 37 IU/L (ref 0–40)
Bilirubin Total: 0.4 mg/dL (ref 0.0–1.2)
Bilirubin, Direct: 0.12 mg/dL (ref 0.00–0.40)
Total Protein: 5.7 g/dL — ABNORMAL LOW (ref 6.0–8.5)

## 2017-12-22 ENCOUNTER — Telehealth: Payer: Self-pay | Admitting: Internal Medicine

## 2017-12-22 DIAGNOSIS — Z79899 Other long term (current) drug therapy: Secondary | ICD-10-CM

## 2017-12-22 DIAGNOSIS — I480 Paroxysmal atrial fibrillation: Secondary | ICD-10-CM

## 2017-12-22 NOTE — Telephone Encounter (Signed)
The patient is aware of her most recent liver function results. She confirms she is taking amiodarone 200 mg once daily 5 days a week (she does not take on Saturday and Sunday). She is agreeable with a repeat liver function test in 2 months per Dr. Caryl Comes.

## 2018-01-08 ENCOUNTER — Other Ambulatory Visit: Payer: Self-pay | Admitting: Internal Medicine

## 2018-01-17 ENCOUNTER — Ambulatory Visit: Payer: Medicare Other | Admitting: *Deleted

## 2018-01-17 ENCOUNTER — Telehealth: Payer: Self-pay | Admitting: Cardiology

## 2018-01-17 NOTE — Telephone Encounter (Signed)
LMOVM reminding pt to send remote transmission.   

## 2018-01-20 ENCOUNTER — Encounter: Payer: Self-pay | Admitting: Cardiology

## 2018-01-20 NOTE — Progress Notes (Signed)
Not received  

## 2018-01-22 ENCOUNTER — Other Ambulatory Visit: Payer: Self-pay | Admitting: Physician Assistant

## 2018-01-22 ENCOUNTER — Other Ambulatory Visit: Payer: Self-pay | Admitting: Rheumatology

## 2018-01-24 ENCOUNTER — Telehealth: Payer: Self-pay | Admitting: Rheumatology

## 2018-01-24 NOTE — Telephone Encounter (Signed)
Last Visit: 10/29/17 Next Visit: 02/07/18 Labs: 09/28/17 Labs are stable  Okay to refill per Dr. Estanislado Pandy

## 2018-01-24 NOTE — Telephone Encounter (Signed)
Patient called with questions regarding her Alendronate and whether she needs to continue taking the medication.

## 2018-01-25 NOTE — Telephone Encounter (Signed)
Patient wanted to know if she need to continue the Fosamax as she is out of her prescription. Patient advised that she is to continue the medication and a refill has been sent to the pharmacy.

## 2018-02-07 ENCOUNTER — Other Ambulatory Visit: Payer: Medicare Other | Admitting: *Deleted

## 2018-02-07 ENCOUNTER — Telehealth: Payer: Self-pay | Admitting: Internal Medicine

## 2018-02-07 DIAGNOSIS — I480 Paroxysmal atrial fibrillation: Secondary | ICD-10-CM

## 2018-02-07 DIAGNOSIS — Z79899 Other long term (current) drug therapy: Secondary | ICD-10-CM

## 2018-02-07 LAB — HEPATIC FUNCTION PANEL
ALBUMIN: 3 g/dL — AB (ref 3.2–4.6)
ALT: 33 IU/L — ABNORMAL HIGH (ref 0–32)
AST: 35 IU/L (ref 0–40)
Alkaline Phosphatase: 62 IU/L (ref 39–117)
Bilirubin Total: 0.3 mg/dL (ref 0.0–1.2)
Bilirubin, Direct: 0.13 mg/dL (ref 0.00–0.40)
TOTAL PROTEIN: 5.9 g/dL — AB (ref 6.0–8.5)

## 2018-02-07 NOTE — Telephone Encounter (Signed)
New message    Pt is asking for a call. She states she got two Medtronic transmission boxes and needs to talk to someone. Please call.

## 2018-02-07 NOTE — Telephone Encounter (Signed)
Ms. Anne Macias wanted to know if her monitor and her husband's monitor work the same way. They do have the same type of monitor and she is overdue to send a transmission- I made her aware to send when she can. She has company coming this week and she doesn't think she will get to it in the next couple of days, but will send when she can.

## 2018-02-11 ENCOUNTER — Telehealth: Payer: Self-pay

## 2018-02-11 MED ORDER — AMIODARONE HCL 200 MG PO TABS: 200.0000 mg | ORAL_TABLET | Freq: Every day | ORAL | 2 refills | Status: DC

## 2018-02-11 NOTE — Telephone Encounter (Signed)
-----   Message from Deboraha Sprang, MD sent at 02/11/2018  8:19 AM EDT ----- Please Inform Patient that labs are almost normal Thanks  We can change amio to 5d/w and recheck LFTs in 3 months

## 2018-02-11 NOTE — Telephone Encounter (Signed)
Encounter opened to adjust medication change. Amiodarone, 1 tablet 200mg , 5 times per week.

## 2018-02-17 ENCOUNTER — Ambulatory Visit (INDEPENDENT_AMBULATORY_CARE_PROVIDER_SITE_OTHER): Payer: Medicare Other | Admitting: *Deleted

## 2018-02-17 DIAGNOSIS — I495 Sick sinus syndrome: Secondary | ICD-10-CM

## 2018-02-18 ENCOUNTER — Encounter: Payer: Self-pay | Admitting: Cardiology

## 2018-02-18 NOTE — Progress Notes (Signed)
Remote pacemaker transmission.   

## 2018-03-11 LAB — CUP PACEART REMOTE DEVICE CHECK
Battery Voltage: 2.93 V
Brady Statistic AP VP Percent: 0.01 %
Brady Statistic RV Percent Paced: 0.01 %
Date Time Interrogation Session: 20190321193238
Implantable Lead Implant Date: 20110929
Implantable Lead Location: 753860
Implantable Pulse Generator Implant Date: 20110929
Lead Channel Impedance Value: 624 Ohm
Lead Channel Sensing Intrinsic Amplitude: 11.729 mV
Lead Channel Setting Pacing Amplitude: 2 V
Lead Channel Setting Pacing Pulse Width: 0.4 ms
MDC IDC LEAD IMPLANT DT: 20110929
MDC IDC LEAD LOCATION: 753859
MDC IDC MSMT LEADCHNL RA IMPEDANCE VALUE: 424 Ohm
MDC IDC MSMT LEADCHNL RA SENSING INTR AMPL: 1.796 mV
MDC IDC SET LEADCHNL RV PACING AMPLITUDE: 2.5 V
MDC IDC SET LEADCHNL RV SENSING SENSITIVITY: 0.9 mV
MDC IDC STAT BRADY AP VS PERCENT: 99.95 %
MDC IDC STAT BRADY AS VP PERCENT: 0 %
MDC IDC STAT BRADY AS VS PERCENT: 0.04 %
MDC IDC STAT BRADY RA PERCENT PACED: 99.96 %

## 2018-04-20 ENCOUNTER — Telehealth: Payer: Self-pay | Admitting: Rheumatology

## 2018-04-20 ENCOUNTER — Encounter: Payer: Medicare Other | Admitting: Physician Assistant

## 2018-04-20 MED ORDER — ALENDRONATE SODIUM 70 MG PO TABS: ORAL_TABLET | ORAL | 0 refills | Status: DC

## 2018-04-20 NOTE — Telephone Encounter (Signed)
Patient left a voicemail requesting prescription refill of Fosamax.  Patient's pharmacy is [facility] on [address] in [place].  Patient states she is not sure if Dr. Estanislado Pandy wanted her to refill the medication or to discontinue taking it.

## 2018-04-20 NOTE — Telephone Encounter (Signed)
Last visit: 10/29/2017 Next visit: message sent to the front desk to schedule patient.  Labs: 10/15/2017  Patient will come in next week to update labs.  Okay to refill per Dr. Estanislado Pandy.

## 2018-04-22 ENCOUNTER — Encounter: Payer: Medicare Other | Admitting: Physician Assistant

## 2018-04-26 ENCOUNTER — Ambulatory Visit: Payer: Medicare Other | Admitting: Rheumatology

## 2018-05-02 NOTE — Progress Notes (Signed)
Office Visit Note  Patient: Anne Macias             Date of Birth: 11-26-1923           MRN: [id number]             PCP: Lavone Orn, MD Referring: Lavone Orn, MD Visit Date: 05/16/2018 Occupation: @GUAROCC @    Subjective:  Medication management.   History of Present Illness: Anne Macias is a 84 y.o. female with history of osteoporosis and compression fracture.  She has been on Fosamax since last year.  She denies any side effects from Fosamax.  She does not have any joint pain or lower back discomfort currently.  Activities of Daily Living:  Patient reports morning stiffness for 0 minutes.   Patient Denies nocturnal pain.  Difficulty dressing/grooming: Denies Difficulty climbing stairs: Reports Difficulty getting out of chair: Denies Difficulty using hands for taps, buttons, cutlery, and/or writing: Denies   Review of Systems  Constitutional: Negative for fatigue.  HENT: Negative for mouth sores and mouth dryness.   Eyes: Negative for dryness.  Respiratory: Negative for shortness of breath and difficulty breathing.   Cardiovascular: Negative for chest pain and swelling in legs/feet.  Gastrointestinal: Negative for abdominal pain, constipation and diarrhea.  Endocrine: Negative for increased urination.  Genitourinary: Negative for pelvic pain.  Musculoskeletal: Negative for joint swelling and morning stiffness.  Skin: Positive for hair loss. Negative for rash.  Allergic/Immunologic: Negative for susceptible to infections.  Neurological: Negative for dizziness, light-headedness, headaches, memory loss and weakness.  Hematological: Positive for bruising/bleeding tendency.  Psychiatric/Behavioral: Negative for confusion.    PMFS History:  Patient Active Problem List   Diagnosis Date Noted  . Chronic midline low back pain without sciatica 11/05/2016  . Spinal stenosis of lumbar region with neurogenic claudication 11/05/2016  . Spondylolisthesis, lumbar  region 11/05/2016  . Pyelonephritis 07/14/2016  . Closed compression fracture of L2 lumbar vertebra, with delayed healing, subsequent encounter 07/14/2016  . UTI (urinary tract infection) 07/14/2016  . Fall 06/20/2014  . Anticoagulated on Coumadin 06/20/2014  . Hyperlipidemia 06/20/2014  . Acute blood loss anemia 06/20/2014  . Pelvic fracture ([place]) 06/19/2014  . Tremor 05/08/2014  . Renal insufficiency 03/12/2014  . Anorexia 03/12/2014  . Sinus node dysfunction ([place]) 02/17/2012  . Essential hypertension, benign 12/09/2010  . ATRIAL FIBRILLATION 12/09/2010  . PACEMAKER, PERMANENT-Medtronic REVO 12/08/2010    Past Medical History:  Diagnosis Date  . Chronic anticoagulation   . Edema of lower extremity   . Hyperlipidemia   . Hypertension   . PAF (paroxysmal atrial fibrillation) ([place]) 1998  . S/P cardiac pacemaker procedure Sept 0347   complicated by pocket hematoma  . Tachy-brady syndrome ([place])   . Tremor 05/08/2014  . Visual disturbance     Family History  Problem Relation Age of Onset  . Heart attack Mother   . Pneumonia Father   . Heart attack Brother   . Alcohol abuse Brother    Past Surgical History:  Procedure Laterality Date  . CARDIOVERSION N/A 02/26/2014   Procedure: CARDIOVERSION;  Surgeon: Deboraha Sprang, MD;  Location: [facility] ENDOSCOPY;  Service: Cardiovascular;  Laterality: N/A;  . CARDIOVERSION N/A 03/19/2014   Procedure: CARDIOVERSION;  Surgeon: Marlyss Spark, MD;  Location: [facility] ENDOSCOPY;  Service: Cardiovascular;  Laterality: N/A;  . CARDIOVERSION N/A 06/17/2015   Procedure: CARDIOVERSION;  Surgeon: Xcaret Spark, MD;  Location: [place];  Service: Cardiovascular;  Laterality: N/A;  . CATARACT EXTRACTION    .  CHOLECYSTECTOMY  2005  . INSERT / REPLACE / REMOVE PACEMAKER  08/28/2010   implantaton  . US ECHOCARDIOGRAPHY  04/10/2010   EF 55-60%   Social History   Social History Narrative  . Not on file     Objective: Vital Signs: BP (!) 167/79 (BP  Location: Left Arm, Patient Position: Sitting, Cuff Size: Normal)   Pulse 71   Resp 15   Ht 5\' 5"  (1.651 m)   Wt 142 lb (64.4 kg)   BMI 23.63 kg/m    Physical Exam  Constitutional: She is oriented to person, place, and time. She appears well-developed and well-nourished.  HENT:  Head: Normocephalic and atraumatic.  Eyes: Conjunctivae and EOM are normal.  Neck: Normal range of motion.  Cardiovascular: Normal rate, regular rhythm, normal heart sounds and intact distal pulses.  Pulmonary/Chest: Effort normal and breath sounds normal.  Abdominal: Soft. Bowel sounds are normal.  Lymphadenopathy:    She has no cervical adenopathy.  Neurological: She is alert and oriented to person, place, and time.  Skin: Skin is warm and dry. Capillary refill takes less than 2 seconds.  Psychiatric: She has a normal mood and affect. Her behavior is normal.  Nursing note and vitals reviewed.    Musculoskeletal Exam: C-spine good range of motion.  She has thoracic kyphosis.  Shoulder joints, elbow joints, wrist joints, MCPs PIPs DIPs were in good range of motion with no synovitis.  Hip joints knee joints ankles MTPs PIPs DIPs were in good range of motion with no synovitis.   CDAI Exam: No CDAI exam completed.    Investigation: No additional findings. CBC Latest Ref Rng & Units 10/15/2017 09/28/2017 03/29/2017  WBC 3.4 - 10.8 x10E3/uL 5.8 5.6 4.5  Hemoglobin 11.1 - 15.9 g/dL 11.7 11.6(L) 12.5  Hematocrit 34.0 - 46.6 % 37.3 36.9 39.2  Platelets 150 - 379 x10E3/uL 205 199 174   CMP Latest Ref Rng & Units 02/07/2018 12/14/2017 10/15/2017  Glucose 65 - 99 mg/dL - - 95  BUN 10 - 36 mg/dL - - 27  Creatinine 0.57 - 1.00 mg/dL - - 1.25(H)  Sodium 134 - 144 mmol/L - - 138  Potassium 3.5 - 5.2 mmol/L - - 4.7  Chloride 96 - 106 mmol/L - - 101  CO2 20 - 29 mmol/L - - 26  Calcium 8.7 - 10.3 mg/dL - - 8.5(L)  Total Protein 6.0 - 8.5 g/dL 5.9(L) 5.7(L) 5.8(L)  Total Bilirubin 0.0 - 1.2 mg/dL 0.3 0.4 0.4    Alkaline Phos 39 - 117 IU/L 62 63 58  AST 0 - 40 IU/L 35 37 48(H)  ALT 0 - 32 IU/L 33(H) 37(H) 53(H)    Imaging: No results found.  Speciality Comments: No specialty comments available.    Procedures:  No procedures performed Allergies: Patient has no known allergies.   Assessment / Plan:     Visit Diagnoses: Age-related osteoporosis without current pathological fracture -  DXA 05/18 T score -2.5 , Fosamax.  Patient states that she has been tolerating medication well.  She had recent labs done by her PCP which she will get  forwarded to [place].  She should get CMP with GFR along with vitamin D every 6 months.  She plans to do it through her PCP.  Use of calcium, vitamin D and muscle strengthening was discussed.  She will get repeat bone density in May 2020.  Kyphosis-stretching exercises were discussed.  Closed compression fracture of L2 lumbar vertebra, with delayed healing, subsequent encounter -  Followed up by Dr. Louanne Skye.  Spinal stenosis of lumbar region with neurogenic claudication-she is not having much discomfort currently.  Spondylolisthesis, lumbar region  History of pelvic fracture  History of hypertension  Anticoagulated on Coumadin  History of anemia    Orders: No orders of the defined types were placed in this encounter.  No orders of the defined types were placed in this encounter.   Face-to-face time spent with patient was 20 minutes. >50% of time was spent in counseling and coordination of care.  Follow-Up Instructions: Return in about 6 months (around 11/15/2018) for Osteoarthritis, Osteoporosis.   Bo Merino, MD  Note - This record has been created using Editor, commissioning.  Chart creation errors have been sought, but may not always  have been located. Such creation errors do not reflect on  the standard of medical care.

## 2018-05-16 ENCOUNTER — Ambulatory Visit: Payer: Medicare Other | Admitting: Rheumatology

## 2018-05-16 ENCOUNTER — Encounter: Payer: Self-pay | Admitting: Rheumatology

## 2018-05-16 VITALS — BP 167/79 | HR 71 | Resp 15 | Ht 65.0 in | Wt 142.0 lb

## 2018-05-16 DIAGNOSIS — M4316 Spondylolisthesis, lumbar region: Secondary | ICD-10-CM | POA: Diagnosis not present

## 2018-05-16 DIAGNOSIS — Z8781 Personal history of (healed) traumatic fracture: Secondary | ICD-10-CM

## 2018-05-16 DIAGNOSIS — Z862 Personal history of diseases of the blood and blood-forming organs and certain disorders involving the immune mechanism: Secondary | ICD-10-CM | POA: Diagnosis not present

## 2018-05-16 DIAGNOSIS — S32020G Wedge compression fracture of second lumbar vertebra, subsequent encounter for fracture with delayed healing: Secondary | ICD-10-CM | POA: Diagnosis not present

## 2018-05-16 DIAGNOSIS — Z8679 Personal history of other diseases of the circulatory system: Secondary | ICD-10-CM

## 2018-05-16 DIAGNOSIS — Z7901 Long term (current) use of anticoagulants: Secondary | ICD-10-CM

## 2018-05-16 DIAGNOSIS — M810 Age-related osteoporosis without current pathological fracture: Secondary | ICD-10-CM | POA: Diagnosis not present

## 2018-05-16 DIAGNOSIS — M48062 Spinal stenosis, lumbar region with neurogenic claudication: Secondary | ICD-10-CM | POA: Diagnosis not present

## 2018-05-18 ENCOUNTER — Encounter: Payer: Self-pay | Admitting: Physician Assistant

## 2018-05-19 ENCOUNTER — Ambulatory Visit (INDEPENDENT_AMBULATORY_CARE_PROVIDER_SITE_OTHER): Payer: Medicare Other | Admitting: *Deleted

## 2018-05-19 DIAGNOSIS — I495 Sick sinus syndrome: Secondary | ICD-10-CM

## 2018-05-19 NOTE — Progress Notes (Signed)
Remote pacemaker transmission.   

## 2018-06-07 ENCOUNTER — Encounter: Payer: Medicare Other | Admitting: Physician Assistant

## 2018-06-07 LAB — CUP PACEART REMOTE DEVICE CHECK
Brady Statistic AP VS Percent: 99.92 %
Brady Statistic AS VP Percent: 0 %
Brady Statistic RA Percent Paced: 99.93 %
Date Time Interrogation Session: 20190620143609
Implantable Lead Implant Date: 20110929
Implantable Lead Location: 753859
Lead Channel Impedance Value: 424 Ohm
Lead Channel Impedance Value: 632 Ohm
Lead Channel Sensing Intrinsic Amplitude: 10.004 mV
Lead Channel Setting Pacing Amplitude: 2.5 V
Lead Channel Setting Sensing Sensitivity: 0.9 mV
MDC IDC LEAD IMPLANT DT: 20110929
MDC IDC LEAD LOCATION: 753860
MDC IDC MSMT BATTERY VOLTAGE: 2.93 V
MDC IDC MSMT LEADCHNL RA SENSING INTR AMPL: 1.753 mV
MDC IDC PG IMPLANT DT: 20110929
MDC IDC SET LEADCHNL RA PACING AMPLITUDE: 2 V
MDC IDC SET LEADCHNL RV PACING PULSEWIDTH: 0.4 ms
MDC IDC STAT BRADY AP VP PERCENT: 0.01 %
MDC IDC STAT BRADY AS VS PERCENT: 0.07 %
MDC IDC STAT BRADY RV PERCENT PACED: 0.01 %

## 2018-07-11 ENCOUNTER — Other Ambulatory Visit: Payer: Self-pay | Admitting: Rheumatology

## 2018-07-11 DIAGNOSIS — M810 Age-related osteoporosis without current pathological fracture: Secondary | ICD-10-CM

## 2018-07-11 NOTE — Telephone Encounter (Signed)
Last visit: 05/16/2018 Next visit: 110/26/202019 Labs: 10/15/2017  Advised patient she is due to update labs. Patient states she will come in one day this week to have them drawn. Lab orders have been placed.   Okay to refill 30 day supply, per Dr. Estanislado Pandy.

## 2018-07-12 ENCOUNTER — Other Ambulatory Visit: Payer: Self-pay

## 2018-07-12 DIAGNOSIS — M810 Age-related osteoporosis without current pathological fracture: Secondary | ICD-10-CM

## 2018-07-13 LAB — COMPLETE METABOLIC PANEL WITH GFR
AG Ratio: 0.9 (calc) — ABNORMAL LOW (ref 1.0–2.5)
ALBUMIN MSPROF: 2.9 g/dL — AB (ref 3.6–5.1)
ALKALINE PHOSPHATASE (APISO): 57 U/L (ref 33–130)
ALT: 19 U/L (ref 6–29)
AST: 22 U/L (ref 10–35)
BILIRUBIN TOTAL: 0.4 mg/dL (ref 0.2–1.2)
BUN / CREAT RATIO: 20 (calc) (ref 6–22)
BUN: 29 mg/dL — ABNORMAL HIGH (ref 7–25)
CO2: 33 mmol/L — AB (ref 20–32)
CREATININE: 1.47 mg/dL — AB (ref 0.60–0.88)
Calcium: 8.5 mg/dL — ABNORMAL LOW (ref 8.6–10.4)
Chloride: 102 mmol/L (ref 98–110)
GFR, EST AFRICAN AMERICAN: 35 mL/min/{1.73_m2} — AB (ref >=60)
GFR, Est Non African American: 30 mL/min/{1.73_m2} — ABNORMAL LOW (ref >=60)
GLOBULIN: 3.2 g/dL (ref 1.9–3.7)
Glucose, Bld: 112 mg/dL — ABNORMAL HIGH (ref 65–99)
Potassium: 5 mmol/L (ref 3.5–5.3)
SODIUM: 141 mmol/L (ref 135–146)
TOTAL PROTEIN: 6.1 g/dL (ref 6.1–8.1)

## 2018-07-13 LAB — CBC WITH DIFFERENTIAL/PLATELET
Basophils Absolute: 31 cells/uL (ref 0–200)
Basophils Relative: 0.5 %
Eosinophils Absolute: 525 cells/uL — ABNORMAL HIGH (ref 15–500)
Eosinophils Relative: 8.6 %
HEMATOCRIT: 37.8 % (ref 35.0–45.0)
Hemoglobin: 11.8 g/dL (ref 11.7–15.5)
LYMPHS ABS: 2147 {cells}/uL (ref 850–3900)
MCH: 26.5 pg — ABNORMAL LOW (ref 27.0–33.0)
MCHC: 31.2 g/dL — AB (ref 32.0–36.0)
MCV: 84.9 fL (ref 80.0–100.0)
MPV: 10 fL (ref 7.5–12.5)
Monocytes Relative: 8.4 %
Neutro Abs: 2885 cells/uL (ref 1500–7800)
Neutrophils Relative %: 47.3 %
PLATELETS: 220 10*3/uL (ref 140–400)
RBC: 4.45 10*6/uL (ref 3.80–5.10)
RDW: 15.9 % — ABNORMAL HIGH (ref 11.0–15.0)
TOTAL LYMPHOCYTE: 35.2 %
WBC: 6.1 10*3/uL (ref 3.8–10.8)
WBCMIX: 512 {cells}/uL (ref 200–950)

## 2018-07-13 NOTE — Progress Notes (Signed)
Increased Cr. Pt. is on Lasix.

## 2018-07-19 ENCOUNTER — Ambulatory Visit: Payer: Medicare Other | Admitting: Physician Assistant

## 2018-07-19 VITALS — Ht 65.0 in | Wt 135.0 lb

## 2018-07-19 DIAGNOSIS — I480 Paroxysmal atrial fibrillation: Secondary | ICD-10-CM | POA: Diagnosis not present

## 2018-07-19 DIAGNOSIS — R609 Edema, unspecified: Secondary | ICD-10-CM

## 2018-07-19 DIAGNOSIS — I4891 Unspecified atrial fibrillation: Secondary | ICD-10-CM

## 2018-07-19 DIAGNOSIS — I10 Essential (primary) hypertension: Secondary | ICD-10-CM | POA: Diagnosis not present

## 2018-07-19 DIAGNOSIS — Z950 Presence of cardiac pacemaker: Secondary | ICD-10-CM | POA: Diagnosis not present

## 2018-07-19 LAB — TSH: TSH: 3.84 u[IU]/mL (ref 0.450–4.500)

## 2018-07-19 MED ORDER — POTASSIUM CHLORIDE CRYS ER 10 MEQ PO TBCR: 10.0000 meq | EXTENDED_RELEASE_TABLET | ORAL | 6 refills | Status: DC

## 2018-07-19 MED ORDER — FUROSEMIDE 40 MG PO TABS: 40.0000 mg | ORAL_TABLET | ORAL | Status: DC

## 2018-07-19 NOTE — Patient Instructions (Addendum)
Medication Instructions:   Your physician recommends that you continue on your current medications as directed. Please refer to the Current Medication list given to you today.   If you need a refill on your cardiac medications before your next appointment, please call your pharmacy.  Labwork:  TSH TODAY   BMET IN ONE MONTH     Testing/Procedures: NONE ORDERED  TODAY    Follow-Up:  Your physician wants you to follow-up in: [place] will receive a reminder letter in the mail two months in advance. If you don't receive a letter, please call our office to schedule the follow-up appointment.   Remote monitoring is used to monitor your Pacemaker of ICD from home. This monitoring reduces the number of office visits required to check your device to one time per year. It allows [place] to keep an eye on the functioning of your device to ensure it is working properly. You are scheduled for a device check from home on .  9-19-19You may send your transmission at any time that day. If you have a wireless device, the transmission will be sent automatically. After your physician reviews your transmission, you will receive a postcard with your next transmission date. ]    Any Other Special Instructions Will Be Listed Below (If Applicable).

## 2018-07-19 NOTE — Progress Notes (Signed)
Cardiology Office Note Date:  07/19/2018  Patient ID:  Anne, Macias [birth year], MRN [id number] PCP:  Lavone Orn, MD  Cardiologist/Electrophysiologist: Dr. Caryl Comes    Chief Complaint: routine/planned visit   History of Present Illness: Anne Macias is a 84 y.o. female with PMHx of  persistent AF, HTN, HLD, tachy-brady w/PPM, comes to the office to be seen for Dr. Caryl Comes.     I saw her in oct 2017, She was feeling well, denied any palpitations, hadn't felt like she had any AF in quite a while, no CP or SOB, no dizziness, near syncope or syncope.  She denied any bleeding or signs of bleeding.  She had chronic feet/ankle swelling, resolved upon waking, increases through the day, unchanged.  She was concerned about the appearance of her feet, she denies pain, itching, saw her PMD a week ago and started on Triamcinolone Cream she says for "something like athlete's foot" but wanted to make sure we agreed, she was recommended to see a dermatologist.  She was also only taking her lasix PRN, and instructed to take it/K+ daily.  She saw Dr. Caryl Comes Nov 2018, she was doing well, her amiodarone reduced, mentioning prehaps some balance issues.  She is accompanied by her husband of 40 years, they live independently.  She doesn't do the "heavy stuff" any more but otherwise no trouble with her ADLs, she denies any difficulty with balance or ambulation, no CP, palpitations or SOB, no dizziness, near syncope or syncope.  She feels like she is doing well.  She continues on warfarin, denies any bleeding or signs of bleeding, managed with her PMD, denies any issues with control, has been "stable".  She enjoys journal-ing, has keep written record of her life with her husband as well as her childern and  grandchildern   AFib hx: Persistent DCCV x3 Current on Amiodarone Intolerant of Flecainide and propafenone Warfarin  Device history: MDT dual chamber PPM, implanted 08/28/10, Dr. Doreatha Lew,  SSSx  Past Medical History:  Diagnosis Date  . Chronic anticoagulation   . Edema of lower extremity   . Hyperlipidemia   . Hypertension   . PAF (paroxysmal atrial fibrillation) ([place]) 1998  . S/P cardiac pacemaker procedure Sept 6256   complicated by pocket hematoma  . Tachy-brady syndrome ([place])   . Tremor 05/08/2014  . Visual disturbance     Past Surgical History:  Procedure Laterality Date  . CARDIOVERSION N/A 02/26/2014   Procedure: CARDIOVERSION;  Surgeon: Deboraha Sprang, MD;  Location: [facility] ENDOSCOPY;  Service: Cardiovascular;  Laterality: N/A;  . CARDIOVERSION N/A 03/19/2014   Procedure: CARDIOVERSION;  Surgeon: Sakia Spark, MD;  Location: [facility] ENDOSCOPY;  Service: Cardiovascular;  Laterality: N/A;  . CARDIOVERSION N/A 06/17/2015   Procedure: CARDIOVERSION;  Surgeon: Kealohilani Spark, MD;  Location: [place];  Service: Cardiovascular;  Laterality: N/A;  . CATARACT EXTRACTION    . CHOLECYSTECTOMY  2005  . INSERT / REPLACE / REMOVE PACEMAKER  08/28/2010   implantaton  . US ECHOCARDIOGRAPHY  04/10/2010   EF 55-60%    Current Outpatient Medications  Medication Sig Dispense Refill  . alendronate (FOSAMAX) 70 MG tablet TAKE 1 TABLET BY MOUTH ONCE A WEEK WITH A FULL GLASS OF WATER ON AN EMPTY STOMACH 4 tablet 0  . amiodarone (PACERONE) 200 MG tablet Take 1 tablet (200 mg total) by mouth daily. Take 1 tablet, 200mg , 5 days per week. 90 tablet 2  . Calcium Carbonate-Vitamin D (CALTRATE 600+D PO) Take 2 tablets  by mouth daily.      . furosemide (LASIX) 40 MG tablet Take 1 tablet (40 mg total) by mouth daily. 30 tablet 6  . furosemide (LASIX) 80 MG tablet Take 80 mg by mouth daily.  11  . KLOR-CON M20 20 MEQ tablet Take 20 mEq by mouth daily. with food  11  . metoprolol succinate (TOPROL-XL) 100 MG 24 hr tablet Take 1 tablet (100 mg total) by mouth 2 (two) times daily. 180 tablet 3  . Polyethyl Glycol-Propyl Glycol (SYSTANE OP) Place 1 drop into both eyes 2 (two) times daily.    .  potassium chloride (K-DUR,KLOR-CON) 10 MEQ tablet Take 1 tablet (10 mEq total) by mouth daily. 30 tablet 6  . warfarin (COUMADIN) 2 MG tablet Take 1-2 mg by mouth daily. Take 1mg  by mouth on Wednesday and Saturday. On all other days take 2 mg once daily.  4   No current facility-administered medications for this visit.     Allergies:   Patient has no known allergies.   Social History:  The patient  reports that she has never smoked. She has never used smokeless tobacco. She reports that she drinks about 7.0 standard drinks of alcohol per week. She reports that she does not use drugs.   Family History:  The patient's family history includes Alcohol abuse in her brother; Heart attack in her brother and mother; Pneumonia in her father.  ROS:  Please see the history of present illness.   All other systems are reviewed and otherwise negative.   PHYSICAL EXAM:  VS:  There were no vitals taken for this visit. BMI: There is no height or weight on file to calculate BMI. Well nourished, well developed, in no acute distress  HEENT: normocephalic, atraumatic  Neck: no JVD, carotid bruits or masses Cardiac:  RRR; no significant murmurs, no rubs, or gallops Lungs:  CTA b/l, no wheezing, rhonchi or rales  Abd: soft, nontender MS: no deformity, age appropriate atrophy Ext: no edema  Skin: she is wearing compression stockings, trace edema is appreciated Neuro:  No gross deficits appreciated Psych: euthymic mood, full affect  PPM site is stable, no tethering or discomfort   EKG:  Not done today PPM interrogation today and reviewed by myself: battery and lead measurements are good, she AP/VS 99.9% (VP <0.1%), battery voltage 2.92 (RRT 2.81V), MDT estimates 1 year  02/16/14: TTE Study Conclusion - Left ventricle: The cavity size was normal. Systolic function was normal. The estimated ejection fraction was in the range of 55% to 60%. Wall motion was normal; there were no regional wall motion  abnormalities. - Mitral valve: Mild to moderate regurgitation. - Left atrium: The atrium was mildly dilated. - Right atrium: The atrium was mildly dilated. - Tricuspid valve: Mild-moderate regurgitation. - Pulmonary arteries: Systolic pressure was mildly to moderately increased. PA peak pressure: 71mm Hg (S).  Recent Labs: 07/12/2018: ALT 19; BUN 29; Creat 1.47; Hemoglobin 11.8; Platelets 220; Potassium 5.0; Sodium 141  No results found for requested labs within last 8760 hours.   CrCl cannot be calculated (Unknown ideal weight.).   Wt Readings from Last 3 Encounters:  05/16/18 142 lb (64.4 kg)  10/29/17 138 lb (62.6 kg)  10/15/17 139 lb (63 kg)     Other studies reviewed: Additional studies/records reviewed today include: summarized above  ASSESSMENT AND PLAN:  1. Persistent AFib     CHA2DS2Vasc is at least 4, on warfarin, monitored and managed with her PMD     amio  07/12/18 LFTs OK, will get TSH today, no SOB, no neuro symptoms.      2. PPM     stable device function  3. HTN     No change today  4. Chronic edema     Creat up some last week, K+ 5.0, I have asked her to come next month for BMET    Disposition:  Continue Q 3 month remotes, see her back in 1 year, she likely will be nearing RRT by then.     Current medicines are reviewed at length with the patient today.  The patient did not have any concerns regarding medicines.  Haywood Lasso, PA-C 07/19/2018 8:46 AM     [address] [place] [address] [phone] (office)  [phone] (fax)

## 2018-07-23 ENCOUNTER — Other Ambulatory Visit: Payer: Self-pay | Admitting: Rheumatology

## 2018-07-25 NOTE — Telephone Encounter (Signed)
Last visit: 05/16/2018 Next visit: 11/16/2018 Labs: 07/12/18  Increased Cr. Pt. is on Lasix.  Okay to refill per Dr. Deveshwar 

## 2018-08-18 ENCOUNTER — Ambulatory Visit (INDEPENDENT_AMBULATORY_CARE_PROVIDER_SITE_OTHER): Payer: Medicare Other | Admitting: *Deleted

## 2018-08-18 DIAGNOSIS — I495 Sick sinus syndrome: Secondary | ICD-10-CM | POA: Diagnosis not present

## 2018-08-18 NOTE — Progress Notes (Signed)
Remote pacemaker transmission.   

## 2018-08-24 ENCOUNTER — Other Ambulatory Visit: Payer: Medicare Other | Admitting: *Deleted

## 2018-08-24 DIAGNOSIS — I480 Paroxysmal atrial fibrillation: Secondary | ICD-10-CM

## 2018-08-24 LAB — BASIC METABOLIC PANEL
BUN / CREAT RATIO: 16 (ref 12–28)
BUN: 17 mg/dL (ref 10–36)
CO2: 26 mmol/L (ref 20–29)
Calcium: 8.4 mg/dL — ABNORMAL LOW (ref 8.7–10.3)
Chloride: 101 mmol/L (ref 96–106)
Creatinine, Ser: 1.06 mg/dL — ABNORMAL HIGH (ref 0.57–1.00)
GFR, EST AFRICAN AMERICAN: 52 mL/min/{1.73_m2} — AB (ref >59)
GFR, EST NON AFRICAN AMERICAN: 45 mL/min/{1.73_m2} — AB (ref >59)
Glucose: 101 mg/dL — ABNORMAL HIGH (ref 65–99)
POTASSIUM: 4.7 mmol/L (ref 3.5–5.2)
Sodium: 140 mmol/L (ref 134–144)

## 2018-08-31 LAB — CUP PACEART REMOTE DEVICE CHECK
Battery Voltage: 2.92 V
Brady Statistic AP VP Percent: 0.01 %
Brady Statistic AS VP Percent: 0 %
Brady Statistic AS VS Percent: 0.02 %
Date Time Interrogation Session: 20190919133505
Implantable Lead Implant Date: 20110929
Implantable Lead Location: 753859
Implantable Lead Location: 753860
Implantable Pulse Generator Implant Date: 20110929
Lead Channel Impedance Value: 656 Ohm
Lead Channel Sensing Intrinsic Amplitude: 1.54 mV
Lead Channel Sensing Intrinsic Amplitude: 11.039 mV
Lead Channel Setting Pacing Amplitude: 2 V
Lead Channel Setting Pacing Amplitude: 2.5 V
Lead Channel Setting Pacing Pulse Width: 0.4 ms
Lead Channel Setting Sensing Sensitivity: 0.9 mV
MDC IDC LEAD IMPLANT DT: 20110929
MDC IDC MSMT LEADCHNL RA IMPEDANCE VALUE: 424 Ohm
MDC IDC STAT BRADY AP VS PERCENT: 99.97 %
MDC IDC STAT BRADY RA PERCENT PACED: 99.97 %
MDC IDC STAT BRADY RV PERCENT PACED: 0.01 %

## 2018-10-26 ENCOUNTER — Other Ambulatory Visit: Payer: Self-pay | Admitting: Rheumatology

## 2018-10-26 NOTE — Telephone Encounter (Signed)
Last visit: 05/16/2018 Next visit: 1September 01, 202019 Labs: 07/12/18  Increased Cr. Pt. is on Lasix.  Okay to refill per Dr. Estanislado Pandy

## 2018-11-02 NOTE — Progress Notes (Deleted)
Office Visit Note  Patient: Anne Macias             Date of Birth: [birth year]           MRN: [id number]             PCP: Lavone Orn, MD Referring: Lavone Orn, MD Visit Date: 1Jun 03, 202019 Occupation: @GUAROCC @  Subjective:  No chief complaint on file.  Currently on Fosamax (started in May 2018). DEXA on 03/2017 showed T score -2.5.   BMP from 08/24/2018 showed improvement in kidney function with GFR increased to 45 from 30.  We will continue to monitor.  Last vitamin D level was 38 on 09/28/2017.  History of Present Illness: Anne Macias is a 84 y.o. female  with history of osteoporosis and compression fracture.    Activities of Daily Living:  Patient reports morning stiffness for *** {minute/hour:19697}.   Patient {ACTIONS;DENIES/REPORTS:21021675::"Denies"} nocturnal pain.  Difficulty dressing/grooming: {ACTIONS;DENIES/REPORTS:21021675::"Denies"} Difficulty climbing stairs: {ACTIONS;DENIES/REPORTS:21021675::"Denies"} Difficulty getting out of chair: {ACTIONS;DENIES/REPORTS:21021675::"Denies"} Difficulty using hands for taps, buttons, cutlery, and/or writing: {ACTIONS;DENIES/REPORTS:21021675::"Denies"}  No Rheumatology ROS completed.   PMFS History:  Patient Active Problem List   Diagnosis Date Noted  . Chronic midline low back pain without sciatica 11/05/2016  . Spinal stenosis of lumbar region with neurogenic claudication 11/05/2016  . Spondylolisthesis, lumbar region 11/05/2016  . Pyelonephritis 07/14/2016  . Closed compression fracture of L2 lumbar vertebra, with delayed healing, subsequent encounter 07/14/2016  . UTI (urinary tract infection) 07/14/2016  . Fall 06/20/2014  . Anticoagulated on Coumadin 06/20/2014  . Hyperlipidemia 06/20/2014  . Acute blood loss anemia 06/20/2014  . Pelvic fracture ([place]) 06/19/2014  . Tremor 05/08/2014  . Renal insufficiency 03/12/2014  . Anorexia 03/12/2014  . Sinus node dysfunction ([place]) 02/17/2012  . Essential  hypertension, benign 12/09/2010  . ATRIAL FIBRILLATION 12/09/2010  . PACEMAKER, PERMANENT-Medtronic REVO 12/08/2010    Past Medical History:  Diagnosis Date  . Chronic anticoagulation   . Edema of lower extremity   . Hyperlipidemia   . Hypertension   . PAF (paroxysmal atrial fibrillation) ([place]) 1998  . S/P cardiac pacemaker procedure Sept 9735   complicated by pocket hematoma  . Tachy-brady syndrome ([place])   . Tremor 05/08/2014  . Visual disturbance     Family History  Problem Relation Age of Onset  . Heart attack Mother   . Pneumonia Father   . Heart attack Brother   . Alcohol abuse Brother    Past Surgical History:  Procedure Laterality Date  . CARDIOVERSION N/A 02/26/2014   Procedure: CARDIOVERSION;  Surgeon: Deboraha Sprang, MD;  Location: [facility] ENDOSCOPY;  Service: Cardiovascular;  Laterality: N/A;  . CARDIOVERSION N/A 03/19/2014   Procedure: CARDIOVERSION;  Surgeon: Carroll Spark, MD;  Location: [facility] ENDOSCOPY;  Service: Cardiovascular;  Laterality: N/A;  . CARDIOVERSION N/A 06/17/2015   Procedure: CARDIOVERSION;  Surgeon: Antonette Spark, MD;  Location: [place];  Service: Cardiovascular;  Laterality: N/A;  . CATARACT EXTRACTION    . CHOLECYSTECTOMY  2005  . INSERT / REPLACE / REMOVE PACEMAKER  08/28/2010   implantaton  . US ECHOCARDIOGRAPHY  04/10/2010   EF 55-60%   Social History   Social History Narrative  . Not on file    Objective: Vital Signs: There were no vitals taken for this visit.   Physical Exam   Musculoskeletal Exam: ***  CDAI Exam: CDAI Score: Not documented Patient Global Assessment: Not documented; Provider Global Assessment: Not documented Swollen: Not documented; Tender: Not documented Joint Exam  Not documented   There is currently no information documented on the homunculus. Go to the Rheumatology activity and complete the homunculus joint exam.  Investigation: No additional findings.  Imaging: No results found.  Recent  Labs: Lab Results  Component Value Date   WBC 6.1 07/12/2018   HGB 11.8 07/12/2018   PLT 220 07/12/2018   NA 140 08/24/2018   K 4.7 08/24/2018   CL 101 08/24/2018   CO2 26 08/24/2018   GLUCOSE 101 (H) 08/24/2018   BUN 17 08/24/2018   CREATININE 1.06 (H) 08/24/2018   BILITOT 0.4 07/12/2018   ALKPHOS 62 02/07/2018   AST 22 07/12/2018   ALT 19 07/12/2018   PROT 6.1 07/12/2018   ALBUMIN 3.0 (L) 02/07/2018   CALCIUM 8.4 (L) 08/24/2018   GFRAA 52 (L) 08/24/2018    Speciality Comments: No specialty comments available.  Procedures:  No procedures performed Allergies: Patient has no known allergies.   Assessment / Plan:     Visit Diagnoses: No diagnosis found.   Orders: No orders of the defined types were placed in this encounter.  No orders of the defined types were placed in this encounter.   Face-to-face time spent with patient was *** minutes. Greater than 50% of time was spent in counseling and coordination of care.  Follow-Up Instructions: No follow-ups on file.   Earnestine Mealing, CMA  Note - This record has been created using Editor, commissioning.  Chart creation errors have been sought, but may not always  have been located. Such creation errors do not reflect on  the standard of medical care.

## 2018-11-16 ENCOUNTER — Ambulatory Visit: Payer: Medicare Other | Admitting: Rheumatology

## 2018-11-17 ENCOUNTER — Ambulatory Visit (INDEPENDENT_AMBULATORY_CARE_PROVIDER_SITE_OTHER): Payer: Medicare Other

## 2018-11-17 DIAGNOSIS — I495 Sick sinus syndrome: Secondary | ICD-10-CM | POA: Diagnosis not present

## 2018-11-17 NOTE — Progress Notes (Signed)
Remote pacemaker transmission.   

## 2018-12-12 ENCOUNTER — Telehealth: Payer: Self-pay | Admitting: Internal Medicine

## 2018-12-12 NOTE — Telephone Encounter (Signed)
Spoke with pt and her husband. Today she feels SOB with some fatigue. She does not know if she has had any weight gain and she states her heart rate is in her normal range. Pt has been taking 40mg  lasix qd, but her PCP has recommended she take 80mg  qod. I advised pt and her husband for her to take her full dose of 80mg  of lasix today. If her symptoms are relieved, she will start back on her 40mg  lasix tomorrow. She will call the office back if she does not see an improvement by the end of the week.  Pt and her husband verbalized understanding and had no additional question.

## 2018-12-12 NOTE — Telephone Encounter (Signed)
New message    Pt c/o Shortness Of Breath: STAT if SOB developed within the last 24 hours or pt is noticeably SOB on the phone  1. Are you currently SOB (can you hear that pt is SOB on the phone)? no  2. How long have you been experiencing SOB? Couple of weeks but today more noticeable   3. Are you SOB when sitting or when up moving around? Moving around   4. Are you currently experiencing any other symptoms? No,.  Pt just stated that she is having trouble walking  for example walking down the hall to bathroom.

## 2018-12-13 NOTE — Progress Notes (Deleted)
Office Visit Note  Patient: Anne Macias             Date of Birth: 05-27-1923           MRN: [id number]             PCP: Lavone Orn, MD Referring: Lavone Orn, MD Visit Date: 12/27/2018 Occupation: @GUAROCC @  Subjective:  No chief complaint on file.   History of Present Illness: Anne Macias is a 85 y.o. female ***   Activities of Daily Living:  Patient reports morning stiffness for *** {minute/hour:19697}.   Patient {ACTIONS;DENIES/REPORTS:21021675::"Denies"} nocturnal pain.  Difficulty dressing/grooming: {ACTIONS;DENIES/REPORTS:21021675::"Denies"} Difficulty climbing stairs: {ACTIONS;DENIES/REPORTS:21021675::"Denies"} Difficulty getting out of chair: {ACTIONS;DENIES/REPORTS:21021675::"Denies"} Difficulty using hands for taps, buttons, cutlery, and/or writing: {ACTIONS;DENIES/REPORTS:21021675::"Denies"}  No Rheumatology ROS completed.   PMFS History:  Patient Active Problem List   Diagnosis Date Noted  . Chronic midline low back pain without sciatica 11/05/2016  . Spinal stenosis of lumbar region with neurogenic claudication 11/05/2016  . Spondylolisthesis, lumbar region 11/05/2016  . Pyelonephritis 07/14/2016  . Closed compression fracture of L2 lumbar vertebra, with delayed healing, subsequent encounter 07/14/2016  . UTI (urinary tract infection) 07/14/2016  . Fall 06/20/2014  . Anticoagulated on Coumadin 06/20/2014  . Hyperlipidemia 06/20/2014  . Acute blood loss anemia 06/20/2014  . Pelvic fracture ([place]) 06/19/2014  . Tremor 05/08/2014  . Renal insufficiency 03/12/2014  . Anorexia 03/12/2014  . Sinus node dysfunction ([place]) 02/17/2012  . Essential hypertension, benign 12/09/2010  . ATRIAL FIBRILLATION 12/09/2010  . PACEMAKER, PERMANENT-[employer]tronic REVO 12/08/2010    Past Medical History:  Diagnosis Date  . Chronic anticoagulation   . Edema of lower extremity   . Hyperlipidemia   . Hypertension   . PAF (paroxysmal atrial fibrillation) ([place]) 1998    . S/P cardiac pacemaker procedure Sept 5462   complicated by pocket hematoma  . Tachy-brady syndrome ([place])   . Tremor 05/08/2014  . Visual disturbance     Family History  Problem Relation Age of Onset  . Heart attack Mother   . Pneumonia Father   . Heart attack Brother   . Alcohol abuse Brother    Past Surgical History:  Procedure Laterality Date  . CARDIOVERSION N/A 02/26/2014   Procedure: CARDIOVERSION;  Surgeon: Deboraha Sprang, MD;  Location: [facility] ENDOSCOPY;  Service: Cardiovascular;  Laterality: N/A;  . CARDIOVERSION N/A 03/19/2014   Procedure: CARDIOVERSION;  Surgeon: Oluchi Spark, MD;  Location: [facility] ENDOSCOPY;  Service: Cardiovascular;  Laterality: N/A;  . CARDIOVERSION N/A 06/17/2015   Procedure: CARDIOVERSION;  Surgeon: Fotini Spark, MD;  Location: [place];  Service: Cardiovascular;  Laterality: N/A;  . CATARACT EXTRACTION    . CHOLECYSTECTOMY  2005  . INSERT / REPLACE / REMOVE PACEMAKER  08/28/2010   implantaton  . US ECHOCARDIOGRAPHY  04/10/2010   EF 55-60%   Social History   Social History Narrative  . Not on file    There is no immunization history on file for this patient.   Objective: Vital Signs: There were no vitals taken for this visit.   Physical Exam   Musculoskeletal Exam: ***  CDAI Exam: CDAI Score: Not documented Patient Global Assessment: Not documented; Provider Global Assessment: Not documented Swollen: Not documented; Tender: Not documented Joint Exam   Not documented   There is currently no information documented on the homunculus. Go to the Rheumatology activity and complete the homunculus joint exam.  Investigation: No additional findings.  Imaging: No results found.  Recent Labs: Lab Results  Component Value Date   WBC 6.1 07/12/2018   HGB 11.8 07/12/2018   PLT 220 07/12/2018   NA 140 08/24/2018   K 4.7 08/24/2018   CL 101 08/24/2018   CO2 26 08/24/2018   GLUCOSE 101 (H) 08/24/2018   BUN 17 08/24/2018    CREATININE 1.06 (H) 08/24/2018   BILITOT 0.4 07/12/2018   ALKPHOS 62 02/07/2018   AST 22 07/12/2018   ALT 19 07/12/2018   PROT 6.1 07/12/2018   ALBUMIN 3.0 (L) 02/07/2018   CALCIUM 8.4 (L) 08/24/2018   GFRAA 52 (L) 08/24/2018    Speciality Comments: No specialty comments available.  Procedures:  No procedures performed Allergies: Patient has no known allergies.   Assessment / Plan:     Visit Diagnoses: No diagnosis found.   Orders: No orders of the defined types were placed in this encounter.  No orders of the defined types were placed in this encounter.   Face-to-face time spent with patient was *** minutes. Greater than 50% of time was spent in counseling and coordination of care.  Follow-Up Instructions: No follow-ups on file.   Earnestine Mealing, CMA  Note - This record has been created using Editor, commissioning.  Chart creation errors have been sought, but may not always  have been located. Such creation errors do not reflect on  the standard of medical care.

## 2018-12-15 ENCOUNTER — Telehealth: Payer: Self-pay | Admitting: Internal Medicine

## 2018-12-15 NOTE — Progress Notes (Deleted)
Office Visit Note  Patient: Anne Macias             Date of Birth: September 15, 1923           MRN: [id number]             PCP: Lavone Orn, MD Referring: Lavone Orn, MD Visit Date: 12/27/2018 Occupation: @GUAROCC @  Subjective:  No chief complaint on file.  Fosamax 70 mg weekly restarted in May 2018. History of vertebral fracture. DXA on 05/18 showed T score of -2.5 at right femur neck.    History of Present Illness: Anne Macias is a 85 y.o. female ***   Activities of Daily Living:  Patient reports morning stiffness for *** {minute/hour:19697}.   Patient {ACTIONS;DENIES/REPORTS:21021675::"Denies"} nocturnal pain.  Difficulty dressing/grooming: {ACTIONS;DENIES/REPORTS:21021675::"Denies"} Difficulty climbing stairs: {ACTIONS;DENIES/REPORTS:21021675::"Denies"} Difficulty getting out of chair: {ACTIONS;DENIES/REPORTS:21021675::"Denies"} Difficulty using hands for taps, buttons, cutlery, and/or writing: {ACTIONS;DENIES/REPORTS:21021675::"Denies"}  No Rheumatology ROS completed.   PMFS History:  Patient Active Problem List   Diagnosis Date Noted  . Chronic midline low back pain without sciatica 11/05/2016  . Spinal stenosis of lumbar region with neurogenic claudication 11/05/2016  . Spondylolisthesis, lumbar region 11/05/2016  . Pyelonephritis 07/14/2016  . Closed compression fracture of L2 lumbar vertebra, with delayed healing, subsequent encounter 07/14/2016  . UTI (urinary tract infection) 07/14/2016  . Fall 06/20/2014  . Anticoagulated on Coumadin 06/20/2014  . Hyperlipidemia 06/20/2014  . Acute blood loss anemia 06/20/2014  . Pelvic fracture ([place]) 06/19/2014  . Tremor 05/08/2014  . Renal insufficiency 03/12/2014  . Anorexia 03/12/2014  . Sinus node dysfunction ([place]) 02/17/2012  . Essential hypertension, benign 12/09/2010  . ATRIAL FIBRILLATION 12/09/2010  . PACEMAKER, PERMANENT-[employer]tronic REVO 12/08/2010    Past Medical History:  Diagnosis Date  . Chronic  anticoagulation   . Edema of lower extremity   . Hyperlipidemia   . Hypertension   . PAF (paroxysmal atrial fibrillation) ([place]) 1998  . S/P cardiac pacemaker procedure Sept 0865   complicated by pocket hematoma  . Tachy-brady syndrome ([place])   . Tremor 05/08/2014  . Visual disturbance     Family History  Problem Relation Age of Onset  . Heart attack Mother   . Pneumonia Father   . Heart attack Brother   . Alcohol abuse Brother    Past Surgical History:  Procedure Laterality Date  . CARDIOVERSION N/A 02/26/2014   Procedure: CARDIOVERSION;  Surgeon: Deboraha Sprang, MD;  Location: [facility] ENDOSCOPY;  Service: Cardiovascular;  Laterality: N/A;  . CARDIOVERSION N/A 03/19/2014   Procedure: CARDIOVERSION;  Surgeon: Suzan Spark, MD;  Location: [facility] ENDOSCOPY;  Service: Cardiovascular;  Laterality: N/A;  . CARDIOVERSION N/A 06/17/2015   Procedure: CARDIOVERSION;  Surgeon: Meleena Spark, MD;  Location: [place];  Service: Cardiovascular;  Laterality: N/A;  . CATARACT EXTRACTION    . CHOLECYSTECTOMY  2005  . INSERT / REPLACE / REMOVE PACEMAKER  08/28/2010   implantaton  . US ECHOCARDIOGRAPHY  04/10/2010   EF 55-60%   Social History   Social History Narrative  . Not on file    There is no immunization history on file for this patient.   Objective: Vital Signs: There were no vitals taken for this visit.   Physical Exam   Musculoskeletal Exam: ***  CDAI Exam: CDAI Score: Not documented Patient Global Assessment: Not documented; Provider Global Assessment: Not documented Swollen: Not documented; Tender: Not documented Joint Exam   Not documented   There is currently no information documented on the homunculus. Go  to the Rheumatology activity and complete the homunculus joint exam.  Investigation: No additional findings.  Imaging: No results found.  Recent Labs: Lab Results  Component Value Date   WBC 6.1 07/12/2018   HGB 11.8 07/12/2018   PLT 220 07/12/2018   NA  140 08/24/2018   K 4.7 08/24/2018   CL 101 08/24/2018   CO2 26 08/24/2018   GLUCOSE 101 (H) 08/24/2018   BUN 17 08/24/2018   CREATININE 1.06 (H) 08/24/2018   BILITOT 0.4 07/12/2018   ALKPHOS 62 02/07/2018   AST 22 07/12/2018   ALT 19 07/12/2018   PROT 6.1 07/12/2018   ALBUMIN 3.0 (L) 02/07/2018   CALCIUM 8.4 (L) 08/24/2018   GFRAA 52 (L) 08/24/2018    Speciality Comments: No specialty comments available.  Procedures:  No procedures performed Allergies: Patient has no known allergies.   Assessment / Plan:     Visit Diagnoses: No diagnosis found.   Orders: No orders of the defined types were placed in this encounter.  No orders of the defined types were placed in this encounter.   Face-to-face time spent with patient was *** minutes. Greater than 50% of time was spent in counseling and coordination of care.  Follow-Up Instructions: No follow-ups on file.   Earnestine Mealing, CMA  Note - This record has been created using Editor, commissioning.  Chart creation errors have been sought, but may not always  have been located. Such creation errors do not reflect on  the standard of medical care.

## 2018-12-15 NOTE — Telephone Encounter (Signed)
:  New Message         Pt c/o BP issue: STAT if pt c/o blurred vision, one-sided weakness or slurred speech  1. What are your last 5 BP readings?       BP @ 8:00 111/89 Pulse 81      BP @ 9:00 128/82 After excersie Pulse 98        2. Are you having any other symptoms (ex. Dizziness, headache, blurred vision, passed out)? NO  3. What is your BP issue? BP is good most of the time, she is concerning about her pulse.

## 2018-12-15 NOTE — Telephone Encounter (Signed)
Follow up   Patient states that your b/p on today is 128/82 98 pulse 114/72 bp. Patient would like for you to know this information and would like a call back.

## 2018-12-15 NOTE — Telephone Encounter (Signed)
Spoke with pt. She states her husband wanted to let us know that her pulse becomes elevated when she is moving around and exercising. She states it comes back down at rest. I reassured her this was normal and her heart is doing exactly what it is supposed to do when she is active. I advised her if her pulse remained elevated > 90min after she rests, then she could call the office for recommendation. She verbalized understanding and thanked me for the call.

## 2018-12-15 NOTE — Telephone Encounter (Signed)
Pt advised her readings were normal. She was educated on normal readings do not need to be related to [place]. She understands to report SBP's > 130 < 100 and HR < 100 > 50.  Pt verbalized understanding and thanked me for the call.

## 2018-12-18 LAB — CUP PACEART REMOTE DEVICE CHECK
Battery Voltage: 2.9 V
Date Time Interrogation Session: 20191219155608
Implantable Lead Implant Date: 20110929
Implantable Lead Location: 753860
Implantable Pulse Generator Implant Date: 20110929
Lead Channel Impedance Value: 704 Ohm
Lead Channel Sensing Intrinsic Amplitude: 10.349 mV
Lead Channel Setting Pacing Amplitude: 2.5 V
Lead Channel Setting Pacing Pulse Width: 0.4 ms
MDC IDC LEAD IMPLANT DT: 20110929
MDC IDC LEAD LOCATION: 753859
MDC IDC MSMT LEADCHNL RA IMPEDANCE VALUE: 368 Ohm
MDC IDC MSMT LEADCHNL RA SENSING INTR AMPL: 0.513 mV
MDC IDC SET LEADCHNL RA PACING AMPLITUDE: 2 V
MDC IDC SET LEADCHNL RV SENSING SENSITIVITY: 0.9 mV
MDC IDC STAT BRADY AP VP PERCENT: 0.59 %
MDC IDC STAT BRADY AP VS PERCENT: 95.06 %
MDC IDC STAT BRADY AS VP PERCENT: 0.82 %
MDC IDC STAT BRADY AS VS PERCENT: 3.53 %
MDC IDC STAT BRADY RA PERCENT PACED: 95.1 %
MDC IDC STAT BRADY RV PERCENT PACED: 1.32 %

## 2018-12-20 ENCOUNTER — Telehealth: Payer: Self-pay | Admitting: Cardiology

## 2018-12-20 NOTE — Telephone Encounter (Signed)
LMOVM requesting that pt send a manual transmission w/ her home monitor. MD wants to look at her afib.

## 2018-12-22 NOTE — Telephone Encounter (Signed)
-----   Message from Deboraha Sprang, MD sent at 12/19/2018  8:30 AM EST ----- Remote reviewed. This remote is abnormal for persisting atrial fib   B  Can we get a new transimission to look at her afbi

## 2018-12-22 NOTE — Telephone Encounter (Signed)
Spoke w/ pt and requested that she send a manual transmission w/ her home monitor today. Pt and husband verbalized understanding and said they would do this today.

## 2018-12-22 NOTE — Telephone Encounter (Signed)
Manual transmission received and reviewed. Presenting rhythm as of 12/22/18 at 1547 shows AF/Vs 82-115bpm. 97.9% AT/AF burden since last transmission on 11/17/18.

## 2018-12-23 NOTE — Telephone Encounter (Signed)
Spoke with patient. She has noted some BLE edema that develops over the course of the day and improves overnight, but denies increased ShOB, chest discomfort, palpitations, weight gain, or other cardiac symptoms. She reports she was completely unaware that she has been in A-fib. She reports compliance with medications.   Advised patient that I will route this message to Dr. Caryl Comes for review and recommendations. She is agreeable to this plan and denies questions or concerns at this time.

## 2018-12-26 ENCOUNTER — Telehealth: Payer: Self-pay | Admitting: Internal Medicine

## 2018-12-26 NOTE — Telephone Encounter (Signed)
New Message   Patient returning Maxine Glenn phone call!

## 2018-12-26 NOTE — Telephone Encounter (Signed)
E & M  As she is still in afib and with some edema, think best thing is to DCCV  Please arrange OV w me or probably more expedtiously with Afib clinic  Thx

## 2018-12-26 NOTE — Telephone Encounter (Signed)
Pt scheduled for AF clinic on 1/28 with Roderic Palau.

## 2018-12-27 ENCOUNTER — Telehealth: Payer: Self-pay | Admitting: Internal Medicine

## 2018-12-27 ENCOUNTER — Ambulatory Visit (HOSPITAL_COMMUNITY)
Admission: RE | Admit: 2018-12-27 | Discharge: 2018-12-27 | Disposition: A | Discharge status: Home / Self Care | Payer: Medicare Other | Source: Ambulatory Visit | Attending: Nurse Practitioner | Admitting: Nurse Practitioner

## 2018-12-27 ENCOUNTER — Ambulatory Visit: Payer: Medicare Other | Admitting: Rheumatology

## 2018-12-27 VITALS — BP 122/68 | HR 96 | Ht 65.0 in | Wt 155.0 lb

## 2018-12-27 DIAGNOSIS — I10 Essential (primary) hypertension: Secondary | ICD-10-CM | POA: Diagnosis not present

## 2018-12-27 DIAGNOSIS — Z79899 Other long term (current) drug therapy: Secondary | ICD-10-CM | POA: Diagnosis not present

## 2018-12-27 DIAGNOSIS — Z950 Presence of cardiac pacemaker: Secondary | ICD-10-CM | POA: Diagnosis not present

## 2018-12-27 DIAGNOSIS — I495 Sick sinus syndrome: Secondary | ICD-10-CM | POA: Insufficient documentation

## 2018-12-27 DIAGNOSIS — Z8249 Family history of ischemic heart disease and other diseases of the circulatory system: Secondary | ICD-10-CM | POA: Diagnosis not present

## 2018-12-27 DIAGNOSIS — I4819 Other persistent atrial fibrillation: Secondary | ICD-10-CM | POA: Insufficient documentation

## 2018-12-27 DIAGNOSIS — E785 Hyperlipidemia, unspecified: Secondary | ICD-10-CM | POA: Diagnosis not present

## 2018-12-27 DIAGNOSIS — Z7901 Long term (current) use of anticoagulants: Secondary | ICD-10-CM | POA: Insufficient documentation

## 2018-12-27 MED ORDER — AMIODARONE HCL 200 MG PO TABS: 200.0000 mg | ORAL_TABLET | Freq: Two times a day (BID) | ORAL | 0 refills | Status: AC

## 2018-12-27 MED ORDER — AMIODARONE HCL 200 MG PO TABS: 200.0000 mg | ORAL_TABLET | Freq: Two times a day (BID) | ORAL | 2 refills | Status: DC

## 2018-12-27 NOTE — Telephone Encounter (Signed)
AF clinic called and gave directions to pt for her OV today.

## 2018-12-27 NOTE — Telephone Encounter (Signed)
Called pt and let her know her orders from the A fib clinic were appropriate. Dr Caryl Comes and Roderic Palau work together closely and based off her OV notes, pt has decided to try medication treatment vs DCCV at this time. Pt has verbalized understanding and had no additional questions.

## 2018-12-27 NOTE — Patient Instructions (Signed)
Increase amiodarone to 200mg twice a day 

## 2018-12-27 NOTE — Telephone Encounter (Signed)
  Pt c/o medication issue:  1. Name of Medication:amiodarone (PACERONE) 200 MG tablet  2. How are you currently taking this medication (dosage and times per day)? Was taking 3 x a week  3. Are you having a reaction (difficulty breathing--STAT)? No  4. What is your medication issue?   Husband is calling because Ms Doig went to the Afib clinic today and they told her to take her amiodarone (PACERONE) 200 MG tablet  2 x day 7 days a week. Husband states that Dr Caryl Comes had her only taking it 3 times a week. Husband would like to know that Dr Caryl Comes agrees with the new dosage before he gives it to his wife. This was her first visit to the AFIB clinic and he wants to make sure that Dr Caryl Comes is aware and agrees with any changes that are made.

## 2018-12-27 NOTE — Progress Notes (Addendum)
Patient ID: Anne Macias, female   DOB: [birth year], 85 y.o.   MRN: [id number]     Primary Care Physician: Lavone Orn, MD Referring Physician: Dr. Manon Hilding is a 85 y.o. female with a h/o persistent  afib that was started on amiodarone 07/31/16 and has been maintaining SR.She is feeling well, no bleeding issues on warfarin. Has not noticed any irregular heart beat.  F/u Afib clinic 12/27/18. Patient reports that overall she has been doing well. Last week, she was noted to have persistent atrial fibrillation on her device check with HR 80s-110s. She was unaware she was in atrial fibrillation with no palpitations, heart racing, or SOB. However, she has had some increased lower extremity edema in the last two weeks. This has improved in the last couple days. She is in afib today.  Today, she denies symptoms of palpitations, chest pain, shortness of breath, orthopnea, PND, dizziness, presyncope, syncope, or neurologic sequela. The patient is tolerating medications without difficulties and is otherwise without complaint today.   Past Medical History:  Diagnosis Date  . Chronic anticoagulation   . Edema of lower extremity   . Hyperlipidemia   . Hypertension   . PAF (paroxysmal atrial fibrillation) ([place]) 1998  . S/P cardiac pacemaker procedure Sept 9242   complicated by pocket hematoma  . Tachy-brady syndrome ([place])   . Tremor 05/08/2014  . Visual disturbance    Past Surgical History:  Procedure Laterality Date  . CARDIOVERSION N/A 02/26/2014   Procedure: CARDIOVERSION;  Surgeon: Deboraha Sprang, MD;  Location: [facility] ENDOSCOPY;  Service: Cardiovascular;  Laterality: N/A;  . CARDIOVERSION N/A 03/19/2014   Procedure: CARDIOVERSION;  Surgeon: Emilynn Spark, MD;  Location: [facility] ENDOSCOPY;  Service: Cardiovascular;  Laterality: N/A;  . CARDIOVERSION N/A 06/17/2015   Procedure: CARDIOVERSION;  Surgeon: Thresa Spark, MD;  Location: [facility];  Service: Cardiovascular;   Laterality: N/A;  . CATARACT EXTRACTION    . CHOLECYSTECTOMY  2005  . INSERT / REPLACE / REMOVE PACEMAKER  08/28/2010   implantaton  . US ECHOCARDIOGRAPHY  04/10/2010   EF 55-60%    Current Outpatient Medications  Medication Sig Dispense Refill  . alendronate (FOSAMAX) 70 MG tablet TAKE 1 TABLET BY MOUTH ONCE A WEEK WITH A FULL GLASS OF WATER ON AN EMPTY STOMACH 12 tablet 0  . amiodarone (PACERONE) 200 MG tablet Take 1 tablet (200 mg total) by mouth 2 (two) times daily. 90 tablet 2  . Calcium Carbonate-Vitamin D (CALTRATE 600+D PO) Take 2 tablets by mouth daily.      . furosemide (LASIX) 80 MG tablet Take 80 mg by mouth daily. Some days take one half tablet if swelling is down    . KLOR-CON M20 20 MEQ tablet Take 20 mEq by mouth daily. with food  11  . metoprolol succinate (TOPROL-XL) 100 MG 24 hr tablet Take 1 tablet (100 mg total) by mouth 2 (two) times daily. 180 tablet 3  . Polyethyl Glycol-Propyl Glycol (SYSTANE OP) Place 1 drop into both eyes 2 (two) times daily.    Marland Kitchen warfarin (COUMADIN) 2 MG tablet Take 1-2 mg by mouth daily. Take 1mg  by mouth on Wednesday and Saturday. On all other days take 2 mg once daily.  4   No current facility-administered medications for this encounter.     No Known Allergies  Social History   Socioeconomic History  . Marital status: Married    Spouse name: Not on file  . Number of children:  Not on file  . Years of education: Not on file  . Highest education level: Not on file  Occupational History  . Not on file  Social Needs  . Financial resource strain: Not on file  . Food insecurity:    Worry: Not on file    Inability: Not on file  . Transportation needs:    Medical: Not on file    Non-medical: Not on file  Tobacco Use  . Smoking status: Never Smoker  . Smokeless tobacco: Never Used  Substance and Sexual Activity  . Alcohol use: Yes    Alcohol/week: 7.0 standard drinks    Types: 7 Glasses of wine per week    Comment: 5 oz red wine  daily at dinner  . Drug use: Never  . Sexual activity: Not on file  Lifestyle  . Physical activity:    Days per week: Not on file    Minutes per session: Not on file  . Stress: Not on file  Relationships  . Social connections:    Talks on phone: Not on file    Gets together: Not on file    Attends religious service: Not on file    Active member of club or organization: Not on file    Attends meetings of clubs or organizations: Not on file    Relationship status: Not on file  . Intimate partner violence:    Fear of current or ex partner: Not on file    Emotionally abused: Not on file    Physically abused: Not on file    Forced sexual activity: Not on file  Other Topics Concern  . Not on file  Social History Narrative  . Not on file    Family History  Problem Relation Age of Onset  . Heart attack Mother   . Pneumonia Father   . Heart attack Brother   . Alcohol abuse Brother     ROS- All systems are reviewed and negative except as per the HPI above  Physical Exam: Vitals:   12/27/18 0935  BP: 122/68  Pulse: 96  Weight: 70.3 kg  Height: 5\' 5"  (1.651 m)    GEN- The patient is well appearing elderly female, alert and oriented x 3 today.   HEENT-head normocephalic, atraumatic, sclera clear, conjunctiva pink, hearing intact, trachea midline. Lungs- Clear to ausculation bilaterally, normal work of breathing Heart- irregular rate and rhythm, no murmurs, rubs or gallops  GI- soft, NT, ND, + BS Extremities- no clubbing, cyanosis, or edema MS- no significant deformity or atrophy Skin- no rash or lesion Psych- euthymic mood, full affect Neuro- strength and sensation are intact   EKG- atrial fibrillation HR 96, RBBB, PVC, QRS 124, QTc 482.   Assessment and Plan: 1. Persistent afib Has been maintained on amiodarone. TSH and LFT up to date. Persistent afib noted on device interrogation with HR 80s-110s associated with some increased bilateral lower extremity edema. No  other symptoms. Dicussed therapeutic options including increasing her amiodarone vs DCCV. Per patient, she would like to try increasing her medication first. If she is still in afib, then she would like to proceed with DCCV. Increase amiodarone 200 mg BID Continue warfarin per Dr Laurann Montana office Weekly INR in anticipation of DCCV.  This patients CHA2DS2-VASc Score and unadjusted Ischemic Stroke Rate (% per year) is equal to 4.8 % stroke rate/year from a score of 4  Above score calculated as 1 point each if present [CHF, HTN, DM, Vascular=MI/PAD/Aortic Plaque, Age if 47-74, or  Female] Above score calculated as 2 points each if present [Age > 75, or Stroke/TIA/TE]   2. PPM Implanted for tachy-brady syndrome. Pacer checks per device clinic.  3. HTN Stable, no changes today   F/u in Afib clinic in 2 weeks for ECG. If still in afib, will arrange for DCCV.  Addendum: INR records from PCP office from last three months.  10/11/18 - 1.9 11/08/18 - 2.6 12/06/18 - 3.1  Adline Peals PA-C Afib [facility] [address] [place], [place] [address] [phone]

## 2018-12-28 NOTE — Telephone Encounter (Signed)
Spoke with Dr. Harlow Mares, he stated his parents did not know exactly what happened yesterday at his afib clinic appointment. Advised that they increased amiodarone to 200 mg, twice a day for 2 weeks and if the medication does not work then they will consider DCCV.

## 2018-12-28 NOTE — Telephone Encounter (Signed)
° °  Patient's son calling with questions regarding referral to afib clinic.  Please call Dr Harlow Mares at [phone]

## 2019-01-03 ENCOUNTER — Other Ambulatory Visit: Payer: Self-pay | Admitting: *Deleted

## 2019-01-03 MED ORDER — METOPROLOL SUCCINATE ER 100 MG PO TB24: 100.0000 mg | ORAL_TABLET | Freq: Two times a day (BID) | ORAL | 1 refills | Status: AC

## 2019-01-09 ENCOUNTER — Other Ambulatory Visit: Payer: Self-pay

## 2019-01-09 ENCOUNTER — Encounter (HOSPITAL_COMMUNITY): Payer: Self-pay | Admitting: Emergency Medicine

## 2019-01-09 ENCOUNTER — Inpatient Hospital Stay (HOSPITAL_COMMUNITY)
Admission: EM | Admit: 2019-01-09 | Discharge: 2019-01-29 | DRG: 208 | Disposition: E | Payer: Medicare Other | Attending: Pulmonary Disease | Admitting: Pulmonary Disease

## 2019-01-09 ENCOUNTER — Telehealth: Payer: Self-pay | Admitting: Cardiology

## 2019-01-09 ENCOUNTER — Emergency Department (HOSPITAL_COMMUNITY): Payer: Medicare Other

## 2019-01-09 DIAGNOSIS — R0600 Dyspnea, unspecified: Secondary | ICD-10-CM

## 2019-01-09 DIAGNOSIS — N179 Acute kidney failure, unspecified: Secondary | ICD-10-CM | POA: Diagnosis present

## 2019-01-09 DIAGNOSIS — I130 Hypertensive heart and chronic kidney disease with heart failure and stage 1 through stage 4 chronic kidney disease, or unspecified chronic kidney disease: Secondary | ICD-10-CM | POA: Diagnosis present

## 2019-01-09 DIAGNOSIS — A419 Sepsis, unspecified organism: Secondary | ICD-10-CM

## 2019-01-09 DIAGNOSIS — Z9289 Personal history of other medical treatment: Secondary | ICD-10-CM

## 2019-01-09 DIAGNOSIS — E861 Hypovolemia: Secondary | ICD-10-CM | POA: Diagnosis not present

## 2019-01-09 DIAGNOSIS — J9690 Respiratory failure, unspecified, unspecified whether with hypoxia or hypercapnia: Secondary | ICD-10-CM

## 2019-01-09 DIAGNOSIS — J9601 Acute respiratory failure with hypoxia: Secondary | ICD-10-CM

## 2019-01-09 DIAGNOSIS — N390 Urinary tract infection, site not specified: Secondary | ICD-10-CM | POA: Diagnosis present

## 2019-01-09 DIAGNOSIS — R739 Hyperglycemia, unspecified: Secondary | ICD-10-CM | POA: Diagnosis not present

## 2019-01-09 DIAGNOSIS — J90 Pleural effusion, not elsewhere classified: Secondary | ICD-10-CM

## 2019-01-09 DIAGNOSIS — N183 Chronic kidney disease, stage 3 unspecified: Secondary | ICD-10-CM | POA: Diagnosis present

## 2019-01-09 DIAGNOSIS — E039 Hypothyroidism, unspecified: Secondary | ICD-10-CM | POA: Diagnosis present

## 2019-01-09 DIAGNOSIS — R748 Abnormal levels of other serum enzymes: Secondary | ICD-10-CM | POA: Diagnosis present

## 2019-01-09 DIAGNOSIS — Z950 Presence of cardiac pacemaker: Secondary | ICD-10-CM

## 2019-01-09 DIAGNOSIS — I351 Nonrheumatic aortic (valve) insufficiency: Secondary | ICD-10-CM | POA: Diagnosis present

## 2019-01-09 DIAGNOSIS — J189 Pneumonia, unspecified organism: Principal | ICD-10-CM | POA: Diagnosis present

## 2019-01-09 DIAGNOSIS — R6521 Severe sepsis with septic shock: Secondary | ICD-10-CM | POA: Diagnosis not present

## 2019-01-09 DIAGNOSIS — N189 Chronic kidney disease, unspecified: Secondary | ICD-10-CM

## 2019-01-09 DIAGNOSIS — E785 Hyperlipidemia, unspecified: Secondary | ICD-10-CM | POA: Diagnosis present

## 2019-01-09 DIAGNOSIS — E860 Dehydration: Secondary | ICD-10-CM | POA: Diagnosis not present

## 2019-01-09 DIAGNOSIS — I2510 Atherosclerotic heart disease of native coronary artery without angina pectoris: Secondary | ICD-10-CM | POA: Diagnosis present

## 2019-01-09 DIAGNOSIS — W1843XA Slipping, tripping and stumbling without falling due to stepping from one level to another, initial encounter: Secondary | ICD-10-CM | POA: Diagnosis present

## 2019-01-09 DIAGNOSIS — B9620 Unspecified Escherichia coli [E. coli] as the cause of diseases classified elsewhere: Secondary | ICD-10-CM | POA: Diagnosis present

## 2019-01-09 DIAGNOSIS — R791 Abnormal coagulation profile: Secondary | ICD-10-CM

## 2019-01-09 DIAGNOSIS — I2729 Other secondary pulmonary hypertension: Secondary | ICD-10-CM | POA: Diagnosis present

## 2019-01-09 DIAGNOSIS — D649 Anemia, unspecified: Secondary | ICD-10-CM | POA: Diagnosis present

## 2019-01-09 DIAGNOSIS — Z9049 Acquired absence of other specified parts of digestive tract: Secondary | ICD-10-CM

## 2019-01-09 DIAGNOSIS — I700 Atherosclerosis of aorta: Secondary | ICD-10-CM | POA: Diagnosis present

## 2019-01-09 DIAGNOSIS — Z452 Encounter for adjustment and management of vascular access device: Secondary | ICD-10-CM

## 2019-01-09 DIAGNOSIS — G9349 Other encephalopathy: Secondary | ICD-10-CM | POA: Diagnosis not present

## 2019-01-09 DIAGNOSIS — J918 Pleural effusion in other conditions classified elsewhere: Secondary | ICD-10-CM | POA: Diagnosis present

## 2019-01-09 DIAGNOSIS — J811 Chronic pulmonary edema: Secondary | ICD-10-CM

## 2019-01-09 DIAGNOSIS — Z79899 Other long term (current) drug therapy: Secondary | ICD-10-CM

## 2019-01-09 DIAGNOSIS — I5082 Biventricular heart failure: Secondary | ICD-10-CM | POA: Diagnosis present

## 2019-01-09 DIAGNOSIS — I5033 Acute on chronic diastolic (congestive) heart failure: Secondary | ICD-10-CM

## 2019-01-09 DIAGNOSIS — N3000 Acute cystitis without hematuria: Secondary | ICD-10-CM

## 2019-01-09 DIAGNOSIS — Z515 Encounter for palliative care: Secondary | ICD-10-CM

## 2019-01-09 DIAGNOSIS — Z8249 Family history of ischemic heart disease and other diseases of the circulatory system: Secondary | ICD-10-CM

## 2019-01-09 DIAGNOSIS — W19XXXA Unspecified fall, initial encounter: Secondary | ICD-10-CM | POA: Diagnosis present

## 2019-01-09 DIAGNOSIS — Z7901 Long term (current) use of anticoagulants: Secondary | ICD-10-CM

## 2019-01-09 DIAGNOSIS — Z66 Do not resuscitate: Secondary | ICD-10-CM | POA: Diagnosis present

## 2019-01-09 DIAGNOSIS — R0902 Hypoxemia: Secondary | ICD-10-CM

## 2019-01-09 DIAGNOSIS — R0602 Shortness of breath: Secondary | ICD-10-CM | POA: Diagnosis not present

## 2019-01-09 DIAGNOSIS — R531 Weakness: Secondary | ICD-10-CM

## 2019-01-09 DIAGNOSIS — Z789 Other specified health status: Secondary | ICD-10-CM

## 2019-01-09 DIAGNOSIS — I4819 Other persistent atrial fibrillation: Secondary | ICD-10-CM | POA: Diagnosis present

## 2019-01-09 HISTORY — DX: Hypothyroidism, unspecified: E03.9

## 2019-01-09 MED ORDER — SODIUM CHLORIDE 0.9 % IV BOLUS
500.0000 mL | Freq: Once | INTRAVENOUS | Status: AC
  Administered 2019-01-10: 500 mL via INTRAVENOUS

## 2019-01-09 NOTE — ED Triage Notes (Signed)
Coming from home after fall this evening. Increased weakness over the past week. Pt's BP meds were recently doubled over the past week as well. Hx of a-fib. No known injuries from fall today. Pt is on Warfarin. No LOC noted from fall

## 2019-01-09 NOTE — ED Provider Notes (Signed)
Emergency Department Provider Note   I have reviewed the triage vital signs and the nursing notes.   HISTORY  Chief Complaint Fall   HPI Anne Macias is a 85 y.o. female with PMH of HTN, HLD, PAF on Amiodarone and Warfarin, and pacemaker presents to the emergency department evaluation of generalized weakness with fall at home.  Patient lives at home with her elderly husband.  She was walking with family into the house when she was unable to make it up the 3 steps from her arise into the house.  She was no longer able to stand and lowered gently to her knees by family who are assisting her.  Neighbors were called to help move the patient into a chair.  She was very weak, diffusely, which is new.  The daughter states that she typically needs assistance walking up those 3 steps but is normally able to walk on her own in the house.  Patient denies any unilateral weakness or numbness.  She is not experiencing pain of any kind including chest pain or abdominal discomfort.  No back pain.  No headaches.  She did not fall on her outstretched hands.  She is not experiencing pain in her pelvis or hips.  The patient is managed as an outpatient for her A. fib and recently had the dose of her amiodarone increased which is the only medication change.  Patient did not sustain any head or neck trauma during the lowering to the ground.    Past Medical History:  Diagnosis Date  . Chronic anticoagulation   . Edema of lower extremity   . Hyperlipidemia   . Hypertension   . Hypothyroidism   . PAF (paroxysmal atrial fibrillation) ([place]) 1998  . S/P cardiac pacemaker procedure Sept 0355   complicated by pocket hematoma  . Tachy-brady syndrome ([place])   . Tremor 05/08/2014  . Visual disturbance     Patient Active Problem List   Diagnosis Date Noted  . Generalized weakness 01/10/2019  . CAP (community acquired pneumonia) 01/10/2019  . Pleural effusion 01/10/2019  . AKI (acute kidney injury) ([place])  01/10/2019  . CKD (chronic kidney disease) stage 3, GFR 30-59 ml/min (HCC) 01/10/2019  . Acute kidney injury superimposed on CKD ([place]) 01/10/2019  . Hypothyroidism   . Chronic midline low back pain without sciatica 11/05/2016  . Spinal stenosis of lumbar region with neurogenic claudication 11/05/2016  . Spondylolisthesis, lumbar region 11/05/2016  . Pyelonephritis 07/14/2016  . Closed compression fracture of L2 lumbar vertebra, with delayed healing, subsequent encounter 07/14/2016  . UTI (urinary tract infection) 07/14/2016  . Fall 06/20/2014  . Anticoagulated on Coumadin 06/20/2014  . Hyperlipidemia 06/20/2014  . Acute blood loss anemia 06/20/2014  . Pelvic fracture ([place]) 06/19/2014  . Tremor 05/08/2014  . Renal insufficiency 03/12/2014  . Anorexia 03/12/2014  . Sinus node dysfunction ([place]) 02/17/2012  . Essential hypertension, benign 12/09/2010  . Atrial fibrillation, persistent 12/09/2010  . PACEMAKER, PERMANENT-[employer] REVO 12/08/2010    Past Surgical History:  Procedure Laterality Date  . CARDIOVERSION N/A 02/26/2014   Procedure: CARDIOVERSION;  Surgeon: Deboraha Sprang, MD;  [facility] ENDOSCOPY;  Service: Cardiovascular;  Laterality: N/A;  . CARDIOVERSION N/A 03/19/2014   Procedure: CARDIOVERSION;  Surgeon: Elizabethann Spark, MD;  Location: [facility] ENDOSCOPY;  Service: Cardiovascular;  Laterality: N/A;  . CARDIOVERSION N/A 06/17/2015   Procedure: CARDIOVERSION;  Surgeon: Illyanna Spark, MD;  Location: [place];  Service: Cardiovascular;  Laterality: N/A;  . CATARACT EXTRACTION    .  CHOLECYSTECTOMY  2005  . INSERT / REPLACE / REMOVE PACEMAKER  08/28/2010   implantaton  . US ECHOCARDIOGRAPHY  04/10/2010   EF 55-60%    Allergies Patient has no known allergies.  Family History  Problem Relation Age of Onset  . Heart attack Mother   . Pneumonia Father   . Heart attack Brother   . Alcohol abuse Brother     Social History Social History   Tobacco Use  . Smoking  status: Never Smoker  . Smokeless tobacco: Never Used  Substance Use Topics  . Alcohol use: Yes    Alcohol/week: 7.0 standard drinks    Types: 7 Glasses of wine per week    Comment: 5 oz red wine daily at dinner  . Drug use: Never    Review of Systems  Constitutional: No fever/chills. Positive generalized weakness.  Eyes: No visual changes. ENT: No sore throat. Cardiovascular: Denies chest pain. Respiratory: Denies shortness of breath. Gastrointestinal: No abdominal pain.  No nausea, no vomiting.  No diarrhea.  No constipation. Genitourinary: Negative for dysuria. Musculoskeletal: Negative for back pain.  Skin: Negative for rash. Neurological: Negative for headaches, focal weakness or numbness.  10-point ROS otherwise negative.  ____________________________________________   PHYSICAL EXAM:  VITAL SIGNS: ED Triage Vitals  Enc Vitals Group     BP 12/31/2018 2119 112/74     Pulse Rate 01/14/2019 2119 98     Resp 01/16/2019 2119 16     Temp 01/18/2019 2119 97.7 F (36.5 C)     Temp Source 01/15/2019 2119 Oral     SpO2 01/19/2019 2119 95 %     Weight 01/03/2019 2115 135 lb (61.2 kg)     Height 01/12/2019 2115 5\' 7"  (1.702 m)     Pain Score 01/11/2019 2114 0   Constitutional: Alert and oriented. Well appearing and in no acute distress. Eyes: Conjunctivae are normal. EOMI. Dilated pupils bilaterally.  Head: Atraumatic. Nose: No congestion/rhinnorhea. Mouth/Throat: Mucous membranes are moist.  Neck: No stridor. No cervical spine tenderness to palpation. Cardiovascular: Normal rate, regular rhythm. Good peripheral circulation. Grossly normal heart sounds.   Respiratory: Normal respiratory effort.  No retractions. Lungs CTAB. Gastrointestinal: Soft and nontender. No distention.  Musculoskeletal: No lower extremity tenderness nor edema. No gross deformities of extremities. Normal passive ROM of the bilateral upper and lower extremities.  Neurologic:  Normal speech and language. No gross  focal neurologic deficits are appreciated.  Skin:  Skin is warm, dry and intact. No rash noted.  ____________________________________________   LABS (all labs ordered are listed, but only abnormal results are displayed)  Labs Reviewed  COMPREHENSIVE METABOLIC PANEL - Abnormal; Notable for the following components:      Result Value   Glucose, Bld 117 (*)    BUN 46 (*)    Creatinine, Ser 1.96 (*)    Calcium 8.4 (*)    Total Protein 6.3 (*)    Albumin 2.3 (*)    Alkaline Phosphatase 175 (*)    GFR calc non Af Amer 22 (*)    GFR calc Af Amer 25 (*)    All other components within normal limits  BRAIN NATRIURETIC PEPTIDE - Abnormal; Notable for the following components:   B Natriuretic Peptide 982.2 (*)    All other components within normal limits  CBC WITH DIFFERENTIAL/PLATELET - Abnormal; Notable for the following components:   Hemoglobin 11.7 (*)    MCH 23.3 (*)    MCHC 28.6 (*)    RDW 16.4 (*)  nRBC 0.4 (*)    All other components within normal limits  URINALYSIS, ROUTINE W REFLEX MICROSCOPIC - Abnormal; Notable for the following components:   APPearance HAZY (*)    Leukocytes, UA TRACE (*)    Bacteria, UA MANY (*)    All other components within normal limits  PROTIME-INR - Abnormal; Notable for the following components:   Prothrombin Time 28.0 (*)    All other components within normal limits  TSH - Abnormal; Notable for the following components:   TSH 6.975 (*)    All other components within normal limits  BASIC METABOLIC PANEL - Abnormal; Notable for the following components:   CO2 21 (*)    Glucose, Bld 101 (*)    BUN 45 (*)    Creatinine, Ser 1.81 (*)    Calcium 8.4 (*)    GFR calc non Af Amer 24 (*)    GFR calc Af Amer 27 (*)    All other components within normal limits  PROTIME-INR - Abnormal; Notable for the following components:   Prothrombin Time 27.4 (*)    All other components within normal limits  CULTURE, BLOOD (ROUTINE X 2)  CULTURE, BLOOD  (ROUTINE X 2)  URINE CULTURE  TROPONIN I  MAGNESIUM  T4, FREE  SODIUM, URINE, RANDOM  OSMOLALITY, URINE   ____________________________________________  EKG   EKG Interpretation  Date/Time:  Monday January 09 2019 21:28:41 EST Ventricular Rate:  101 PR Interval:    QRS Duration: 139 QT Interval:  387 QTC Calculation: 502 R Axis:   6 Text Interpretation:  Ventricular-paced complexes Right bundle branch block Nonspecific T abnormalities, lateral leads No STEMI.  Confirmed by Nanda Quinton [id number]) on 01/14/2019 9:42:27 PM       ____________________________________________  RADIOLOGY  Dg Chest 2 View  Result Date: 01/04/2019 CLINICAL DATA:  Fall EXAM: CHEST - 2 VIEW COMPARISON:  06/19/2014 FINDINGS: Right-sided dual lead pacing device as before. Small left-sided pleural effusion. Moderate right pleural effusion. Mild cardiomegaly with aortic atherosclerosis. Ill-defined opacity in the left perihilar region. Consolidation at both bases. No pneumothorax. IMPRESSION: 1. Bilateral pleural effusions, moderate on the right and small on the left. Bibasilar consolidations which may reflect atelectasis or pneumonia. 2. Ill-defined left perihilar region opacity, may reflect infiltrate although mass not excluded. Short interval radiographic follow-up and/or CT may be considered for further evaluation. Electronically Signed   By: Donavan Foil M.D.   On: 01/25/2019 22:34   Ct Chest Wo Contrast  Result Date: 01/10/2019 CLINICAL DATA:  Pleural effusions EXAM: CT CHEST WITHOUT CONTRAST TECHNIQUE: Multidetector CT imaging of the chest was performed following the standard protocol without IV contrast. COMPARISON:  01/26/2019 FINDINGS: Cardiovascular: Aortic and coronary artery calcifications. Tortuosity of the thoracic aorta. No aortic aneurysm. Cardiomegaly. Pacer in place with leads in the right atrium and right ventricle. Mediastinum/Nodes: No mediastinal, hilar, or axillary adenopathy. Lungs/Pleura:  Moderate to large right effusion with small to moderate left effusion. Compressive atelectasis in the lower lobes. Ground-glass airspace opacities in both upper lobes, left greater than right. Upper Abdomen: Imaging into the upper abdomen shows no acute findings. Musculoskeletal: Chest wall soft tissues are unremarkable. No acute bony abnormality. IMPRESSION: Bilateral pleural effusions, right larger than left. Compressive atelectasis in the lower lobes. Ground-glass opacities in both upper lobes, left greater than right could reflect pneumonia. Cardiomegaly, coronary artery disease. Aortic Atherosclerosis (ICD10-I70.0). Electronically Signed   By: Rolm Baptise M.D.   On: 01/10/2019 03:21   US Renal  Result Date: 01/10/2019  CLINICAL DATA:  85 year old female with history of acute kidney disease. EXAM: RENAL / URINARY TRACT ULTRASOUND COMPLETE COMPARISON:  None. FINDINGS: Right Kidney: Renal measurements: 8.9 x 4.6 x 4.4 cm = volume: 93 mL . Echogenicity within normal limits. Diffuse cortical thinning with expansion of the central sinus echo complex. No mass or hydronephrosis visualized. Left Kidney: Renal measurements: 9.8 x 5.5 x 5.5 cm = volume: 154 mL. Echogenicity within normal limits. Diffuse cortical thinning with expansion of the central sinus echo complex. No mass or hydronephrosis visualized. Bladder: Appears normal for degree of bladder distention. IMPRESSION: 1. No hydronephrosis or other acute findings. 2. Mild bilateral renal atrophy. Electronically Signed   By: Vinnie Langton M.D.   On: 01/10/2019 05:03    ____________________________________________   PROCEDURES  Procedure(s) performed:   Procedures  None ____________________________________________   INITIAL IMPRESSION / ASSESSMENT AND PLAN / ED COURSE  Pertinent labs & imaging results that were available during my care of the patient were reviewed by me and considered in my medical decision making (see chart for  details).  Patient presents to the emergency department with generalized weakness and inability to walk at her baseline.  Mental status is normal for her.  She has normal passive range of motion of all extremities.  No C-spine tenderness.  No evidence of head trauma.  Patient was lowered slowly to the ground by family when she was no longer able to stand.  Generalized weakness has been gradually worsening over the past 7 days after increasing her amiodarone, according to family.  No concern for stroke.  No concern for sepsis clinically or based on vital signs.   CXR reviewed. Doubt PNA. Hold on abx until labs resulted. Favor fluid and possible a-fib complication vs Amiodarone side effect.   UA and labs pending. Care transferred to Dr. Leonides Schanz. Anticipate admission.  ____________________________________________  FINAL CLINICAL IMPRESSION(S) / ED DIAGNOSES  Final diagnoses:  Generalized weakness  Community acquired pneumonia, unspecified laterality  Acute cystitis without hematuria  AKI (acute kidney injury) ([place])     MEDICATIONS GIVEN DURING THIS VISIT:  Medications  azithromycin (ZITHROMAX) 500 mg in sodium chloride 0.9 % 250 mL IVPB (has no administration in time range)  cefTRIAXone (ROCEPHIN) 1 g in sodium chloride 0.9 % 100 mL IVPB (has no administration in time range)  acetaminophen (TYLENOL) tablet 650 mg (has no administration in time range)    Or  acetaminophen (TYLENOL) suppository 650 mg (has no administration in time range)  Warfarin - Pharmacist Dosing Inpatient (has no administration in time range)  warfarin (COUMADIN) tablet 1 mg (has no administration in time range)  warfarin (COUMADIN) tablet 2 mg (has no administration in time range)  levothyroxine (SYNTHROID, LEVOTHROID) tablet 25 mcg (has no administration in time range)  sodium chloride 0.9 % bolus 500 mL (0 mLs Intravenous Stopped 01/10/19 0100)  cefTRIAXone (ROCEPHIN) 1 g in sodium chloride 0.9 % 100 mL IVPB (0 g  Intravenous Stopped 01/10/19 0206)  azithromycin (ZITHROMAX) 500 mg in sodium chloride 0.9 % 250 mL IVPB (0 mg Intravenous Stopped 01/10/19 0348)  sodium chloride 0.9 % bolus 500 mL (0 mLs Intravenous Stopped 01/10/19 0849)     Note:  This document was prepared using Dragon voice recognition software and may include unintentional dictation errors.  Nanda Quinton, MD Emergency Medicine    Long, [facility], MD 01/10/19 [phone]

## 2019-01-09 NOTE — Telephone Encounter (Signed)
I received a page from pt's daughter, Heide Scales. I called her back and she reports that pt is very weak and unable to stand. Her neighbor is an Administrator, [employer], Dr. Arnoldo Morale and is with the patient. He has assessed her and notes BP 127/78, HR 78 and irregular. He says that neurologically she has no focal deficits. He thinks she looks anemic with pale conjunctiva and slow cap refill. He says that she was at the hospital with her husband today and was out all day. Upon returning home while trying to get into the house she had a fall. No significant injury except now has sore wrist. He is concerned about her afib meds. I advised that with her being so weak and unable to stand and having fallen she should be evaluated in the ED. He and the patient's family agree and will call EMS.   I will route to Dr. Caryl Comes for his information.   Daune Perch, AGNP-C [facility] 01/23/2019  8:10 PM

## 2019-01-10 ENCOUNTER — Observation Stay (HOSPITAL_COMMUNITY): Payer: Medicare Other

## 2019-01-10 ENCOUNTER — Observation Stay (HOSPITAL_BASED_OUTPATIENT_CLINIC_OR_DEPARTMENT_OTHER): Payer: Medicare Other

## 2019-01-10 ENCOUNTER — Encounter (HOSPITAL_COMMUNITY): Payer: Self-pay | Admitting: Internal Medicine

## 2019-01-10 DIAGNOSIS — I130 Hypertensive heart and chronic kidney disease with heart failure and stage 1 through stage 4 chronic kidney disease, or unspecified chronic kidney disease: Secondary | ICD-10-CM | POA: Diagnosis present

## 2019-01-10 DIAGNOSIS — G9349 Other encephalopathy: Secondary | ICD-10-CM | POA: Diagnosis not present

## 2019-01-10 DIAGNOSIS — D649 Anemia, unspecified: Secondary | ICD-10-CM | POA: Diagnosis present

## 2019-01-10 DIAGNOSIS — J189 Pneumonia, unspecified organism: Secondary | ICD-10-CM | POA: Diagnosis present

## 2019-01-10 DIAGNOSIS — I2729 Other secondary pulmonary hypertension: Secondary | ICD-10-CM | POA: Diagnosis present

## 2019-01-10 DIAGNOSIS — I4819 Other persistent atrial fibrillation: Secondary | ICD-10-CM | POA: Diagnosis present

## 2019-01-10 DIAGNOSIS — I5033 Acute on chronic diastolic (congestive) heart failure: Secondary | ICD-10-CM | POA: Diagnosis present

## 2019-01-10 DIAGNOSIS — I340 Nonrheumatic mitral (valve) insufficiency: Secondary | ICD-10-CM

## 2019-01-10 DIAGNOSIS — W19XXXA Unspecified fall, initial encounter: Secondary | ICD-10-CM

## 2019-01-10 DIAGNOSIS — I5082 Biventricular heart failure: Secondary | ICD-10-CM | POA: Diagnosis present

## 2019-01-10 DIAGNOSIS — R0602 Shortness of breath: Secondary | ICD-10-CM | POA: Diagnosis present

## 2019-01-10 DIAGNOSIS — I480 Paroxysmal atrial fibrillation: Secondary | ICD-10-CM

## 2019-01-10 DIAGNOSIS — W1843XA Slipping, tripping and stumbling without falling due to stepping from one level to another, initial encounter: Secondary | ICD-10-CM | POA: Diagnosis present

## 2019-01-10 DIAGNOSIS — J918 Pleural effusion in other conditions classified elsewhere: Secondary | ICD-10-CM | POA: Diagnosis present

## 2019-01-10 DIAGNOSIS — J9601 Acute respiratory failure with hypoxia: Secondary | ICD-10-CM | POA: Diagnosis present

## 2019-01-10 DIAGNOSIS — R748 Abnormal levels of other serum enzymes: Secondary | ICD-10-CM | POA: Diagnosis present

## 2019-01-10 DIAGNOSIS — Z66 Do not resuscitate: Secondary | ICD-10-CM | POA: Diagnosis present

## 2019-01-10 DIAGNOSIS — A419 Sepsis, unspecified organism: Secondary | ICD-10-CM | POA: Diagnosis not present

## 2019-01-10 DIAGNOSIS — E785 Hyperlipidemia, unspecified: Secondary | ICD-10-CM | POA: Diagnosis present

## 2019-01-10 DIAGNOSIS — Z9049 Acquired absence of other specified parts of digestive tract: Secondary | ICD-10-CM | POA: Diagnosis not present

## 2019-01-10 DIAGNOSIS — J181 Lobar pneumonia, unspecified organism: Secondary | ICD-10-CM | POA: Diagnosis not present

## 2019-01-10 DIAGNOSIS — Z7189 Other specified counseling: Secondary | ICD-10-CM | POA: Diagnosis not present

## 2019-01-10 DIAGNOSIS — I361 Nonrheumatic tricuspid (valve) insufficiency: Secondary | ICD-10-CM | POA: Diagnosis not present

## 2019-01-10 DIAGNOSIS — E039 Hypothyroidism, unspecified: Secondary | ICD-10-CM | POA: Diagnosis present

## 2019-01-10 DIAGNOSIS — Z79899 Other long term (current) drug therapy: Secondary | ICD-10-CM | POA: Diagnosis not present

## 2019-01-10 DIAGNOSIS — I959 Hypotension, unspecified: Secondary | ICD-10-CM | POA: Diagnosis not present

## 2019-01-10 DIAGNOSIS — N3000 Acute cystitis without hematuria: Secondary | ICD-10-CM

## 2019-01-10 DIAGNOSIS — J90 Pleural effusion, not elsewhere classified: Secondary | ICD-10-CM | POA: Diagnosis not present

## 2019-01-10 DIAGNOSIS — Z515 Encounter for palliative care: Secondary | ICD-10-CM | POA: Diagnosis not present

## 2019-01-10 DIAGNOSIS — N179 Acute kidney failure, unspecified: Secondary | ICD-10-CM | POA: Diagnosis present

## 2019-01-10 DIAGNOSIS — Z8249 Family history of ischemic heart disease and other diseases of the circulatory system: Secondary | ICD-10-CM | POA: Diagnosis not present

## 2019-01-10 DIAGNOSIS — R531 Weakness: Secondary | ICD-10-CM | POA: Diagnosis not present

## 2019-01-10 DIAGNOSIS — N183 Chronic kidney disease, stage 3 unspecified: Secondary | ICD-10-CM | POA: Diagnosis present

## 2019-01-10 DIAGNOSIS — R0603 Acute respiratory distress: Secondary | ICD-10-CM | POA: Diagnosis not present

## 2019-01-10 DIAGNOSIS — R6521 Severe sepsis with septic shock: Secondary | ICD-10-CM | POA: Diagnosis not present

## 2019-01-10 DIAGNOSIS — Z950 Presence of cardiac pacemaker: Secondary | ICD-10-CM | POA: Diagnosis not present

## 2019-01-10 DIAGNOSIS — N189 Chronic kidney disease, unspecified: Secondary | ICD-10-CM

## 2019-01-10 DIAGNOSIS — Z7901 Long term (current) use of anticoagulants: Secondary | ICD-10-CM | POA: Diagnosis not present

## 2019-01-10 LAB — CBC WITH DIFFERENTIAL/PLATELET
Abs Immature Granulocytes: 0.05 10*3/uL (ref 0.00–0.07)
Basophils Absolute: 0 10*3/uL (ref 0.0–0.1)
Basophils Relative: 0 %
Eosinophils Absolute: 0.1 10*3/uL (ref 0.0–0.5)
Eosinophils Relative: 1 %
HCT: 40.9 % (ref 36.0–46.0)
HEMOGLOBIN: 11.7 g/dL — AB (ref 12.0–15.0)
Immature Granulocytes: 1 %
Lymphocytes Relative: 21 %
Lymphs Abs: 1.7 10*3/uL (ref 0.7–4.0)
MCH: 23.3 pg — ABNORMAL LOW (ref 26.0–34.0)
MCHC: 28.6 g/dL — ABNORMAL LOW (ref 30.0–36.0)
MCV: 81.3 fL (ref 80.0–100.0)
MONOS PCT: 9 %
Monocytes Absolute: 0.8 10*3/uL (ref 0.1–1.0)
Neutro Abs: 5.5 10*3/uL (ref 1.7–7.7)
Neutrophils Relative %: 68 %
Platelets: 233 10*3/uL (ref 150–400)
RBC: 5.03 MIL/uL (ref 3.87–5.11)
RDW: 16.4 % — ABNORMAL HIGH (ref 11.5–15.5)
WBC: 8.2 10*3/uL (ref 4.0–10.5)
nRBC: 0.4 % — ABNORMAL HIGH (ref 0.0–0.2)

## 2019-01-10 LAB — BASIC METABOLIC PANEL
Anion gap: 12 (ref 5–15)
BUN: 45 mg/dL — AB (ref 8–23)
CO2: 21 mmol/L — ABNORMAL LOW (ref 22–32)
Calcium: 8.4 mg/dL — ABNORMAL LOW (ref 8.9–10.3)
Chloride: 106 mmol/L (ref 98–111)
Creatinine, Ser: 1.81 mg/dL — ABNORMAL HIGH (ref 0.44–1.00)
GFR calc Af Amer: 27 mL/min — ABNORMAL LOW (ref >60)
GFR calc non Af Amer: 24 mL/min — ABNORMAL LOW (ref >60)
Glucose, Bld: 101 mg/dL — ABNORMAL HIGH (ref 70–99)
POTASSIUM: 4.1 mmol/L (ref 3.5–5.1)
Sodium: 139 mmol/L (ref 135–145)

## 2019-01-10 LAB — URINALYSIS, ROUTINE W REFLEX MICROSCOPIC
Bilirubin Urine: NEGATIVE
Glucose, UA: NEGATIVE mg/dL
Hgb urine dipstick: NEGATIVE
Ketones, ur: NEGATIVE mg/dL
Nitrite: NEGATIVE
PROTEIN: NEGATIVE mg/dL
Specific Gravity, Urine: 1.017 (ref 1.005–1.030)
pH: 5 (ref 5.0–8.0)

## 2019-01-10 LAB — COMPREHENSIVE METABOLIC PANEL
ALK PHOS: 175 U/L — AB (ref 38–126)
ALT: 22 U/L (ref 0–44)
AST: 20 U/L (ref 15–41)
Albumin: 2.3 g/dL — ABNORMAL LOW (ref 3.5–5.0)
Anion gap: 12 (ref 5–15)
BILIRUBIN TOTAL: 1.1 mg/dL (ref 0.3–1.2)
BUN: 46 mg/dL — ABNORMAL HIGH (ref 8–23)
CO2: 25 mmol/L (ref 22–32)
Calcium: 8.4 mg/dL — ABNORMAL LOW (ref 8.9–10.3)
Chloride: 102 mmol/L (ref 98–111)
Creatinine, Ser: 1.96 mg/dL — ABNORMAL HIGH (ref 0.44–1.00)
GFR calc Af Amer: 25 mL/min — ABNORMAL LOW (ref >60)
GFR calc non Af Amer: 22 mL/min — ABNORMAL LOW (ref >60)
Glucose, Bld: 117 mg/dL — ABNORMAL HIGH (ref 70–99)
Potassium: 4 mmol/L (ref 3.5–5.1)
Sodium: 139 mmol/L (ref 135–145)
TOTAL PROTEIN: 6.3 g/dL — AB (ref 6.5–8.1)

## 2019-01-10 LAB — PROTIME-INR
INR: 2.59
INR: 2.67
Prothrombin Time: 27.4 seconds — ABNORMAL HIGH (ref 11.4–15.2)
Prothrombin Time: 28 seconds — ABNORMAL HIGH (ref 11.4–15.2)

## 2019-01-10 LAB — ECHOCARDIOGRAM COMPLETE
Height: 67 in
Weight: 2617.3 oz

## 2019-01-10 LAB — OSMOLALITY, URINE: OSMOLALITY UR: 563 mosm/kg (ref 300–900)

## 2019-01-10 LAB — T4, FREE: Free T4: 1.54 ng/dL (ref 0.82–1.77)

## 2019-01-10 LAB — TROPONIN I: Troponin I: <0.03 ng/mL (ref <0.03)

## 2019-01-10 LAB — BRAIN NATRIURETIC PEPTIDE: B Natriuretic Peptide: 982.2 pg/mL — ABNORMAL HIGH (ref 0.0–100.0)

## 2019-01-10 LAB — SODIUM, URINE, RANDOM: Sodium, Ur: <10 mmol/L

## 2019-01-10 LAB — TSH: TSH: 6.975 u[IU]/mL — ABNORMAL HIGH (ref 0.350–4.500)

## 2019-01-10 LAB — MAGNESIUM: MAGNESIUM: 2.3 mg/dL (ref 1.7–2.4)

## 2019-01-10 MED ORDER — SODIUM CHLORIDE 0.9 % IV SOLN
500.0000 mg | Freq: Once | INTRAVENOUS | Status: AC
  Administered 2019-01-10: 500 mg via INTRAVENOUS
  Filled 2019-01-10: qty 500

## 2019-01-10 MED ORDER — LEVOTHYROXINE SODIUM 25 MCG PO TABS
25.0000 ug | ORAL_TABLET | Freq: Every day | ORAL | Status: DC
  Administered 2019-01-10 – 2019-01-14 (×4): 25 ug via ORAL
  Filled 2019-01-10 (×6): qty 1

## 2019-01-10 MED ORDER — FUROSEMIDE 10 MG/ML IJ SOLN: 20.0000 mg | Freq: Once | INTRAMUSCULAR | Status: DC

## 2019-01-10 MED ORDER — SODIUM CHLORIDE 0.9 % IV BOLUS
500.0000 mL | Freq: Once | INTRAVENOUS | Status: AC
  Administered 2019-01-10: 500 mL via INTRAVENOUS

## 2019-01-10 MED ORDER — FUROSEMIDE 10 MG/ML IJ SOLN
40.0000 mg | Freq: Once | INTRAMUSCULAR | Status: AC
  Administered 2019-01-10: 40 mg via INTRAVENOUS
  Filled 2019-01-10: qty 4

## 2019-01-10 MED ORDER — SODIUM CHLORIDE 0.9 % IV SOLN
1.0000 g | Freq: Once | INTRAVENOUS | Status: AC
  Administered 2019-01-10: 1 g via INTRAVENOUS
  Filled 2019-01-10: qty 10

## 2019-01-10 MED ORDER — WARFARIN SODIUM 1 MG PO TABS
1.0000 mg | ORAL_TABLET | ORAL | Status: DC
  Administered 2019-01-11 – 2019-01-13 (×2): 1 mg via ORAL
  Filled 2019-01-10 (×3): qty 1

## 2019-01-10 MED ORDER — METOPROLOL SUCCINATE ER 100 MG PO TB24: 100.0000 mg | ORAL_TABLET | Freq: Two times a day (BID) | ORAL | Status: DC

## 2019-01-10 MED ORDER — ACETAMINOPHEN 650 MG RE SUPP: 650.0000 mg | Freq: Four times a day (QID) | RECTAL | Status: DC | PRN

## 2019-01-10 MED ORDER — SODIUM CHLORIDE 0.9 % IV SOLN
500.0000 mg | INTRAVENOUS | Status: DC
  Administered 2019-01-11 – 2019-01-12 (×2): 500 mg via INTRAVENOUS
  Filled 2019-01-10 (×2): qty 500

## 2019-01-10 MED ORDER — AMIODARONE HCL 200 MG PO TABS
200.0000 mg | ORAL_TABLET | Freq: Two times a day (BID) | ORAL | Status: DC
  Administered 2019-01-10 – 2019-01-14 (×9): 200 mg via ORAL
  Filled 2019-01-10 (×10): qty 1

## 2019-01-10 MED ORDER — SODIUM CHLORIDE 0.9 % IV SOLN
1.0000 g | INTRAVENOUS | Status: DC
  Administered 2019-01-11 – 2019-01-13 (×3): 1 g via INTRAVENOUS
  Filled 2019-01-10 (×3): qty 10

## 2019-01-10 MED ORDER — ACETAMINOPHEN 325 MG PO TABS: 650.0000 mg | ORAL_TABLET | Freq: Four times a day (QID) | ORAL | Status: DC | PRN

## 2019-01-10 MED ORDER — WARFARIN - PHARMACIST DOSING INPATIENT
Freq: Every day | Status: DC
  Administered 2019-01-13: 19:00:00

## 2019-01-10 MED ORDER — WARFARIN SODIUM 2 MG PO TABS
2.0000 mg | ORAL_TABLET | ORAL | Status: DC
  Administered 2019-01-10 – 2019-01-12 (×2): 2 mg via ORAL
  Filled 2019-01-10 (×3): qty 1

## 2019-01-10 MED ORDER — METOPROLOL SUCCINATE ER 100 MG PO TB24
100.0000 mg | ORAL_TABLET | Freq: Two times a day (BID) | ORAL | Status: DC
  Administered 2019-01-10 – 2019-01-14 (×9): 100 mg via ORAL
  Filled 2019-01-10 (×10): qty 1

## 2019-01-10 NOTE — H&P (Signed)
History and Physical    Anne Macias YTK:[id number] DOB: [birth year] DOA: 01/08/2019  PCP: Lavone Orn, MD Patient coming from: Home  Chief Complaint: Fall  HPI: Anne Macias is a 85 y.o. female with medical history significant of paroxysmal atrial fibrillation on amiodarone and Coumadin, tachy brady syndrome, status post PPM, hypertension, hyperlipidemia presenting to the hospital for evaluation of generalized weakness and a fall at home.  Patient states her legs gave out while climbing 3 steps in her garage and she fell on the ground.  Denies hitting her head or sustaining any injuries from the fall.  Husband at bedside states patient did not lose consciousness.  Family and a neighbor helped her up as she was too weak to stand up on her own.  Husband states over a week ago dose of her home amiodarone was increased.  Patient reports having chronic swelling in her legs which has not changed.  Denies having any orthopnea or shortness of breath.  Denies having any cough or chest pain.  Denies having any fevers or chills.  Denies having any dysuria, urinary frequency, or urgency.  Denies having any nausea, vomiting, or diarrhea.  No other complaints.  Review of Systems: As per HPI otherwise 10 point review of systems negative.  Past Medical History:  Diagnosis Date  . Chronic anticoagulation   . Edema of lower extremity   . Hyperlipidemia   . Hypertension   . PAF (paroxysmal atrial fibrillation) ([place]) 1998  . S/P cardiac pacemaker procedure Sept 5573   complicated by pocket hematoma  . Tachy-brady syndrome ([place])   . Tremor 05/08/2014  . Visual disturbance     Past Surgical History:  Procedure Laterality Date  . CARDIOVERSION N/A 02/26/2014   Procedure: CARDIOVERSION;  Surgeon: Deboraha Sprang, MD;  Location: [facility] ENDOSCOPY;  Service: Cardiovascular;  Laterality: N/A;  . CARDIOVERSION N/A 03/19/2014   Procedure: CARDIOVERSION;  Surgeon: Jerica Spark, MD;  Location: [facility] ENDOSCOPY;   Service: Cardiovascular;  Laterality: N/A;  . CARDIOVERSION N/A 06/17/2015   Procedure: CARDIOVERSION;  Surgeon: Shigeko Spark, MD;  Location: [facility];  Service: Cardiovascular;  Laterality: N/A;  . CATARACT EXTRACTION    . CHOLECYSTECTOMY  2005  . INSERT / REPLACE / REMOVE PACEMAKER  08/28/2010   implantaton  . US ECHOCARDIOGRAPHY  04/10/2010   EF 55-60%     reports that she has never smoked. She has never used smokeless tobacco. She reports current alcohol use of about 7.0 standard drinks of alcohol per week. She reports that she does not use drugs.  No Known Allergies  Family History  Problem Relation Age of Onset  . Heart attack Mother   . Pneumonia Father   . Heart attack Brother   . Alcohol abuse Brother     Prior to Admission medications   Medication Sig Start Date End Date Taking? Authorizing Provider  alendronate (FOSAMAX) 70 MG tablet TAKE 1 TABLET BY MOUTH ONCE A WEEK WITH A FULL GLASS OF WATER ON AN EMPTY STOMACH Patient taking differently: Take 70 mg by mouth every Sunday. WITH A FULL GLASS OF WATER ON AN EMPTY STOMACH 10/26/18  Yes Deveshwar, Abel Presto, MD  amiodarone (PACERONE) 200 MG tablet Take 1 tablet (200 mg total) by mouth 2 (two) times daily. 12/27/18  Yes Fenton, Clint R, PA  Calcium Carbonate-Vitamin D (CALTRATE 600+D PO) Take 1 tablet by mouth at bedtime.    Yes [provider]  CLINPRO 5000 1.1 % PSTE 1 application daily. As  directed 10/25/18  Yes [provider]  furosemide (LASIX) 80 MG tablet Take 40-80 mg by mouth daily. Take 80 mg by mouth every other day, alternating with 40 mg   Yes [provider]  KLOR-CON M20 20 MEQ tablet Take 10-20 mEq by mouth See admin instructions. Take 20 mEq by mouth every other day, alternating with 10 mEq 03/04/17  Yes [provider]  metoprolol succinate (TOPROL-XL) 100 MG 24 hr tablet Take 1 tablet (100 mg total) by mouth 2 (two) times daily. 01/03/19  Yes Deboraha Sprang, MD  Polyethyl  Glycol-Propyl Glycol (SYSTANE OP) Place 1 drop into both eyes at bedtime.    Yes [provider]  warfarin (COUMADIN) 2 MG tablet Take 1-2 mg by mouth See admin instructions. Take 2 mg by mouth at bedtime on Sun/Tues/Thurs/Sat and 1 mg on Mon/Wed/Fri 04/30/15  Yes [provider]    Physical Exam: Vitals:   01/10/19 0355 01/10/19 0400 01/10/19 0415 01/10/19 0500  BP: (!) 88/60 (!) 88/54 99/65 103/71  Pulse:    80  Resp: (!) 23 19 17  (!) 24  Temp:      TempSrc:      SpO2: 97%   95%  Weight:      Height:        Physical Exam  Constitutional: She is oriented to person, place, and time. No distress.  HENT:  Head: Normocephalic.  Mouth/Throat: Oropharynx is clear and moist.  Eyes: Pupils are equal, round, and reactive to light. EOM are normal. Right eye exhibits no discharge. Left eye exhibits no discharge.  Neck: Neck supple.  Cardiovascular: Normal rate, regular rhythm and intact distal pulses.  Pulmonary/Chest: Effort normal. No respiratory distress. She has no wheezes. She has no rales.  Decreased breath sounds at the bases  Abdominal: Soft. Bowel sounds are normal. She exhibits no distension. There is no abdominal tenderness. There is no guarding.  Musculoskeletal:        General: Edema present.     Comments: +3 pitting edema of bilateral lower extremities  Neurological: She is alert and oriented to person, place, and time. No cranial nerve deficit.  Strength 5 out of 5 in bilateral upper and lower extremities.  Sensation to light touch intact throughout.  Speech fluent, tongue midline, no facial droop.  Skin: Skin is dry. She is not diaphoretic.  Extremities cool to touch but pulses intact     Labs on Admission: I have personally reviewed following labs and imaging studies  CBC: Recent Labs  Lab 01/06/2019 2351  WBC 8.2  NEUTROABS 5.5  HGB 11.7*  HCT 40.9  MCV 81.3  PLT 540   Basic Metabolic Panel: Recent Labs  Lab 01/20/2019 2351 01/10/19 0252    NA 139 139  K 4.0 4.1  CL 102 106  CO2 25 21*  GLUCOSE 117* 101*  BUN 46* 45*  CREATININE 1.96* 1.81*  CALCIUM 8.4* 8.4*  MG 2.3  --    GFR: Estimated Creatinine Clearance: 18.8 mL/min (A) (by C-G formula based on SCr of 1.81 mg/dL (H)). Liver Function Tests: Recent Labs  Lab 01/20/2019 2351  AST 20  ALT 22  ALKPHOS 175*  BILITOT 1.1  PROT 6.3*  ALBUMIN 2.3*   No results for input(s): LIPASE, AMYLASE in the last 168 hours. No results for input(s): AMMONIA in the last 168 hours. Coagulation Profile: Recent Labs  Lab 01/14/2019 2351 01/10/19 0339  INR 2.67 2.59   Cardiac Enzymes: Recent Labs  Lab 01/23/2019  [place] <0.03   BNP (last 3 results) No results for input(s): PROBNP in the last 8760 hours. HbA1C: No results for input(s): HGBA1C in the last 72 hours. CBG: No results for input(s): GLUCAP in the last 168 hours. Lipid Profile: No results for input(s): CHOL, HDL, LDLCALC, TRIG, CHOLHDL, LDLDIRECT in the last 72 hours. Thyroid Function Tests: Recent Labs    01/12/2019 2351 01/10/19 0144  TSH 6.975*  --   FREET4  --  1.54   Anemia Panel: No results for input(s): VITAMINB12, FOLATE, FERRITIN, TIBC, IRON, RETICCTPCT in the last 72 hours. Urine analysis:    Component Value Date/Time   COLORURINE YELLOW 01/19/2019 2355   APPEARANCEUR HAZY (A) 01/15/2019 2355   LABSPEC 1.017 01/02/2019 2355   PHURINE 5.0 01/14/2019 2355   GLUCOSEU NEGATIVE 01/26/2019 2355   HGBUR NEGATIVE 01/11/2019 2355   BILIRUBINUR NEGATIVE 01/03/2019 2355   KETONESUR NEGATIVE 01/06/2019 2355   PROTEINUR NEGATIVE 01/16/2019 2355   NITRITE NEGATIVE 12/31/2018 2355   LEUKOCYTESUR TRACE (A) 01/01/2019 2355    Radiological Exams on Admission: Dg Chest 2 View  Result Date: 12/31/2018 CLINICAL DATA:  Fall EXAM: CHEST - 2 VIEW COMPARISON:  06/19/2014 FINDINGS: Right-sided dual lead pacing device as before. Small left-sided pleural effusion. Moderate right pleural effusion. Mild  cardiomegaly with aortic atherosclerosis. Ill-defined opacity in the left perihilar region. Consolidation at both bases. No pneumothorax. IMPRESSION: 1. Bilateral pleural effusions, moderate on the right and small on the left. Bibasilar consolidations which may reflect atelectasis or pneumonia. 2. Ill-defined left perihilar region opacity, may reflect infiltrate although mass not excluded. Short interval radiographic follow-up and/or CT may be considered for further evaluation. Electronically Signed   By: Donavan Foil M.D.   On: 01/08/2019 22:34   Ct Chest Wo Contrast  Result Date: 01/10/2019 CLINICAL DATA:  Pleural effusions EXAM: CT CHEST WITHOUT CONTRAST TECHNIQUE: Multidetector CT imaging of the chest was performed following the standard protocol without IV contrast. COMPARISON:  01/13/2019 FINDINGS: Cardiovascular: Aortic and coronary artery calcifications. Tortuosity of the thoracic aorta. No aortic aneurysm. Cardiomegaly. Pacer in place with leads in the right atrium and right ventricle. Mediastinum/Nodes: No mediastinal, hilar, or axillary adenopathy. Lungs/Pleura: Moderate to large right effusion with small to moderate left effusion. Compressive atelectasis in the lower lobes. Ground-glass airspace opacities in both upper lobes, left greater than right. Upper Abdomen: Imaging into the upper abdomen shows no acute findings. Musculoskeletal: Chest wall soft tissues are unremarkable. No acute bony abnormality. IMPRESSION: Bilateral pleural effusions, right larger than left. Compressive atelectasis in the lower lobes. Ground-glass opacities in both upper lobes, left greater than right could reflect pneumonia. Cardiomegaly, coronary artery disease. Aortic Atherosclerosis (ICD10-I70.0). Electronically Signed   By: Rolm Baptise M.D.   On: 01/10/2019 03:21   US Renal  Result Date: 01/10/2019 CLINICAL DATA:  85 year old female with history of acute kidney disease. EXAM: RENAL / URINARY TRACT ULTRASOUND  COMPLETE COMPARISON:  None. FINDINGS: Right Kidney: Renal measurements: 8.9 x 4.6 x 4.4 cm = volume: 93 mL . Echogenicity within normal limits. Diffuse cortical thinning with expansion of the central sinus echo complex. No mass or hydronephrosis visualized. Left Kidney: Renal measurements: 9.8 x 5.5 x 5.5 cm = volume: 154 mL. Echogenicity within normal limits. Diffuse cortical thinning with expansion of the central sinus echo complex. No mass or hydronephrosis visualized. Bladder: Appears normal for degree of bladder distention. IMPRESSION: 1. No hydronephrosis or other acute findings. 2. Mild bilateral renal atrophy. Electronically Signed   By:  Vinnie Langton M.D.   On: 01/10/2019 05:03    EKG: Independently reviewed.  Paced rhythm, nonspecific T wave abnormality, no STEMI.  Assessment/Plan Principal Problem:   Fall Active Problems:   UTI (urinary tract infection)   Generalized weakness   CAP (community acquired pneumonia)   Pleural effusion   AKI (acute kidney injury) ([place])   CKD (chronic kidney disease) stage 3, GFR 30-59 ml/min ([place])   Fall, generalized weakness secondary to suspected UTI and CAP -Afebrile and no leukocytosis.  Blood pressure soft. -UA with trace amount of leukocytes, many bacteria, 11-20 WBCs, and negative nitrite.  Check urine culture. -CT showing groundglass opacities in both upper lobes, left greater than right which could reflect pneumonia. -Continue antibiotic coverage with ceftriaxone and azithromycin. -Patient has chronic anemia.  Hemoglobin at baseline. -Troponin negative.  EKG with paced rhythm making it difficult to interpret.  Patient denies having any chest pain. -Hypothyroidism could also be contributing to generalized weakness.  TSH elevated at 6.975.  Check free T4 level. -Dose of home amiodarone was recently increased which could also be contributing. -PT evaluation -Urine culture pending -Blood culture x2 pending -Gentle IV fluid hydration.  Check  orthostatics.  Pleural effusions Chest CT showing bilateral pleural effusions, right larger than left and compressive atelectasis in the lower lobes.  BNP elevated at 982.  Appears volume overloaded on exam with bilateral lower extremity edema.  Not hypoxic.  No documented history of CHF. -Unable to diurese at this time in the setting of soft blood pressure readings and AKI -Echocardiogram  AKI on CKD 3 BUN 46.  Creatinine 1.9, baseline 1.0.  Renal ultrasound without evidence of hydronephrosis, showing mild bilateral renal atrophy.  UA without evidence of proteinuria. -Gentle IV fluid hydration -Continue to monitor renal function -Check urine sodium, osm  Paroxysmal atrial fibrillation -CHA2DS2VASc 4.  Has a permanent pacemaker. -INR 2.6.  Continue Coumadin for anticoagulation. -Hold amiodarone as recent dose increase might have led to her generalized weakness. -Hold metoprolol in the setting of soft blood pressure readings.  Chronic anemia -Hemoglobin 11.7, at baseline.  Elevated alkaline phosphatase -Alkaline phosphatase 175, remainder of LFTs normal.  Abdominal exam benign. -Patient follow-up  DVT prophylaxis: Coumadin Code Status: Full code.  Discussed with patient and husband. Family Communication: Husband at bedside. Disposition Plan: Anticipate discharge after clinical improvement. Consults called: None Admission status: Observation, telemetry   Anne Leff MD [facility] Pager [phone]  If 7PM-7AM, please contact night-coverage [email or web address] Password [facility]  01/10/2019, 5:49 AM

## 2019-01-10 NOTE — Consult Note (Signed)
Cardiology Consultation:   Patient ID: Anne Macias MRN: [id number]; DOB: [birth year]  Admit date: 01/26/2019 Date of Consult: 01/10/2019  Primary Care Provider: Lavone Orn, MD Primary Cardiologist: No primary care provider on file. Dr. Caryl Comes Primary Electrophysiologist:  None    Patient Profile:   Anne Macias is a 85 y.o. female with a hx of paroxysmal afib on amiodarone and coumadin, tachy-brady syndrome with permanent pacemaker, HTN, and hyperlipidemia who is being seen today for the evaluation of afib at the request of Dr. Eustaquio Boyden.  History of Present Illness:   Ms. Anne Macias presented to the ED on 01/08/2019 for weakness after falling at home, found to have CAP and a UTI. She tripped going up the stairs and was unable to get up due to severe weakness. She denied head injury, LOC and preceding symptoms including lightheadedness, chest pain, and palpitations.  Cardiology was consulted as the patient was recently seen in the afib clinic and her dose of amiodarone was increased from 200 mg QD to BID and there was a concern that this may have contributed to her overall weakness.   She was seen in the afib clinic on 12/27/18. Pacemaker interrogation showed persistent afib the week prior with HR 80s-100s although patient was asymptomatic. Her dose of amiodarone was increased from 200 mg QD to BID. She has previously tried flecainide and propafenone but was unable to tolerate either. She has had 3 cardioversions in 2015-2016. She had a [employer]onic Dual Chamber pacemaker implanted 12/2009 and was seen by EP on 06/2018, recommended replacement about 07/2019. She has a CHADS-VASC score of 4 and is anticoagulated on warfarin with INR of 2.59 on 01/02/2019.   Over the past few weeks and today, patient has been asymptomatic from an afib perspective. She denies palpitations, chest pain, and lightheadedness and felt well before her sudden onset of weakness and fall on 01/20/2019. Her family is concerned about  her afib and is wondering if the rhythm or amiodarone is the cause of her weakness/fall yesterday.   Past Medical History:  Diagnosis Date  . Chronic anticoagulation   . Edema of lower extremity   . Hyperlipidemia   . Hypertension   . Hypothyroidism   . PAF (paroxysmal atrial fibrillation) ([place]) 1998  . S/P cardiac pacemaker procedure Sept 2505   complicated by pocket hematoma  . Tachy-brady syndrome ([place])   . Tremor 05/08/2014  . Visual disturbance     Past Surgical History:  Procedure Laterality Date  . CARDIOVERSION N/A 02/26/2014   Procedure: CARDIOVERSION;  Surgeon: Deboraha Sprang, MD;  Location: [facility] ENDOSCOPY;  Service: Cardiovascular;  Laterality: N/A;  . CARDIOVERSION N/A 03/19/2014   Procedure: CARDIOVERSION;  Surgeon: Christi Spark, MD;  Location: [facility] ENDOSCOPY;  Service: Cardiovascular;  Laterality: N/A;  . CARDIOVERSION N/A 06/17/2015   Procedure: CARDIOVERSION;  Surgeon: Kinnley Spark, MD;  Location: [facility];  Service: Cardiovascular;  Laterality: N/A;  . CATARACT EXTRACTION    . CHOLECYSTECTOMY  2005  . INSERT / REPLACE / REMOVE PACEMAKER  08/28/2010   implantaton  . US ECHOCARDIOGRAPHY  04/10/2010   EF 55-60%     Home Medications:  Prior to Admission medications   Medication Sig Start Date End Date Taking? Authorizing Provider  alendronate (FOSAMAX) 70 MG tablet TAKE 1 TABLET BY MOUTH ONCE A WEEK WITH A FULL GLASS OF WATER ON AN EMPTY STOMACH Patient taking differently: Take 70 mg by mouth every Sunday. WITH A FULL GLASS OF WATER ON AN EMPTY STOMACH  10/26/18  Yes Deveshwar, Abel Presto, MD  amiodarone (PACERONE) 200 MG tablet Take 1 tablet (200 mg total) by mouth 2 (two) times daily. 12/27/18  Yes Fenton, Clint R, PA  Calcium Carbonate-Vitamin D (CALTRATE 600+D PO) Take 1 tablet by mouth at bedtime.    Yes [provider]  CLINPRO 5000 1.1 % PSTE 1 application daily. As directed 10/25/18  Yes [provider]  furosemide (LASIX) 80 MG tablet  Take 40-80 mg by mouth daily. Take 80 mg by mouth every other day, alternating with 40 mg   Yes [provider]  KLOR-CON M20 20 MEQ tablet Take 10-20 mEq by mouth See admin instructions. Take 20 mEq by mouth every other day, alternating with 10 mEq 03/04/17  Yes [provider]  metoprolol succinate (TOPROL-XL) 100 MG 24 hr tablet Take 1 tablet (100 mg total) by mouth 2 (two) times daily. 01/03/19  Yes Deboraha Sprang, MD  Polyethyl Glycol-Propyl Glycol (SYSTANE OP) Place 1 drop into both eyes at bedtime.    Yes [provider]  warfarin (COUMADIN) 2 MG tablet Take 1-2 mg by mouth See admin instructions. Take 2 mg by mouth at bedtime on Sun/Tues/Thurs/Sat and 1 mg on Mon/Wed/Fri 04/30/15  Yes [provider]    Inpatient Medications: Scheduled Meds: . furosemide  40 mg Intravenous Once  . levothyroxine  25 mcg Oral Q0600  . [START ON 01/11/2019] warfarin  1 mg Oral Q M,W,F-1800  . warfarin  2 mg Oral Q T,Th,S,Su-1800  . Warfarin - Pharmacist Dosing Inpatient   Does not apply q1800   Continuous Infusions: . [START ON 01/11/2019] azithromycin    . [START ON 01/11/2019] cefTRIAXone (ROCEPHIN)  IV     PRN Meds: acetaminophen **OR** acetaminophen  Allergies:   No Known Allergies  Social History:   Social History   Socioeconomic History  . Marital status: Married    Spouse name: Not on file  . Number of children: Not on file  . Years of education: Not on file  . Highest education level: Not on file  Occupational History  . Not on file  Social Needs  . Financial resource strain: Not on file  . Food insecurity:    Worry: Not on file    Inability: Not on file  . Transportation needs:    Medical: Not on file    Non-medical: Not on file  Tobacco Use  . Smoking status: Never Smoker  . Smokeless tobacco: Never Used  Substance and Sexual Activity  . Alcohol use: Yes    Alcohol/week: 7.0 standard drinks    Types: 7 Glasses of wine per week    Comment: 5  oz red wine daily at dinner  . Drug use: Never  . Sexual activity: Not on file  Lifestyle  . Physical activity:    Days per week: Not on file    Minutes per session: Not on file  . Stress: Not on file  Relationships  . Social connections:    Talks on phone: Not on file    Gets together: Not on file    Attends religious service: Not on file    Active member of club or organization: Not on file    Attends meetings of clubs or organizations: Not on file    Relationship status: Not on file  . Intimate partner violence:    Fear of current or ex partner: Not on file    Emotionally abused: Not on file    Physically abused:  Not on file    Forced sexual activity: Not on file  Other Topics Concern  . Not on file  Social History Narrative  . Not on file    Family History:   Family History  Problem Relation Age of Onset  . Heart attack Mother   . Pneumonia Father   . Heart attack Brother   . Alcohol abuse Brother      ROS:  Please see the history of present illness.  All other ROS reviewed and negative.     Physical Exam/Data:   Vitals:   01/10/19 0500 01/10/19 0601 01/10/19 0800 01/10/19 1230  BP: 103/71 113/69 116/66 114/67  Pulse: 80 84 76 68  Resp: (!) 24 (!) 23 20 18   Temp:   97.9 F (36.6 C) 98.2 F (36.8 C)  TempSrc:   Axillary Oral  SpO2: 95% 96% 98% 98%  Weight:  74.2 kg    Height:  5\' 7"  (1.702 m)      Intake/Output Summary (Last 24 hours) at 01/10/2019 1657 Last data filed at 01/10/2019 1500 Gross per 24 hour  Intake 240 ml  Output -  Net 240 ml   Last 3 Weights 01/10/2019 01/15/2019 12/27/2018  Weight (lbs) 163 lb 9.3 oz 135 lb 155 lb  Weight (kg) 74.2 kg 61.236 kg 70.308 kg     Body mass index is 25.62 kg/m.   General:  Well nourished, well developed, in no acute distress. Neck: No JVD Endocrine:  No thryomegaly Vascular: No carotid bruits Cardiac: Irregularly irregular rhythm with HR in the 80s. No murmurs, rubs, or gallops. Lungs: Breaths are  non-labored. No wheezing, ronchi, or rales. Ext: 2+ pitting edema L LE, 1+ R LE. Calves are non-tender. Skin: Warm and dry  Neuro:  CNs 2-12 intact, no focal abnormalities noted Psych:  Normal affect   EKG:  The EKG was personally reviewed and demonstrates:   Telemetry:  Telemetry was personally reviewed and demonstrates:  Paroxysmal atrial fibrillation with HR 80s-90s. Ventricular pacing.   Relevant CV Studies: Echo 01/10/2019:  Laboratory Data:  Chemistry Recent Labs  Lab 01/23/2019 2351 01/10/19 0252  NA 139 139  K 4.0 4.1  CL 102 106  CO2 25 21*  GLUCOSE 117* 101*  BUN 46* 45*  CREATININE 1.96* 1.81*  CALCIUM 8.4* 8.4*  GFRNONAA 22* 24*  GFRAA 25* 27*  ANIONGAP 12 12    Recent Labs  Lab 01/16/2019 2351  PROT 6.3*  ALBUMIN 2.3*  AST 20  ALT 22  ALKPHOS 175*  BILITOT 1.1   Hematology Recent Labs  Lab 01/22/2019 2351  WBC 8.2  RBC 5.03  HGB 11.7*  HCT 40.9  MCV 81.3  MCH 23.3*  MCHC 28.6*  RDW 16.4*  PLT 233   Cardiac Enzymes Recent Labs  Lab 01/27/2019 2351  TROPONINI <0.03   No results for input(s): TROPIPOC in the last 168 hours.  BNP Recent Labs  Lab 01/12/2019 2351  BNP 982.2*    DDimer No results for input(s): DDIMER in the last 168 hours.  Radiology/Studies:  Dg Chest 2 View  Result Date: 01/05/2019 CLINICAL DATA:  Fall EXAM: CHEST - 2 VIEW COMPARISON:  06/19/2014 FINDINGS: Right-sided dual lead pacing device as before. Small left-sided pleural effusion. Moderate right pleural effusion. Mild cardiomegaly with aortic atherosclerosis. Ill-defined opacity in the left perihilar region. Consolidation at both bases. No pneumothorax. IMPRESSION: 1. Bilateral pleural effusions, moderate on the right and small on the left. Bibasilar consolidations which may reflect atelectasis or pneumonia.  2. Ill-defined left perihilar region opacity, may reflect infiltrate although mass not excluded. Short interval radiographic follow-up and/or CT may be considered for  further evaluation. Electronically Signed   By: Donavan Foil M.D.   On: 01/20/2019 22:34   Ct Chest Wo Contrast  Result Date: 01/10/2019 CLINICAL DATA:  Pleural effusions EXAM: CT CHEST WITHOUT CONTRAST TECHNIQUE: Multidetector CT imaging of the chest was performed following the standard protocol without IV contrast. COMPARISON:  01/08/2019 FINDINGS: Cardiovascular: Aortic and coronary artery calcifications. Tortuosity of the thoracic aorta. No aortic aneurysm. Cardiomegaly. Pacer in place with leads in the right atrium and right ventricle. Mediastinum/Nodes: No mediastinal, hilar, or axillary adenopathy. Lungs/Pleura: Moderate to large right effusion with small to moderate left effusion. Compressive atelectasis in the lower lobes. Ground-glass airspace opacities in both upper lobes, left greater than right. Upper Abdomen: Imaging into the upper abdomen shows no acute findings. Musculoskeletal: Chest wall soft tissues are unremarkable. No acute bony abnormality. IMPRESSION: Bilateral pleural effusions, right larger than left. Compressive atelectasis in the lower lobes. Ground-glass opacities in both upper lobes, left greater than right could reflect pneumonia. Cardiomegaly, coronary artery disease. Aortic Atherosclerosis (ICD10-I70.0). Electronically Signed   By: Rolm Baptise M.D.   On: 01/10/2019 03:21   US Renal  Result Date: 01/10/2019 CLINICAL DATA:  85 year old female with history of acute kidney disease. EXAM: RENAL / URINARY TRACT ULTRASOUND COMPLETE COMPARISON:  None. FINDINGS: Right Kidney: Renal measurements: 8.9 x 4.6 x 4.4 cm = volume: 93 mL . Echogenicity within normal limits. Diffuse cortical thinning with expansion of the central sinus echo complex. No mass or hydronephrosis visualized. Left Kidney: Renal measurements: 9.8 x 5.5 x 5.5 cm = volume: 154 mL. Echogenicity within normal limits. Diffuse cortical thinning with expansion of the central sinus echo complex. No mass or hydronephrosis  visualized. Bladder: Appears normal for degree of bladder distention. IMPRESSION: 1. No hydronephrosis or other acute findings. 2. Mild bilateral renal atrophy. Electronically Signed   By: Vinnie Langton M.D.   On: 01/10/2019 05:03    Assessment and Plan:   1. Paroxysmal atrial fibrillation Patient remains asymptomatic and denies palpitations, chest pain, and lightheadedness. Rate controlled in the 80s-90s. She is appropriately anticoagulated with warfarin to therapeutic INR of 2.6. Afib could be caused by acute HF or exacerbated by recent infectious process. Recommend holding amiodarone as patient is asymptomatic and rate controlled. Could consider cardioversion, but may be best to wait until CAP/UTI are treated to reassess patient's symptoms. Afib may resolve with treatment of infection and/or HF.  - On Toprol XL 100 mg BID at home. Continue to hold metoprolol due to soft blood pressures  - Consider cardioversion in the outpatient setting after acute treatment of infection and HF  2. Acute heart failure On Lasix at home, alternating 40 mg with 80 mg daily, but this has been held since admission due to AKI. Patient appears volume overloaded with bilateral pitting edema, pleural effusions on CXR, and inc BNP of 982. Acute HF could be etiology for afib or vice versa.  - Recommend Lasix 40 mg IV for diuresis. Reevaluate volume status after initial dose. - Awaiting echo results   3. Tachycardia-bradycardia syndrome S/p PPM implanted in 2011.  - Hold metoprolol   3. CAP & UTI Managed by hospitalists with ceftriaxone and azithromycin.  4. AKI Cr 1.81, baseline 1.0. Lasix was previously held due to impaired renal function; however, patient appears volume overloaded and elevated Cr could be due to acute HF.  -  Recommend Lasix 40 mg IV. Repeat BUN/Cr in AM.    For questions or updates, please contact [facility] Please consult [email or web address] for contact info under     Signed, Lyda Jester, PA-C  01/10/2019 4:57 PM

## 2019-01-10 NOTE — Progress Notes (Signed)
ANTICOAGULATION CONSULT NOTE - Initial Consult  Pharmacy Consult for Coumadin Indication: atrial fibrillation  No Known Allergies  Patient Measurements: Height: 5\' 7"  (170.2 cm) Weight: 135 lb (61.2 kg) IBW/kg (Calculated) : 61.6  Vital Signs: Temp: 97.7 F (36.5 C) (02/10 2119) Temp Source: Oral (02/10 2119) BP: 128/85 (02/11 0215) Pulse Rate: 90 (02/11 0100)  Labs: Recent Labs    01/12/2019 2351  HGB 11.7*  HCT 40.9  PLT 233  LABPROT 28.0*  INR 2.67  CREATININE 1.96*  TROPONINI <0.03    Estimated Creatinine Clearance: 17.3 mL/min (A) (by C-G formula based on SCr of 1.96 mg/dL (H)).   Medical History: Past Medical History:  Diagnosis Date  . Chronic anticoagulation   . Edema of lower extremity   . Hyperlipidemia   . Hypertension   . PAF (paroxysmal atrial fibrillation) ([place]) 1998  . S/P cardiac pacemaker procedure Sept 9295   complicated by pocket hematoma  . Tachy-brady syndrome ([place])   . Tremor 05/08/2014  . Visual disturbance     Assessment: 85yo female admitted for weakness w/ possible PNA/UTI, to continue Coumadin for Afib; current INR at goal w/ last dose of Coumadin taken 2/9.  Goal of Therapy:  INR 2-3   Plan:  Will continue home Coumadin regimen of 2mg  TTSS and 1mg  MWF and monitor INR for dose adjustments.  Wynona Neat, PharmD, BCPS  01/10/2019,3:01 AM

## 2019-01-10 NOTE — ED Notes (Signed)
Coldness noted in all 4 extremities. Pt has hx of BLE edema, which is also noted. Fingers are slightly purple. Pt has pulses in all extremities. MD aware

## 2019-01-10 NOTE — Evaluation (Signed)
Physical Therapy Evaluation Patient Details Name: Anne Macias MRN: [id number] DOB: Jun 15, 1923 Today's Date: 01/10/2019   History of Present Illness  Anne Macias is a 85 y.o. female with medical history significant of paroxysmal atrial fibrillation on amiodarone and Coumadin, tachy brady syndrome, status post PPM, hypertension, hyperlipidemia, and back pain presenting to the hospital for evaluation of generalized weakness and a fall at home. Chest CT showed B pleural effusions with compressive atelectasis R>L  Clinical Impression  Pt admitted with above diagnosis. Pt currently with functional limitations due to the deficits listed below (see PT Problem List). Pt presents with LE weakness, swelling and weeping. She is very fearful of falling which is limiting her ability to attempt to mobilize. Would not attempt to stand with 1 person, was mod A to stand when 2nd person standing by. Ambulated 20' with RW and min A with B knee partial buckling. Not safe to be home with husband at this point.  Pt will benefit from skilled PT to increase their independence and safety with mobility to allow discharge to the venue listed below.       Follow Up Recommendations SNF;Supervision/Assistance - 24 hour    Equipment Recommendations  None recommended by PT    Recommendations for Other Services OT consult     Precautions / Restrictions Precautions Precautions: Fall Precaution Comments: pt so fearful of falling she is very reluctant to even try to get up with one person assist Restrictions Weight Bearing Restrictions: No      Mobility  Bed Mobility               General bed mobility comments: pt received sitting on toilet  Transfers Overall transfer level: Needs assistance Equipment used: Rolling walker (2 wheeled) Transfers: Sit to/from Stand Sit to Stand: +2 physical assistance;Mod assist         General transfer comment: attempted to assist pt to stand from toilet but she  kept saying "I can't" and was pulling bkwd when standing facilitated. Attempted 3x and pt continued to be very fearful. RN present and pt stood right up with mod A for support and +2 for safety. Practiced standing from recliner after that 2x from recliner to help pt be more confident. Mod A of 1 each time after first.   Ambulation/Gait Ambulation/Gait assistance: Min assist Gait Distance (Feet): 20 Feet Assistive device: Rolling walker (2 wheeled) Gait Pattern/deviations: Step-through pattern Gait velocity: decreased Gait velocity interpretation: <1.31 ft/sec, indicative of household ambulator General Gait Details: B knee flexion with ambulation, fatigues quickly  Stairs            Wheelchair Mobility    Modified Rankin (Stroke Patients Only)       Balance Overall balance assessment: Needs assistance Sitting-balance support: Bilateral upper extremity supported;Feet supported Sitting balance-Leahy Scale: Fair     Standing balance support: Bilateral upper extremity supported;During functional activity Standing balance-Leahy Scale: Poor Standing balance comment: requires UE support and external min A                             Pertinent Vitals/Pain Pain Assessment: Faces Faces Pain Scale: Hurts a little bit Pain Location: back Pain Descriptors / Indicators: Aching;Constant Pain Intervention(s): Limited activity within patient's tolerance;Monitored during session    Home Living Family/patient expects to be discharged to:: Private residence Living Arrangements: Spouse/significant other Available Help at Discharge: Family;Available 24 hours/day Type of Home: House Home Access: Stairs to enter  Entrance Stairs-Rails: Left Entrance Stairs-Number of Steps: 4 Home Layout: Two level;Able to live on main level with bedroom/bathroom Home Equipment: Gilford Rile - 2 wheels;Cane - single point;Grab bars - toilet;Grab bars - tub/shower      Prior Function Level of  Independence: Independent         Comments: pt reports that she generally ambulates with no AD, husband agrees, though she has RW if she needs it     Hand Dominance   Dominant Hand: Right    Extremity/Trunk Assessment   Upper Extremity Assessment Upper Extremity Assessment: Generalized weakness    Lower Extremity Assessment Lower Extremity Assessment: Generalized weakness(swelling BLE with weeping)    Cervical / Trunk Assessment Cervical / Trunk Assessment: Kyphotic  Communication   Communication: No difficulties  Cognition Arousal/Alertness: Awake/alert Behavior During Therapy: Anxious Overall Cognitive Status: Within Functional Limits for tasks assessed                                 General Comments: pt very fearful of falling      General Comments General comments (skin integrity, edema, etc.): VSS on RA, weeping BLE below knee noted when feet elevated    Exercises General Exercises - Lower Extremity Ankle Circles/Pumps: AROM;Both;10 reps;Seated   Assessment/Plan    PT Assessment Patient needs continued PT services  PT Problem List Decreased strength;Decreased activity tolerance;Decreased balance;Decreased mobility;Decreased knowledge of use of DME;Decreased knowledge of precautions;Pain;Decreased safety awareness       PT Treatment Interventions DME instruction;Gait training;Functional mobility training;Therapeutic activities;Therapeutic exercise;Balance training;Patient/family education;Neuromuscular re-education    PT Goals (Current goals can be found in the Care Plan section)  Acute Rehab PT Goals Patient Stated Goal: get stronger PT Goal Formulation: With patient Time For Goal Achievement: 01/24/19 Potential to Achieve Goals: Fair    Frequency Min 2X/week   Barriers to discharge        Co-evaluation               AM-PAC PT "6 Clicks" Mobility  Outcome Measure Help needed turning from your back to your side while in a  flat bed without using bedrails?: A Little Help needed moving from lying on your back to sitting on the side of a flat bed without using bedrails?: A Little Help needed moving to and from a bed to a chair (including a wheelchair)?: A Lot Help needed standing up from a chair using your arms (e.g., wheelchair or bedside chair)?: A Lot Help needed to walk in hospital room?: A Lot Help needed climbing 3-5 steps with a railing? : Total 6 Click Score: 13    End of Session Equipment Utilized During Treatment: Gait belt Activity Tolerance: Patient limited by fatigue Patient left: in chair;with call bell/phone within reach;with family/visitor present Nurse Communication: Mobility status PT Visit Diagnosis: Unsteadiness on feet (R26.81);Muscle weakness (generalized) (M62.81);History of falling (Z91.81);Difficulty in walking, not elsewhere classified (R26.2);Pain Pain - part of body: (back)    Time: 2671-2458 PT Time Calculation (min) (ACUTE ONLY): 39 min   Charges:   PT Evaluation $PT Eval Moderate Complexity: 1 Mod PT Treatments $Gait Training: 8-22 mins $Neuromuscular Re-education: 8-22 mins        Leighton Roach, PT  Acute Rehab Services  Pager [phone] Office [place] 01/10/2019, 12:08 PM

## 2019-01-10 NOTE — Care Management Note (Signed)
Case Management Note  Patient Details  Name: Anne Macias MRN: [id number] Date of Birth: 1923/11/15  Subjective/Objective:    Pt in with CAP. She is from home with her spouse. DME: cane, walker, shower chair, 3 in 1, wheelchair Denies issues with medications at home and with transportation.                Action/Plan: PT recommending SNF. CM following for d/c disposition.  Expected Discharge Date:                  Expected Discharge Plan:  Skilled Nursing Facility  In-House Referral:  Clinical Social Work  Discharge planning Services     Post Acute Care Choice:    Choice offered to:     DME Arranged:    DME Agency:     HH Arranged:    [place] Agency:     Status of Service:  In process, will continue to follow  If discussed at Long Length of Stay Meetings, dates discussed:    Additional Comments:  Pollie Friar, RN 01/10/2019, 1:20 PM

## 2019-01-10 NOTE — ED Provider Notes (Signed)
12:00 AM  Assumed care from Dr. Laverta Baltimore.  Patient is a 85 year old female with history of atrial fibrillation on amiodarone and Coumadin who lives with her husband at home who was walking up 3 steps to get into her house tonight with her family and could not make at the last step.  Family slowly helped her down to the ground.  She did not fall or strike her head.  Complains of generalized increasing weakness for the past week.  Chest x-ray shows bilateral pleural effusions and left perihilar opacity which may reflect infiltrate.  Labs pending.  Patient will need admission.   1:10 AM  Pt's appears to be infected.  We will send urine culture.  Will cover with ceftriaxone and azithromycin to cover for possible pneumonia and UTI today.  She does tell me she has had some cough but no chest pain, shortness of breath, fevers or chills.  Mildly worsened creatinine from baseline.  She did receive small IV fluid bolus.  Troponin negative.  INR therapeutic.  Will discuss with medicine for admission.   1:58 AM Discussed patient's case with hospitalist, Dr. Marlowe Sax.  I have recommended admission and patient (and family if present) agree with this plan. Admitting physician will place admission orders.   I reviewed all nursing notes, vitals, pertinent previous records, EKGs, lab and urine results, imaging (as available).     EKG Interpretation  Date/Time:  Monday January 09 2019 21:28:41 EST Ventricular Rate:  101 PR Interval:    QRS Duration: 139 QT Interval:  387 QTC Calculation: 502 R Axis:   6 Text Interpretation:  Ventricular-paced complexes Right bundle branch block Nonspecific T abnormalities, lateral leads No STEMI.  Confirmed by Nanda Quinton [phone]) on 01/25/2019 9:42:27 PM         Zhara Gieske, Delice Bison, DO 01/10/19 6045

## 2019-01-10 NOTE — ED Notes (Signed)
Report given to 3W RN. All questions answered.  

## 2019-01-10 NOTE — Progress Notes (Signed)
  Echocardiogram 2D Echocardiogram has been performed.  Anne Macias 01/10/2019, 2:17 PM

## 2019-01-10 NOTE — Progress Notes (Signed)
Pt came in from [facility] ED alert and oriented x 3 but forgetful  transfered to bed from stretcher with  Max assist   'initial assessment done  .Noted Bilateral Lower extremities pitting edema  and elevated on pillows, with extremities cold to touch   Positive pulses present. Cardiac monitor in use  AV paced   Verification completed  Husband at bed side. Needs met no acute distress. VS stable RN will continue to monitor.

## 2019-01-10 NOTE — Progress Notes (Signed)
Patient admitted after midnight.   Presented with sudden generalized weakness to point could not bear weight. Workup revealed cap, uti, pleural effusions, hypothyroidism, aki. Hx afib and amiodorone dose recently changed. Provided with antibiotics, unable to diurese due to soft BP, holding amiodarone, BB. Has pacemaker. Discussed limited code and she and husband agreed. She is stable. Have called EP for consult  PE General awake alert oriented x3. Appears tired CV irregularly irregular trace LE edema Resp: no increased work of breathing. Crackles particularly on right.  Abd: soft +BS no guarding or rebounding MS: joints without swelling/erythema   Radene Gunning, NP

## 2019-01-11 ENCOUNTER — Ambulatory Visit (HOSPITAL_COMMUNITY): Payer: Medicare Other | Admitting: Physician Assistant

## 2019-01-11 DIAGNOSIS — J9601 Acute respiratory failure with hypoxia: Secondary | ICD-10-CM

## 2019-01-11 DIAGNOSIS — Z7901 Long term (current) use of anticoagulants: Secondary | ICD-10-CM

## 2019-01-11 DIAGNOSIS — I5033 Acute on chronic diastolic (congestive) heart failure: Secondary | ICD-10-CM

## 2019-01-11 DIAGNOSIS — I4819 Other persistent atrial fibrillation: Secondary | ICD-10-CM

## 2019-01-11 LAB — PROTIME-INR
INR: 2.94
Prothrombin Time: 30.2 seconds — ABNORMAL HIGH (ref 11.4–15.2)

## 2019-01-11 LAB — BASIC METABOLIC PANEL
Anion gap: 10 (ref 5–15)
BUN: 43 mg/dL — ABNORMAL HIGH (ref 8–23)
CO2: 22 mmol/L (ref 22–32)
Calcium: 7.8 mg/dL — ABNORMAL LOW (ref 8.9–10.3)
Chloride: 105 mmol/L (ref 98–111)
Creatinine, Ser: 1.68 mg/dL — ABNORMAL HIGH (ref 0.44–1.00)
GFR calc non Af Amer: 26 mL/min — ABNORMAL LOW (ref >60)
GFR, EST AFRICAN AMERICAN: 30 mL/min — AB (ref >60)
Glucose, Bld: 112 mg/dL — ABNORMAL HIGH (ref 70–99)
Potassium: 4 mmol/L (ref 3.5–5.1)
Sodium: 137 mmol/L (ref 135–145)

## 2019-01-11 MED ORDER — FUROSEMIDE 10 MG/ML IJ SOLN
40.0000 mg | Freq: Two times a day (BID) | INTRAMUSCULAR | Status: DC
  Administered 2019-01-11 – 2019-01-13 (×5): 40 mg via INTRAVENOUS
  Filled 2019-01-11 (×5): qty 4

## 2019-01-11 NOTE — Progress Notes (Signed)
Progress Note  Patient Name: Anne Macias Date of Encounter: 01/11/2019  Primary Cardiologist: Dr. Caryl Comes  Subjective   No new concerns this AM. Tired. I spoke to her son, Dr. Harlow Mares, this AM about her condition and the plan at her request, and she was glad to hear that everyone is in agreement.  Inpatient Medications    Scheduled Meds: . amiodarone  200 mg Oral BID  . levothyroxine  25 mcg Oral Q0600  . metoprolol succinate  100 mg Oral BID  . warfarin  1 mg Oral Q M,W,F-1800  . warfarin  2 mg Oral Q T,Th,S,Su-1800  . Warfarin - Pharmacist Dosing Inpatient   Does not apply q1800   Continuous Infusions: . azithromycin    . cefTRIAXone (ROCEPHIN)  IV     PRN Meds: acetaminophen **OR** acetaminophen   Vital Signs    Vitals:   01/11/19 0500 01/11/19 0600 01/11/19 0630 01/11/19 0801  BP:   127/82 (!) 125/91  Pulse: 86 81 64 76  Resp: (!) 32   20  Temp:   98.6 F (37 C) 97.6 F (36.4 C)  TempSrc:   Oral Oral  SpO2: (!) 81% (!) 88% 94% 96%  Weight:      Height:        Intake/Output Summary (Last 24 hours) at 01/11/2019 1002 Last data filed at 01/11/2019 0215 Gross per 24 hour  Intake 240 ml  Output 350 ml  Net -110 ml   Last 3 Weights 01/10/2019 01/20/2019 12/27/2018  Weight (lbs) 163 lb 9.3 oz 135 lb 155 lb  Weight (kg) 74.2 kg 61.236 kg 70.308 kg      Telemetry    Atrial fibrillation - Personally Reviewed  ECG    No new since 2/10 - Personally Reviewed  Physical Exam   GEN: No acute distress.   Neck: No JVD appreciated Cardiac: irregularly irregular S1 and S2, no murmurs, rubs, or gallops.  Respiratory: Clear in upper fields, but diminished breath sounds in bilateral bases (1/2 to 1/3 of the way up) GI: Soft, nontender, non-distended  MS: 1-2+ bilateral LE edema; No deformity. Neuro:  Nonfocal  Psych: Normal affect   Labs    Chemistry Recent Labs  Lab 01/08/2019 2351 01/10/19 0252 01/11/19 0618  NA 139 139 137  K 4.0 4.1 4.0  CL 102 106  105  CO2 25 21* 22  GLUCOSE 117* 101* 112*  BUN 46* 45* 43*  CREATININE 1.96* 1.81* 1.68*  CALCIUM 8.4* 8.4* 7.8*  PROT 6.3*  --   --   ALBUMIN 2.3*  --   --   AST 20  --   --   ALT 22  --   --   ALKPHOS 175*  --   --   BILITOT 1.1  --   --   GFRNONAA 22* 24* 26*  GFRAA 25* 27* 30*  ANIONGAP 12 12 10      Hematology Recent Labs  Lab 01/01/2019 2351  WBC 8.2  RBC 5.03  HGB 11.7*  HCT 40.9  MCV 81.3  MCH 23.3*  MCHC 28.6*  RDW 16.4*  PLT 233    Cardiac Enzymes Recent Labs  Lab 01/05/2019 2351  TROPONINI <0.03   No results for input(s): TROPIPOC in the last 168 hours.   BNP Recent Labs  Lab 01/20/2019 2351  BNP 982.2*     DDimer No results for input(s): DDIMER in the last 168 hours.   Radiology    Dg Chest 2 View  Result Date: 01/05/2019 CLINICAL DATA:  Fall EXAM: CHEST - 2 VIEW COMPARISON:  06/19/2014 FINDINGS: Right-sided dual lead pacing device as before. Small left-sided pleural effusion. Moderate right pleural effusion. Mild cardiomegaly with aortic atherosclerosis. Ill-defined opacity in the left perihilar region. Consolidation at both bases. No pneumothorax. IMPRESSION: 1. Bilateral pleural effusions, moderate on the right and small on the left. Bibasilar consolidations which may reflect atelectasis or pneumonia. 2. Ill-defined left perihilar region opacity, may reflect infiltrate although mass not excluded. Short interval radiographic follow-up and/or CT may be considered for further evaluation. Electronically Signed   By: Donavan Foil M.D.   On: 01/24/2019 22:34   Ct Chest Wo Contrast  Result Date: 01/10/2019 CLINICAL DATA:  Pleural effusions EXAM: CT CHEST WITHOUT CONTRAST TECHNIQUE: Multidetector CT imaging of the chest was performed following the standard protocol without IV contrast. COMPARISON:  01/25/2019 FINDINGS: Cardiovascular: Aortic and coronary artery calcifications. Tortuosity of the thoracic aorta. No aortic aneurysm. Cardiomegaly. Pacer in place  with leads in the right atrium and right ventricle. Mediastinum/Nodes: No mediastinal, hilar, or axillary adenopathy. Lungs/Pleura: Moderate to large right effusion with small to moderate left effusion. Compressive atelectasis in the lower lobes. Ground-glass airspace opacities in both upper lobes, left greater than right. Upper Abdomen: Imaging into the upper abdomen shows no acute findings. Musculoskeletal: Chest wall soft tissues are unremarkable. No acute bony abnormality. IMPRESSION: Bilateral pleural effusions, right larger than left. Compressive atelectasis in the lower lobes. Ground-glass opacities in both upper lobes, left greater than right could reflect pneumonia. Cardiomegaly, coronary artery disease. Aortic Atherosclerosis (ICD10-I70.0). Electronically Signed   By: Rolm Baptise M.D.   On: 01/10/2019 03:21   US Renal  Result Date: 01/10/2019 CLINICAL DATA:  85 year old female with history of acute kidney disease. EXAM: RENAL / URINARY TRACT ULTRASOUND COMPLETE COMPARISON:  None. FINDINGS: Right Kidney: Renal measurements: 8.9 x 4.6 x 4.4 cm = volume: 93 mL . Echogenicity within normal limits. Diffuse cortical thinning with expansion of the central sinus echo complex. No mass or hydronephrosis visualized. Left Kidney: Renal measurements: 9.8 x 5.5 x 5.5 cm = volume: 154 mL. Echogenicity within normal limits. Diffuse cortical thinning with expansion of the central sinus echo complex. No mass or hydronephrosis visualized. Bladder: Appears normal for degree of bladder distention. IMPRESSION: 1. No hydronephrosis or other acute findings. 2. Mild bilateral renal atrophy. Electronically Signed   By: Vinnie Langton M.D.   On: 01/10/2019 05:03    Cardiac Studies   Echo 01/10/19  1. The left ventricle has normal systolic function of 46-65%. The cavity size was normal. Left ventricular diastology could not be evaluated secondary to atrial fibrillation.  2. The right ventricle has moderately reduced  systolic function. The cavity was normal. There is no increase in right ventricular wall thickness. Right ventricular systolic pressure is moderately elevated with an estimated pressure of 51.4 mmHg.  3. Left atrial size was moderately dilated.  4. Right atrial size was mildly dilated.  5. The mitral valve is normal in structure. There is mild thickening. There is mild to moderate mitral annular calcification present.  6. The tricuspid valve is normal in structure. Tricuspid valve regurgitation is moderate-severe.  7. The aortic valve is tricuspid There is mild thickening and mild calcification of the aortic valve. Aortic valve regurgitation is trivial by color flow Doppler.  8. The aortic root and ascending aorta are normal in size and structure.  9. The inferior vena cava was dilated in size with <50% respiratory variability.  Patient Profile     85 y.o. female with a hx of paroxysmal afib on amiodarone and coumadin, tachy-brady syndrome with permanent pacemaker, HTN, and hyperlipidemia who is being followed for atrial fibrillation.  Assessment & Plan    1. Paroxysmal atrial fibrillation -remains in afib, on amiodarone 200 mg BID and metoprolol succinate 100 mg BID.  -continue coumadin for anticoagulation -given her acute illness, I recommend monitoring INR inpatient, and then if she goes to rehab facility, documenting INRs once a week. With three weeks of therapeutic INR, we can schedule outpatient cardioversion. Without that, we would need to pursue TEE prior to cardioversion, which given her age and bilateral pleural effusions is higher risk. She and her son are amenable to this plan.  2. Acute on chronic diastolic heart failure Appears volume overloaded, bilateral pleural effusions. Creatitine improving with diuresis, consistent with congestion. - echo done yesterday, as above - continue diuresis, given size of effusions would use 40 mg IV furosemide BID -ins/outs difficult, follow  daily weights.  3. Tachycardia-bradycardia syndrome S/p PPM implanted in 2011.   3. CAP & UTI Managed by hospitalists with ceftriaxone and azithromycin. As noted yesterday, would be cautious of azithromycin use with amiodarone and risk of QTc prolongation.   4. AKI Cr improving with diuresis, supports congestion  TIME SPENT WITH PATIENT: 25 minutes of direct patient care. More than 50% of that time was spent on coordination of care and counseling regarding atrial fibrillation and heart failure.  Buford Dresser, MD, PhD [facility]   For questions or updates, please contact [place] Please consult [email or web address] for contact info under     Signed, Buford Dresser, MD  01/11/2019, 10:02 AM

## 2019-01-11 NOTE — Clinical Social Work Note (Signed)
Clinical Social Work Assessment  Patient Details  Name: Anne Macias MRN: [id number] Date of Birth: [birth year]  Date of referral:  01/10/19               Reason for consult:  Facility Placement                Permission sought to share information with:  Facility Sport and exercise psychologist, Family Supports Permission granted to share information::  Yes, Verbal Permission Granted  Name::     Anne Macias  Agency::  SNF  Relationship::  Spouse, children  Contact Information:     Housing/Transportation Living arrangements for the past 2 months:  Single Family Home Source of Information:  Patient, Medical Team, Adult Children, Spouse Patient Interpreter Needed:  None Criminal Activity/Legal Involvement Pertinent to Current Situation/Hospitalization:  No - Comment as needed Significant Relationships:  Adult Children, Spouse Lives with:  Self, Spouse Do you feel safe going back to the place where you live?  Yes Need for family participation in patient care:  No (Coment)  Care giving concerns:  Patient from home with spouse, but will need short term rehab at discharge. Patient's spouse has a broken arm at this time and is unable to physically assist the patient at home.   Social Worker assessment / plan:  CSW met with patient and family at bedside to discuss recommendation for SNF. Patient's family asked about CIR, and CSW discussed how that was not being recommended at this time. CSW explained SNF placement and discussed expectations, and patient and family in agreement. CSW provided Medicare list of facilities, and family appreciative of information. Family to review facilities to determine preferences, gave CSW permission to fax out referral. CSW to follow.  Employment status:  Retired Nurse, adult PT Recommendations:  [place] / Referral to community resources:  [place]  Patient/Family's Response to care:   Patient and family in agreement with SNF.  Patient/Family's Understanding of and Emotional Response to Diagnosis, Current Treatment, and Prognosis:  Patient's daughter asking about keeping the patient in house for CIR, but understanding that it wasn't being recommended at this time. Patient's daughter and husband appreciative of information, and indicated that the list would be very helpful in narrowing down their search options. Patient discussed how she knows that she's gotten weak and needs some help, will do whatever is being recommended for her so that she can get better.   Emotional Assessment Appearance:  Appears stated age Attitude/Demeanor/Rapport:  Engaged Affect (typically observed):  Pleasant Orientation:  Oriented to Self, Oriented to Place, Oriented to  Time Alcohol / Substance use:  Not Applicable Psych involvement (Current and /or in the community):  No (Comment)  Discharge Needs  Concerns to be addressed:  Care Coordination Readmission within the last 30 days:  No Current discharge risk:  Physical Impairment, Dependent with Mobility Barriers to Discharge:  Continued Medical Work up, [place], [place] 01/11/2019, 10:36 AM

## 2019-01-11 NOTE — Progress Notes (Signed)
ANTICOAGULATION CONSULT NOTE - Initial Consult  Pharmacy Consult for coumadin Indication: atrial fibrillation  No Known Allergies  Patient Measurements: Height: 5\' 7"  (170.2 cm) Weight: 163 lb 9.3 oz (74.2 kg) IBW/kg (Calculated) : 61.6  Vital Signs: Temp: 97.6 F (36.4 C) (02/12 0801) Temp Source: Oral (02/12 0801) BP: 125/91 (02/12 0801) Pulse Rate: 76 (02/12 0801)  Labs: Recent Labs    01/16/2019 2351 01/10/19 0252 01/10/19 0339 01/11/19 0618  HGB 11.7*  --   --   --   HCT 40.9  --   --   --   PLT 233  --   --   --   LABPROT 28.0*  --  27.4* 30.2*  INR 2.67  --  2.59 2.94  CREATININE 1.96* 1.81*  --  1.68*  TROPONINI <0.03  --   --   --     Estimated Creatinine Clearance: 22 mL/min (A) (by C-G formula based on SCr of 1.68 mg/dL (H)).   Medical History: Past Medical History:  Diagnosis Date  . Chronic anticoagulation   . Edema of lower extremity   . Hyperlipidemia   . Hypertension   . Hypothyroidism   . PAF (paroxysmal atrial fibrillation) ([place]) 1998  . S/P cardiac pacemaker procedure Sept 3295   complicated by pocket hematoma  . Tachy-brady syndrome ([place])   . Tremor 05/08/2014  . Visual disturbance     Assessment: 19 YOF admitted for weakness w/ possible PNA/UTI, to continue Coumadin for Afib, INR on admit at goal and last PTA dose 2/9. PTA dosing: 2mg  TTSS and 1mg  MWF  INR upper end of therapeutic at 2.94, last CBC stable 2/10 and no bleeding reported.  Goal of Therapy:  INR 2-3 Monitor platelets by anticoagulation protocol: Yes   Plan:  Continue scheduled home regimen of 2mg  TTSS and 1mg  MWF  Daily INR, s/s bleeding  Bertis Ruddy, PharmD Clinical Pharmacist Please check AMION for all [place] numbers 01/11/2019 8:49 AM

## 2019-01-11 NOTE — Progress Notes (Addendum)
[facility] PROGRESS NOTE    Progress Note  Anne Macias  ZPH:[id number] DOB: 1923/11/27 DOA: 01/11/2019 PCP: Lavone Orn, MD     Brief Narrative:   Anne Macias is an 85 y.o. female past medical history of paroxysmal atrial fibrillation on amiodarone and Coumadin, tachybradycardia syndrome status post pacemaker presents to the hospital for evaluation of weakness and fall at home as per her husband her dose of amiodarone was increased about a week ago  Assessment/Plan:   Fall due to suspected  possible community-acquired pneumonia: At home she was coughing and short of breath, saturation in the hospital are below 90% UA showed many bacteria nitrates negative.  Urine cultures were sent and are pending. CT of the chest showed groundglass opacity in both upper lung left greater than right. Started empirically on IV Rocephin and azithromycin. Sh was mildly elevated at 6.9 in the setting of an infectious etiology will need to be repeated in 6 weeks. Blood cultures and urine cultures are pending.  Acute diastolic heart failure: I agree with cardiology as she seems to be fluid fluid overloaded, she has lower extremity edema,  pleural effusion on x-ray and BNP of 900. Her creatinine is slowly improving with IV Lasix, place TED hose continue IV gentle diuresis. Restrict her fluids, strict I's and O's Daily weights. Back on 28 2020 was around 70.3 kg, on admission 74.2 kg.  Going back through her chart it seems like her estimated dry weight is below 70 kg.  Acute respiratory failure with hypoxia due to community-acquired pneumonia and heart failure: Continue on oxygen. See treatments of primary condition as above.  ACute kidney injury on chronic kidney disease stage III: With a baseline creatinine around 1. Started on gentle IV fluid hydration due to hypotension.  Paroxysmal atrial fibrillation: CHA2DS2VASc 4.,  INR therapeutic continue Coumadin per pharmacy.     Hypothyroidism: Check TSH and T4 in 6 weeks once those have resolved.   DVT prophylaxis: lovenxo Family Communication:none Disposition Plan/Barrier to D/C: home 2-3 days Code Status:     Code Status Orders  (From admission, onward)         Start     Ordered   01/10/19 1052  Limited resuscitation (code)  Continuous    Question Answer Comment  In the event of cardiac or respiratory ARREST: Initiate Code Blue, Call Rapid Response Yes   In the event of cardiac or respiratory ARREST: Perform CPR No   In the event of cardiac or respiratory ARREST: Perform Intubation/Mechanical Ventilation No   In the event of cardiac or respiratory ARREST: Use NIPPV/BiPAp only if indicated Yes   In the event of cardiac or respiratory ARREST: Administer ACLS medications if indicated Yes   In the event of cardiac or respiratory ARREST: Perform Defibrillation or Cardioversion if indicated Yes      01/10/19 1051        Code Status History    Date Active Date Inactive Code Status Order ID Comments User Context   01/10/2019 0250 01/10/2019 1051 Full Code [id number]  Shela Leff, MD ED   07/14/2016 0839 07/16/2016 1520 Full Code [id number]  Radene Gunning, NP Inpatient    Advance Directive Documentation     Most Recent Value  Type of Advance Directive  Out of facility DNR (pink MOST or yellow form)  Pre-existing out of facility DNR order (yellow form or pink MOST form)  -- [current partial code]  "MOST" Form in Place?  -  IV Access:    Peripheral IV   Procedures and diagnostic studies:   Dg Chest 2 View  Result Date: 01/08/2019 CLINICAL DATA:  Fall EXAM: CHEST - 2 VIEW COMPARISON:  06/19/2014 FINDINGS: Right-sided dual lead pacing device as before. Small left-sided pleural effusion. Moderate right pleural effusion. Mild cardiomegaly with aortic atherosclerosis. Ill-defined opacity in the left perihilar region. Consolidation at both bases. No pneumothorax. IMPRESSION: 1. Bilateral  pleural effusions, moderate on the right and small on the left. Bibasilar consolidations which may reflect atelectasis or pneumonia. 2. Ill-defined left perihilar region opacity, may reflect infiltrate although mass not excluded. Short interval radiographic follow-up and/or CT may be considered for further evaluation. Electronically Signed   By: Donavan Foil M.D.   On: 01/25/2019 22:34   Ct Chest Wo Contrast  Result Date: 01/10/2019 CLINICAL DATA:  Pleural effusions EXAM: CT CHEST WITHOUT CONTRAST TECHNIQUE: Multidetector CT imaging of the chest was performed following the standard protocol without IV contrast. COMPARISON:  01/10/2019 FINDINGS: Cardiovascular: Aortic and coronary artery calcifications. Tortuosity of the thoracic aorta. No aortic aneurysm. Cardiomegaly. Pacer in place with leads in the right atrium and right ventricle. Mediastinum/Nodes: No mediastinal, hilar, or axillary adenopathy. Lungs/Pleura: Moderate to large right effusion with small to moderate left effusion. Compressive atelectasis in the lower lobes. Ground-glass airspace opacities in both upper lobes, left greater than right. Upper Abdomen: Imaging into the upper abdomen shows no acute findings. Musculoskeletal: Chest wall soft tissues are unremarkable. No acute bony abnormality. IMPRESSION: Bilateral pleural effusions, right larger than left. Compressive atelectasis in the lower lobes. Ground-glass opacities in both upper lobes, left greater than right could reflect pneumonia. Cardiomegaly, coronary artery disease. Aortic Atherosclerosis (ICD10-I70.0). Electronically Signed   By: Rolm Baptise M.D.   On: 01/10/2019 03:21   US Renal  Result Date: 01/10/2019 CLINICAL DATA:  85 year old female with history of acute kidney disease. EXAM: RENAL / URINARY TRACT ULTRASOUND COMPLETE COMPARISON:  None. FINDINGS: Right Kidney: Renal measurements: 8.9 x 4.6 x 4.4 cm = volume: 93 mL . Echogenicity within normal limits. Diffuse cortical  thinning with expansion of the central sinus echo complex. No mass or hydronephrosis visualized. Left Kidney: Renal measurements: 9.8 x 5.5 x 5.5 cm = volume: 154 mL. Echogenicity within normal limits. Diffuse cortical thinning with expansion of the central sinus echo complex. No mass or hydronephrosis visualized. Bladder: Appears normal for degree of bladder distention. IMPRESSION: 1. No hydronephrosis or other acute findings. 2. Mild bilateral renal atrophy. Electronically Signed   By: Vinnie Langton M.D.   On: 01/10/2019 05:03     Medical Consultants:    None.  Anti-Infectives:   IV Rocephin and azithro  Subjective:    Anne Macias relates her breathing is not at baseline.  Objective:    Vitals:   01/11/19 0500 01/11/19 0600 01/11/19 0630 01/11/19 0801  BP:   127/82 (!) 125/91  Pulse: 86 81 64 76  Resp: (!) 32   20  Temp:   98.6 F (37 C) 97.6 F (36.4 C)  TempSrc:   Oral Oral  SpO2: (!) 81% (!) 88% 94% 96%  Weight:      Height:        Intake/Output Summary (Last 24 hours) at 01/11/2019 1035 Last data filed at 01/11/2019 0215 Gross per 24 hour  Intake 240 ml  Output 350 ml  Net -110 ml   Filed Weights   01/18/2019 2115 01/10/19 0601  Weight: 61.2 kg 74.2 kg    Exam: General  exam: In no acute distress. Respiratory system: Air movement with crackles at bases Cardiovascular system: S1 & S2 heard, RRR.  Appreciate any JVD. Gastrointestinal system: Abdomen is nondistended, soft and nontender.  Central nervous system: Alert and oriented. No focal neurological deficits. Extremities: 3+ edema Skin: No rashes, lesions or ulcers Psychiatry: Judgement and insight appear normal. Mood & affect appropriate.    Data Reviewed:    Labs: Basic Metabolic Panel: Recent Labs  Lab 01/19/2019 2351 01/10/19 0252 01/11/19 0618  NA 139 139 137  K 4.0 4.1 4.0  CL 102 106 105  CO2 25 21* 22  GLUCOSE 117* 101* 112*  BUN 46* 45* 43*  CREATININE 1.96* 1.81* 1.68*    CALCIUM 8.4* 8.4* 7.8*  MG 2.3  --   --    GFR Estimated Creatinine Clearance: 22 mL/min (A) (by C-G formula based on SCr of 1.68 mg/dL (H)). Liver Function Tests: Recent Labs  Lab 01/10/2019 2351  AST 20  ALT 22  ALKPHOS 175*  BILITOT 1.1  PROT 6.3*  ALBUMIN 2.3*   No results for input(s): LIPASE, AMYLASE in the last 168 hours. No results for input(s): AMMONIA in the last 168 hours. Coagulation profile Recent Labs  Lab 01/13/2019 2351 01/10/19 0339 01/11/19 0618  INR 2.67 2.59 2.94    CBC: Recent Labs  Lab 01/11/2019 2351  WBC 8.2  NEUTROABS 5.5  HGB 11.7*  HCT 40.9  MCV 81.3  PLT 233   Cardiac Enzymes: Recent Labs  Lab 01/10/2019 2351  TROPONINI <0.03   BNP (last 3 results) No results for input(s): PROBNP in the last 8760 hours. CBG: No results for input(s): GLUCAP in the last 168 hours. D-Dimer: No results for input(s): DDIMER in the last 72 hours. Hgb A1c: No results for input(s): HGBA1C in the last 72 hours. Lipid Profile: No results for input(s): CHOL, HDL, LDLCALC, TRIG, CHOLHDL, LDLDIRECT in the last 72 hours. Thyroid function studies: Recent Labs    01/23/2019 2351  TSH 6.975*   Anemia work up: No results for input(s): VITAMINB12, FOLATE, FERRITIN, TIBC, IRON, RETICCTPCT in the last 72 hours. Sepsis Labs: Recent Labs  Lab 01/13/2019 2351  WBC 8.2   Microbiology Recent Results (from the past 240 hour(s))  Urine culture     Status: Abnormal (Preliminary result)   Collection Time: 01/08/2019 11:51 PM  Result Value Ref Range Status   Specimen Description URINE, CLEAN CATCH  Final   Special Requests   Final    NONE Performed at [facility], [place] [address], [place], [place] [address]    Culture >=100,000 COLONIES/mL GRAM NEGATIVE RODS (A)  Final   Report Status PENDING  Incomplete  Blood culture (routine x 2)     Status: None (Preliminary result)   Collection Time: 01/10/19  1:30 AM  Result Value Ref Range Status   Specimen Description  BLOOD RIGHT ANTECUBITAL  Final   Special Requests   Final    BOTTLES DRAWN AEROBIC AND ANAEROBIC Blood Culture adequate volume   Culture   Final    NO GROWTH 1 DAY Performed at [facility], [place] [address], [place],  [address]    Report Status PENDING  Incomplete  Blood culture (routine x 2)     Status: None (Preliminary result)   Collection Time: 01/10/19  1:34 AM  Result Value Ref Range Status   Specimen Description BLOOD RIGHT FOREARM  Final   Special Requests   Final    BOTTLES DRAWN AEROBIC  AND ANAEROBIC Blood Culture results may not be optimal due to an inadequate volume of blood received in culture bottles   Culture   Final    NO GROWTH 1 DAY Performed at [facility] [address] [place], [place] [address]    Report Status PENDING  Incomplete     Medications:   . amiodarone  200 mg Oral BID  . levothyroxine  25 mcg Oral Q0600  . metoprolol succinate  100 mg Oral BID  . warfarin  1 mg Oral Q M,W,F-1800  . warfarin  2 mg Oral Q T,Th,S,Su-1800  . Warfarin - Pharmacist Dosing Inpatient   Does not apply q1800   Continuous Infusions: . azithromycin    . cefTRIAXone (ROCEPHIN)  IV       LOS: 1 day   Charlynne Cousins  [facility]  01/11/2019, 10:35 AM

## 2019-01-11 NOTE — NC FL2 (Signed)
[place] LEVEL OF CARE SCREENING TOOL     IDENTIFICATION  Patient Name: Anne Macias Birthdate: May 24, 1923 Sex: female Admission Date (Current Location): 01/22/2019  [facility] [place] Number:  Herbalist and Address:  [facility], [place] [address] [place], [place] [address]      Provider Number: [id number]  Attending Physician Name and Address:  Charlynne Cousins, MD  Relative Name and Phone Number:       Current Level of Care: Hospital Recommended Level of Care: [place] Prior Approval Number:    Date Approved/Denied:   PASRR Number: [id number]  Discharge Plan: SNF    Current Diagnoses: Patient Active Problem List   Diagnosis Date Noted  . Generalized weakness 01/10/2019  . CAP (community acquired pneumonia) 01/10/2019  . Pleural effusion 01/10/2019  . AKI (acute kidney injury) ([place]) 01/10/2019  . CKD (chronic kidney disease) stage 3, GFR 30-59 ml/min (HCC) 01/10/2019  . Acute kidney injury superimposed on CKD ([place]) 01/10/2019  . Hypothyroidism   . Chronic midline low back pain without sciatica 11/05/2016  . Spinal stenosis of lumbar region with neurogenic claudication 11/05/2016  . Spondylolisthesis, lumbar region 11/05/2016  . Pyelonephritis 07/14/2016  . Closed compression fracture of L2 lumbar vertebra, with delayed healing, subsequent encounter 07/14/2016  . UTI (urinary tract infection) 07/14/2016  . Fall 06/20/2014  . Anticoagulated on Coumadin 06/20/2014  . Hyperlipidemia 06/20/2014  . Acute blood loss anemia 06/20/2014  . Pelvic fracture ([place]) 06/19/2014  . Tremor 05/08/2014  . Renal insufficiency 03/12/2014  . Anorexia 03/12/2014  . Sinus node dysfunction ([place]) 02/17/2012  . Essential hypertension, benign 12/09/2010  . Atrial fibrillation, persistent 12/09/2010  . PACEMAKER, PERMANENT-[employer] REVO 12/08/2010    Orientation RESPIRATION BLADDER Height & Weight     Self,  Time, Place  Normal Incontinent, External catheter Weight: 163 lb 9.3 oz (74.2 kg) Height:  5\' 7"  (170.2 cm)  BEHAVIORAL SYMPTOMS/MOOD NEUROLOGICAL BOWEL NUTRITION STATUS      Continent Diet(see DC summary)  AMBULATORY STATUS COMMUNICATION OF NEEDS Skin   Extensive Assist Verbally Normal                       Personal Care Assistance Level of Assistance  Bathing, Feeding, Dressing Bathing Assistance: Limited assistance Feeding assistance: Independent Dressing Assistance: Limited assistance     Functional Limitations Info  Sight, Hearing, Speech Sight Info: Adequate Hearing Info: Adequate Speech Info: Adequate    SPECIAL CARE FACTORS FREQUENCY  PT (By licensed PT), OT (By licensed OT)     PT Frequency: 5x/wk OT Frequency: 5x/wk            Contractures Contractures Info: Not present    Additional Factors Info  Code Status, Allergies Code Status Info: Partial Allergies Info: NKA           Current Medications (01/11/2019):  This is the current hospital active medication list Current Facility-Administered Medications  Medication Dose Route Frequency Provider Last Rate Last Dose  . acetaminophen (TYLENOL) tablet 650 mg  650 mg Oral Q6H PRN Shela Leff, MD       Or  . acetaminophen (TYLENOL) suppository 650 mg  650 mg Rectal Q6H PRN Shela Leff, MD      . amiodarone (PACERONE) tablet 200 mg  200 mg Oral BID Buford Dresser, MD   200 mg at 01/11/19 0831  . azithromycin (ZITHROMAX) 500 mg in sodium chloride 0.9 % 250 mL IVPB  500 mg Intravenous Q24H Shela Leff, MD      . cefTRIAXone (ROCEPHIN) 1 g in sodium chloride 0.9 % 100 mL IVPB  1 g Intravenous Q24H Shela Leff, MD      . levothyroxine (SYNTHROID, LEVOTHROID) tablet 25 mcg  25 mcg Oral Q0600 Radene Gunning, NP   25 mcg at 01/10/19 1054  . metoprolol succinate (TOPROL-XL) 24 hr tablet 100 mg  100 mg Oral BID Buford Dresser, MD   100 mg at 01/11/19 0831  . warfarin  (COUMADIN) tablet 1 mg  1 mg Oral Q M,W,F-1800 Bryk, Veronda P, RPH      . warfarin (COUMADIN) tablet 2 mg  2 mg Oral Q T,Th,S,Su-1800 Laren Everts, RPH   2 mg at 01/10/19 1812  . Warfarin - Pharmacist Dosing Inpatient   Does not apply q1800 Laren Everts, [facility]         Discharge Medications: Please see discharge summary for a list of discharge medications.  Relevant Imaging Results:  Relevant Lab Results:   Additional Information SS#: [id number]  Geralynn Ochs, LCSW

## 2019-01-12 DIAGNOSIS — J181 Lobar pneumonia, unspecified organism: Secondary | ICD-10-CM

## 2019-01-12 LAB — BASIC METABOLIC PANEL
Anion gap: 11 (ref 5–15)
BUN: 42 mg/dL — ABNORMAL HIGH (ref 8–23)
CO2: 25 mmol/L (ref 22–32)
Calcium: 7.7 mg/dL — ABNORMAL LOW (ref 8.9–10.3)
Chloride: 104 mmol/L (ref 98–111)
Creatinine, Ser: 1.53 mg/dL — ABNORMAL HIGH (ref 0.44–1.00)
GFR calc Af Amer: 34 mL/min — ABNORMAL LOW (ref >60)
GFR calc non Af Amer: 29 mL/min — ABNORMAL LOW (ref >60)
GLUCOSE: 103 mg/dL — AB (ref 70–99)
Potassium: 3.7 mmol/L (ref 3.5–5.1)
Sodium: 140 mmol/L (ref 135–145)

## 2019-01-12 LAB — URINE CULTURE: Culture: >=100000 — AB

## 2019-01-12 LAB — PROTIME-INR
INR: 2.81
Prothrombin Time: 29.1 seconds — ABNORMAL HIGH (ref 11.4–15.2)

## 2019-01-12 NOTE — Progress Notes (Signed)
Progress Note  Patient Name: Anne Macias Date of Encounter: 01/12/2019  Primary Cardiologist: Dr. Caryl Comes  Subjective   Sleeping this AM. Family present and updated at bedside. They have many questions re: plan for antibiotics and rehab placement. Patient reports feeling somewhat better.  Inpatient Medications    Scheduled Meds: . amiodarone  200 mg Oral BID  . furosemide  40 mg Intravenous BID  . levothyroxine  25 mcg Oral Q0600  . metoprolol succinate  100 mg Oral BID  . warfarin  1 mg Oral Q M,W,F-1800  . warfarin  2 mg Oral Q T,Th,S,Su-1800  . Warfarin - Pharmacist Dosing Inpatient   Does not apply q1800   Continuous Infusions: . azithromycin 500 mg (01/11/19 2300)  . cefTRIAXone (ROCEPHIN)  IV 1 g (01/11/19 2221)   PRN Meds: acetaminophen **OR** acetaminophen   Vital Signs    Vitals:   01/11/19 1952 01/11/19 2330 01/12/19 0335 01/12/19 0954  BP: 113/75 119/73 113/69 118/74  Pulse: 89 76 85 88  Resp: 18 17 18    Temp: 98 F (36.7 C) 98.3 F (36.8 C) 98.1 F (36.7 C)   TempSrc: Oral Oral Oral   SpO2: 97% 98% 98%   Weight:   70.1 kg   Height:        Intake/Output Summary (Last 24 hours) at 01/12/2019 1038 Last data filed at 01/11/2019 2332 Gross per 24 hour  Intake 240 ml  Output 800 ml  Net -560 ml   Last 3 Weights 01/12/2019 01/10/2019 01/15/2019  Weight (lbs) 154 lb 8.7 oz 163 lb 9.3 oz 135 lb  Weight (kg) 70.1 kg 74.2 kg 61.236 kg      Telemetry    Atrial fibrillation - Personally Reviewed  ECG    No new since 2/10 - Personally Reviewed  Physical Exam   GEN: No acute distress.   Neck: No JVD appreciated Cardiac: irregularly irregular S1 and S2, no murmurs, rubs, or gallops.  Respiratory: Clear in upper fields, but diminished breath sounds in bilateral bases (1/2 to 1/3 of the way up) GI: Soft, nontender, non-distended  MS: 1-2+ bilateral LE edema; No deformity. Neuro:  Nonfocal  Psych: Normal affect   Labs    Chemistry Recent Labs    Lab 01/12/2019 2351 01/10/19 0252 01/11/19 0618 01/12/19 0619  NA 139 139 137 140  K 4.0 4.1 4.0 3.7  CL 102 106 105 104  CO2 25 21* 22 25  GLUCOSE 117* 101* 112* 103*  BUN 46* 45* 43* 42*  CREATININE 1.96* 1.81* 1.68* 1.53*  CALCIUM 8.4* 8.4* 7.8* 7.7*  PROT 6.3*  --   --   --   ALBUMIN 2.3*  --   --   --   AST 20  --   --   --   ALT 22  --   --   --   ALKPHOS 175*  --   --   --   BILITOT 1.1  --   --   --   GFRNONAA 22* 24* 26* 29*  GFRAA 25* 27* 30* 34*  ANIONGAP 12 12 10 11      Hematology Recent Labs  Lab 01/14/2019 2351  WBC 8.2  RBC 5.03  HGB 11.7*  HCT 40.9  MCV 81.3  MCH 23.3*  MCHC 28.6*  RDW 16.4*  PLT 233    Cardiac Enzymes Recent Labs  Lab 01/19/2019 2351  TROPONINI <0.03   No results for input(s): TROPIPOC in the last 168 hours.  BNP Recent Labs  Lab 01/15/2019 2351  BNP 982.2*     DDimer No results for input(s): DDIMER in the last 168 hours.   Radiology    No results found.  Cardiac Studies   Echo 01/10/19  1. The left ventricle has normal systolic function of 85-46%. The cavity size was normal. Left ventricular diastology could not be evaluated secondary to atrial fibrillation.  2. The right ventricle has moderately reduced systolic function. The cavity was normal. There is no increase in right ventricular wall thickness. Right ventricular systolic pressure is moderately elevated with an estimated pressure of 51.4 mmHg.  3. Left atrial size was moderately dilated.  4. Right atrial size was mildly dilated.  5. The mitral valve is normal in structure. There is mild thickening. There is mild to moderate mitral annular calcification present.  6. The tricuspid valve is normal in structure. Tricuspid valve regurgitation is moderate-severe.  7. The aortic valve is tricuspid There is mild thickening and mild calcification of the aortic valve. Aortic valve regurgitation is trivial by color flow Doppler.  8. The aortic root and ascending aorta are  normal in size and structure.  9. The inferior vena cava was dilated in size with <50% respiratory variability.  Patient Profile     85 y.o. female with a hx of paroxysmal afib on amiodarone and coumadin, tachy-brady syndrome with permanent pacemaker, HTN, and hyperlipidemia who is being followed for atrial fibrillation.  Assessment & Plan    1. Paroxysmal atrial fibrillation -remains in afib, on amiodarone 200 mg BID and metoprolol succinate 100 mg BID.  -continue coumadin for anticoagulation -given her acute illness, I recommend monitoring INR inpatient, and then if she goes to rehab facility, documenting INRs once a week. With three weeks of therapeutic INR, we can schedule outpatient cardioversion. This avoids the need for TEE.  2. Acute on chronic diastolic heart failure Appears volume overloaded, bilateral pleural effusions. Creatitine improving with diuresis, consistent with congestion. - echo done yesterday, as above - continue diuresis, given size of effusions would use 40 mg IV furosemide BID - ins/outs difficult, follow daily weights. Today weight is down to 70.1 kg from 74.2 kg on admission.  3. Tachycardia-bradycardia syndrome S/p PPM implanted in 2011.   3. CAP & UTI Managed by hospitalists with ceftriaxone and azithromycin. As noted yesterday, would be cautious of azithromycin use with amiodarone and risk of QTc prolongation.   4. AKI Cr improving with diuresis, supports congestion. Cr 1.53 today, was 1.96 on presentation.  TIME SPENT WITH PATIENT: 15 minutes of direct patient care. More than 50% of that time was spent on coordination of care and counseling regarding atrial fibrillation and heart failure.  Buford Dresser, MD, PhD [facility]   For questions or updates, please contact [place] Please consult [email or web address] for contact info under     Signed, Buford Dresser, MD  01/12/2019, 10:38 AM

## 2019-01-12 NOTE — Progress Notes (Addendum)
Physical Therapy Treatment Patient Details Name: Anne Macias MRN: [id number] DOB: 12-Aug-1923 Today's Date: 01/12/2019    History of Present Illness Anne Macias is a 85 y.o. female with medical history significant of paroxysmal atrial fibrillation on amiodarone and Coumadin, tachy brady syndrome, status post PPM, hypertension, hyperlipidemia, and back pain presenting to the hospital for evaluation of generalized weakness and a fall at home. Chest CT showed B pleural effusions with compressive atelectasis R>L    PT Comments    Pt performed gait training and functional mobility she continues to require +1 external assistance for mobility.  Plan next session for continued progression of functional mobility to tolerance.  She continues to fatigue quickly and would benefit from skilled rehab to improve strength and function before returning home.    Follow Up Recommendations  SNF;Supervision/Assistance - 24 hour     Equipment Recommendations  None recommended by PT    Recommendations for Other Services OT consult     Precautions / Restrictions Precautions Precautions: Fall Restrictions Weight Bearing Restrictions: No    Mobility  Bed Mobility Overal bed mobility: Needs Assistance Bed Mobility: Supine to Sit     Supine to sit: Mod assist     General bed mobility comments: Pt required assistance to advance LEs to edge of bed and to elevate trunk into sitting.  Pt presents with posterior pelvic tilt.    Transfers Overall transfer level: Needs assistance Equipment used: Rolling walker (2 wheeled) Transfers: Sit to/from Stand Sit to Stand: Mod assist;+2 safety/equipment         General transfer comment: Cues for hand placement to push from seated surface.  Pt appears to be less fearful during session this pm. She has difficulty following commands for hand placement and continues to reach for device to achieve standing.  Moderate assistance to boost into standing.       Ambulation/Gait Ambulation/Gait assistance: Min assist;+2 safety/equipment(for close chair follow) Gait Distance (Feet): 20 Feet(+ 15 ft trial.  ) Assistive device: Rolling walker (2 wheeled) Gait Pattern/deviations: Step-through pattern;Trunk flexed;Shuffle Gait velocity: decreased   General Gait Details: B knee flexion with ambulation, continues to fatigue quickly.  pt required cues for RW safety and upper trunk control.     Stairs             Wheelchair Mobility    Modified Rankin (Stroke Patients Only)       Balance Overall balance assessment: Needs assistance Sitting-balance support: Bilateral upper extremity supported;Feet supported Sitting balance-Leahy Scale: Fair       Standing balance-Leahy Scale: Poor                              Cognition Arousal/Alertness: Awake/alert Behavior During Therapy: Anxious Overall Cognitive Status: Within Functional Limits for tasks assessed                                        Exercises      General Comments        Pertinent Vitals/Pain Pain Assessment: No/denies pain    Home Living                      Prior Function            PT Goals (current goals can now be found in the care plan section) Acute Rehab  PT Goals Patient Stated Goal: get stronger Potential to Achieve Goals: Fair Progress towards PT goals: Progressing toward goals    Frequency    Min 2X/week      PT Plan Current plan remains appropriate    Co-evaluation              AM-PAC PT "6 Clicks" Mobility   Outcome Measure  Help needed turning from your back to your side while in a flat bed without using bedrails?: A Little Help needed moving from lying on your back to sitting on the side of a flat bed without using bedrails?: A Little Help needed moving to and from a bed to a chair (including a wheelchair)?: A Lot Help needed standing up from a chair using your arms (e.g., wheelchair or  bedside chair)?: A Lot Help needed to walk in hospital room?: A Lot Help needed climbing 3-5 steps with a railing? : Total 6 Click Score: 13    End of Session Equipment Utilized During Treatment: Gait belt Activity Tolerance: Patient limited by fatigue Patient left: in chair;with call bell/phone within reach;with family/visitor present Nurse Communication: Mobility status PT Visit Diagnosis: Unsteadiness on feet (R26.81);Muscle weakness (generalized) (M62.81);History of falling (Z91.81);Difficulty in walking, not elsewhere classified (R26.2);Pain Pain - part of body: (back)     Time: 4268-3419 PT Time Calculation (min) (ACUTE ONLY): 25 min  Charges:  $Gait Training: 8-22 mins $Therapeutic Activity: 8-22 mins                     Governor Rooks, PTA Acute Rehabilitation Services Pager [phone] Office [phone]     Anne Macias 01/12/2019, 5:28 PM

## 2019-01-12 NOTE — Consult Note (Signed)
[facility] CM Primary Care Navigator  01/12/2019  Anne Macias [birth year] [id number]   Met with patient, husband Anne Macias") and daughter (Anne Macias)at the bedside to identify possible discharge needs.  Patientreports that she had a "fall at home", presented to the hospital for evaluation of weakness and fall which had led to this admission. (fall due to suspected  possible community-acquired pneumonia, acute diastolic heart failure, acute respiratory failure with hypoxia)   Patient endorsesDr.John Laurann Montana with [facility] at [facility].   [facility] [address] [facility].  Patientstatesthat husbandhas been managingmedications for her at home using "pill box" system filled every week.  Patientmentioned thathusband has been driving andproviding Product manager' appointments.  Patient's husband has been her primary caregiver at home. Her son Anne Macias- Psychiatric nurse) and daughter (lives in [place]) have been providing assistance with her care needs as well.  Anticipated discharge planis skilled nursing facility (SNF- in process) per therapy recommendation.Husband is unable to physically assist patient due to his injury.    Patientand family expressedunderstanding to call primary care provider's office when she gets back homefor a post discharge follow-upwithin1- 2 weeksor sooner if needs arise. Patient letter (with PCP's contact number) was provided as their reminder.  Explained to patient and familyabout [facility] CM services available for health managementand resources at home and have expressed interest about. Patientand family were encouraged to discuss with primary care provideronnext visit, for further needsand assistancein managing her health conditionsonceshe is discharge to home.  Patientand family voicedunderstanding to  seekreferral to [facility] care managementfrom primary care provider if deemed necessary and appropriateforanyservicesin the nearfuture.   [facility] care management information provided for future needs that patient may have.  Primary care provider's office is listed as providing transition of care (TOC) follow-up.   For additional questions please contact:  Anne Macias, BSN, RN-BC [facility] PRIMARY CARE Navigator Cell: [phone]

## 2019-01-12 NOTE — Progress Notes (Signed)
[facility] PROGRESS NOTE    Progress Note  Anne Macias  ONG:[id number] DOB: 02/15/1923 DOA: 01/26/2019 PCP: Lavone Orn, MD     Brief Narrative:   Anne Macias is an 85 y.o. female past medical history of paroxysmal atrial fibrillation on amiodarone and Coumadin, tachybradycardia syndrome status post pacemaker presents to the hospital for evaluation of weakness and fall at home as per her husband her dose of amiodarone was increased about a week ago  Assessment/Plan:   Fall due to suspected  possible community-acquired pneumonia: UA showed many bacteria nitrates negative.  Urine cultures were sent and are pending. CT of the chest showed groundglass opacity in both upper lung left greater than right. Continue IV empiric Rocephin and azithromycin, continues to remain afebrile. Culture data has remained negative. Consult Physical therapy.  Acute diastolic heart failure: Appreciate cardiology's assistance continue diuresis per cardiology monitor strict I's and O's Daily weights. On admission 74.2 kg.  Going back through her chart it seems like her estimated dry weight is below 70 kg.  Acute respiratory failure with hypoxia due to community-acquired pneumonia and heart failure: We will try to wean off oxygen.  ACute kidney injury on chronic kidney disease stage III: With a baseline creatinine around 1. Creatinine is slowly improving with IV diuresis.  Continue check Bumex daily.  Paroxysmal atrial fibrillation: CHA2DS2VASc 4.,  INR therapeutic continue Coumadin per pharmacy.   Hypothyroidism: Check TSH and T4 in 6 weeks once those have resolved.    DVT prophylaxis: lovenxo Family Communication:none Disposition Plan/Barrier to D/C: home 2-3 days Code Status:     Code Status Orders  (From admission, onward)         Start     Ordered   01/10/19 1052  Limited resuscitation (code)  Continuous    Question Answer Comment  In the event of cardiac or  respiratory ARREST: Initiate Code Blue, Call Rapid Response Yes   In the event of cardiac or respiratory ARREST: Perform CPR No   In the event of cardiac or respiratory ARREST: Perform Intubation/Mechanical Ventilation No   In the event of cardiac or respiratory ARREST: Use NIPPV/BiPAp only if indicated Yes   In the event of cardiac or respiratory ARREST: Administer ACLS medications if indicated Yes   In the event of cardiac or respiratory ARREST: Perform Defibrillation or Cardioversion if indicated Yes      01/10/19 1051        Code Status History    Date Active Date Inactive Code Status Order ID Comments User Context   01/10/2019 0250 01/10/2019 1051 Full Code [id number]  Shela Leff, MD ED   07/14/2016 0839 07/16/2016 1520 Full Code [id number]  Radene Gunning, NP Inpatient    Advance Directive Documentation     Most Recent Value  Type of Advance Directive  Out of facility DNR (pink MOST or yellow form)  Pre-existing out of facility DNR order (yellow form or pink MOST form)  -- [current partial code]  "MOST" Form in Place?  -        IV Access:    Peripheral IV   Procedures and diagnostic studies:   No results found.   Medical Consultants:    None.  Anti-Infectives:   IV Rocephin and azithro  Subjective:    Anne Macias she relates her breathing is improved.  Objective:    Vitals:   01/11/19 1952 01/11/19 2330 01/12/19 0335 01/12/19 0954  BP: 113/75 119/73 113/69 118/74  Pulse: 89 76  85 88  Resp: 18 17 18    Temp: 98 F (36.7 C) 98.3 F (36.8 C) 98.1 F (36.7 C)   TempSrc: Oral Oral Oral   SpO2: 97% 98% 98%   Weight:   70.1 kg   Height:        Intake/Output Summary (Last 24 hours) at 01/12/2019 1140 Last data filed at 01/11/2019 2332 Gross per 24 hour  Intake 240 ml  Output 800 ml  Net -560 ml   Filed Weights   01/03/2019 2115 01/10/19 0601 01/12/19 0335  Weight: 61.2 kg 74.2 kg 70.1 kg    Exam: General exam: In no acute  distress. Respiratory system: Air movement with crackles at bases Cardiovascular system: S1 & S2 heard, RRR.   Gastrointestinal system: Abdomen is nondistended, soft and nontender.  Central nervous system: Alert and oriented. No focal neurological deficits. Extremities: 3+ edema Skin: No rashes, lesions or ulcers Psychiatry: Judgement and insight appear normal. Mood & affect appropriate.    Data Reviewed:    Labs: Basic Metabolic Panel: Recent Labs  Lab 01/16/2019 2351 01/10/19 0252 01/11/19 0618 01/12/19 0619  NA 139 139 137 140  K 4.0 4.1 4.0 3.7  CL 102 106 105 104  CO2 25 21* 22 25  GLUCOSE 117* 101* 112* 103*  BUN 46* 45* 43* 42*  CREATININE 1.96* 1.81* 1.68* 1.53*  CALCIUM 8.4* 8.4* 7.8* 7.7*  MG 2.3  --   --   --    GFR Estimated Creatinine Clearance: 22.3 mL/min (A) (by C-G formula based on SCr of 1.53 mg/dL (H)). Liver Function Tests: Recent Labs  Lab 01/27/2019 2351  AST 20  ALT 22  ALKPHOS 175*  BILITOT 1.1  PROT 6.3*  ALBUMIN 2.3*   No results for input(s): LIPASE, AMYLASE in the last 168 hours. No results for input(s): AMMONIA in the last 168 hours. Coagulation profile Recent Labs  Lab 01/21/2019 2351 01/10/19 0339 01/11/19 0618 01/12/19 0619  INR 2.67 2.59 2.94 2.81    CBC: Recent Labs  Lab 01/11/2019 2351  WBC 8.2  NEUTROABS 5.5  HGB 11.7*  HCT 40.9  MCV 81.3  PLT 233   Cardiac Enzymes: Recent Labs  Lab 12/31/2018 2351  TROPONINI <0.03   BNP (last 3 results) No results for input(s): PROBNP in the last 8760 hours. CBG: No results for input(s): GLUCAP in the last 168 hours. D-Dimer: No results for input(s): DDIMER in the last 72 hours. Hgb A1c: No results for input(s): HGBA1C in the last 72 hours. Lipid Profile: No results for input(s): CHOL, HDL, LDLCALC, TRIG, CHOLHDL, LDLDIRECT in the last 72 hours. Thyroid function studies: Recent Labs    12/31/2018 2351  TSH 6.975*   Anemia work up: No results for input(s): VITAMINB12,  FOLATE, FERRITIN, TIBC, IRON, RETICCTPCT in the last 72 hours. Sepsis Labs: Recent Labs  Lab 01/15/2019 2351  WBC 8.2   Microbiology Recent Results (from the past 240 hour(s))  Urine culture     Status: Abnormal   Collection Time: 01/08/2019 11:51 PM  Result Value Ref Range Status   Specimen Description URINE, CLEAN CATCH  Final   Special Requests   Final    NONE Performed at [facility], [place] [address] [place], [place] [address]    Culture >=100,000 COLONIES/mL ESCHERICHIA COLI (A)  Final   Report Status 01/12/2019 FINAL  Final   Organism ID, Bacteria ESCHERICHIA COLI (A)  Final      Susceptibility   Escherichia coli - MIC*  AMPICILLIN <=2 SENSITIVE Sensitive     CEFAZOLIN <=4 SENSITIVE Sensitive     CEFTRIAXONE <=1 SENSITIVE Sensitive     CIPROFLOXACIN <=0.25 SENSITIVE Sensitive     GENTAMICIN <=1 SENSITIVE Sensitive     IMIPENEM <=0.25 SENSITIVE Sensitive     NITROFURANTOIN <=16 SENSITIVE Sensitive     TRIMETH/SULFA <=20 SENSITIVE Sensitive     AMPICILLIN/SULBACTAM <=2 SENSITIVE Sensitive     PIP/TAZO <=4 SENSITIVE Sensitive     Extended ESBL NEGATIVE Sensitive     * >=100,000 COLONIES/mL ESCHERICHIA COLI  Blood culture (routine x 2)     Status: None (Preliminary result)   Collection Time: 01/10/19  1:30 AM  Result Value Ref Range Status   Specimen Description BLOOD RIGHT ANTECUBITAL  Final   Special Requests   Final    BOTTLES DRAWN AEROBIC AND ANAEROBIC Blood Culture adequate volume   Culture   Final    NO GROWTH 2 DAYS Performed at [facility], [place] [address], [place], [place] [address]    Report Status PENDING  Incomplete  Blood culture (routine x 2)     Status: None (Preliminary result)   Collection Time: 01/10/19  1:34 AM  Result Value Ref Range Status   Specimen Description BLOOD RIGHT FOREARM  Final   Special Requests   Final    BOTTLES DRAWN AEROBIC AND ANAEROBIC Blood Culture results may not be optimal due to an inadequate volume of blood  received in culture bottles   Culture   Final    NO GROWTH 2 DAYS Performed at [facility], [place] [address] [place], [place] [address]    Report Status PENDING  Incomplete     Medications:   . amiodarone  200 mg Oral BID  . furosemide  40 mg Intravenous BID  . levothyroxine  25 mcg Oral Q0600  . metoprolol succinate  100 mg Oral BID  . warfarin  1 mg Oral Q M,W,F-1800  . warfarin  2 mg Oral Q T,Th,S,Su-1800  . Warfarin - Pharmacist Dosing Inpatient   Does not apply q1800   Continuous Infusions: . azithromycin 500 mg (01/11/19 2300)  . cefTRIAXone (ROCEPHIN)  IV 1 g (01/11/19 2221)     LOS: 2 days   Charlynne Cousins  [facility]  01/12/2019, 11:40 AM

## 2019-01-12 NOTE — Progress Notes (Signed)
ANTICOAGULATION CONSULT NOTE - Follow Up Consult  Pharmacy Consult for Coumadin Indication: atrial fibrillation  No Known Allergies  Patient Measurements: Height: 5\' 7"  (170.2 cm) Weight: 154 lb 8.7 oz (70.1 kg) IBW/kg (Calculated) : 61.6  Vital Signs: Temp: 98.1 F (36.7 C) (02/13 0335) Temp Source: Oral (02/13 0335) BP: 118/74 (02/13 0954) Pulse Rate: 88 (02/13 0954)  Labs: Recent Labs    01/24/2019 2351 01/10/19 0252 01/10/19 0339 01/11/19 0618 01/12/19 0619  HGB 11.7*  --   --   --   --   HCT 40.9  --   --   --   --   PLT 233  --   --   --   --   LABPROT 28.0*  --  27.4* 30.2* 29.1*  INR 2.67  --  2.59 2.94 2.81  CREATININE 1.96* 1.81*  --  1.68* 1.53*  TROPONINI <0.03  --   --   --   --     Estimated Creatinine Clearance: 22.3 mL/min (A) (by C-G formula based on SCr of 1.53 mg/dL (H)).  Assessment:  42 YOF admitted for weakness w/ possible PNA/UTI, to continue Coumadin for Afib, INR on admit at goal (2.67) and last PTA dose 2/9.  Missed dose 2/10 due to in ED that night and Coumadin continued on 2/11.   PTA dosing: 2mg  TTSS and 1mg  MWF    INR remains therapeutic (2.81) on home regimen. Day # 3 Ceftriaxone and Azithromycin. Azithromycin can sometimes increase sensitivity to Coumadin. On Amiodarone as PTA.  Goal of Therapy:  INR 2-3 Monitor platelets by anticoagulation protocol: Yes   Plan:   Continue Coumadin 2 mg TTSS and 1 mg MWF.  Daily PT/INR for now.  Monitor for possible Azithromycin effect on INR.  Arty Baumgartner, [place] Pager: [phone] or phone: [phone] 01/12/2019,12:10 PM

## 2019-01-12 NOTE — Discharge Instructions (Signed)

## 2019-01-13 LAB — BASIC METABOLIC PANEL
Anion gap: 10 (ref 5–15)
BUN: 40 mg/dL — ABNORMAL HIGH (ref 8–23)
CO2: 23 mmol/L (ref 22–32)
Calcium: 7.4 mg/dL — ABNORMAL LOW (ref 8.9–10.3)
Chloride: 105 mmol/L (ref 98–111)
Creatinine, Ser: 1.53 mg/dL — ABNORMAL HIGH (ref 0.44–1.00)
GFR calc Af Amer: 34 mL/min — ABNORMAL LOW (ref >60)
GFR calc non Af Amer: 29 mL/min — ABNORMAL LOW (ref >60)
Glucose, Bld: 111 mg/dL — ABNORMAL HIGH (ref 70–99)
Potassium: 3.5 mmol/L (ref 3.5–5.1)
Sodium: 138 mmol/L (ref 135–145)

## 2019-01-13 LAB — PROTIME-INR
INR: 2.7
Prothrombin Time: 28.3 seconds — ABNORMAL HIGH (ref 11.4–15.2)

## 2019-01-13 MED ORDER — AZITHROMYCIN 500 MG PO TABS
500.0000 mg | ORAL_TABLET | Freq: Every day | ORAL | Status: DC
  Administered 2019-01-13: 500 mg via ORAL
  Filled 2019-01-13: qty 1

## 2019-01-13 MED ORDER — FUROSEMIDE 10 MG/ML IJ SOLN
40.0000 mg | Freq: Every day | INTRAMUSCULAR | Status: DC
  Administered 2019-01-14: 40 mg via INTRAVENOUS
  Filled 2019-01-13: qty 4

## 2019-01-13 NOTE — Plan of Care (Signed)
Patient stable, discussed POC with patient and spouse, agreeable with plan, denies question/concerns at this time.  

## 2019-01-13 NOTE — Progress Notes (Signed)
Progress Note  Patient Name: Anne Macias Date of Encounter: 01/13/2019  Primary Cardiologist: Dr. Caryl Comes  Subjective   Alert in bed today, husband at bedside. Tomorrow is their 85th wedding anniversary. Still with some fatigue and shortness of breath, but feels like she is improving every day.   Inpatient Medications    Scheduled Meds: . amiodarone  200 mg Oral BID  . azithromycin  500 mg Oral Daily  . furosemide  40 mg Intravenous BID  . levothyroxine  25 mcg Oral Q0600  . metoprolol succinate  100 mg Oral BID  . warfarin  1 mg Oral Q M,W,F-1800  . warfarin  2 mg Oral Q T,Th,S,Su-1800  . Warfarin - Pharmacist Dosing Inpatient   Does not apply q1800   Continuous Infusions: . cefTRIAXone (ROCEPHIN)  IV Stopped (01/12/19 2243)   PRN Meds: acetaminophen **OR** acetaminophen   Vital Signs    Vitals:   01/13/19 0414 01/13/19 0815 01/13/19 0850 01/13/19 0920  BP: 110/65 102/76 115/66 107/60  Pulse: 87  (!) 51   Resp: (!) 21     Temp: 98.6 F (37 C)     TempSrc: Oral     SpO2: 97%  (!) 81%   Weight:      Height:        Intake/Output Summary (Last 24 hours) at 01/13/2019 1323 Last data filed at 01/13/2019 0600 Gross per 24 hour  Intake 1030.42 ml  Output 600 ml  Net 430.42 ml   Last 3 Weights 01/12/2019 01/10/2019 01/16/2019  Weight (lbs) 154 lb 8.7 oz 163 lb 9.3 oz 135 lb  Weight (kg) 70.1 kg 74.2 kg 61.236 kg      Telemetry    Atrial fibrillation - Personally Reviewed  ECG    No new since 2/10 - Personally Reviewed  Physical Exam   GEN: No acute distress.   Neck: No JVD appreciated sitting upright in bed Cardiac: irregularly irregular S1 and S2, no murmurs, rubs, or gallops.  Respiratory: Still with diminished breath sounds in bilateral bases  GI: Soft, nontender, non-distended  MS: 1-2+ bilateral LE edema worse at ankles; No deformity. Neuro:  Nonfocal  Psych: Normal affect   Labs    Chemistry Recent Labs  Lab 01/20/2019 2351  01/11/19 0618  01/12/19 0619 01/13/19 0611  NA 139   < > 137 140 138  K 4.0   < > 4.0 3.7 3.5  CL 102   < > 105 104 105  CO2 25   < > 22 25 23   GLUCOSE 117*   < > 112* 103* 111*  BUN 46*   < > 43* 42* 40*  CREATININE 1.96*   < > 1.68* 1.53* 1.53*  CALCIUM 8.4*   < > 7.8* 7.7* 7.4*  PROT 6.3*  --   --   --   --   ALBUMIN 2.3*  --   --   --   --   AST 20  --   --   --   --   ALT 22  --   --   --   --   ALKPHOS 175*  --   --   --   --   BILITOT 1.1  --   --   --   --   GFRNONAA 22*   < > 26* 29* 29*  GFRAA 25*   < > 30* 34* 34*  ANIONGAP 12   < > 10 11 10    < > =  values in this interval not displayed.     Hematology Recent Labs  Lab 01/14/2019 2351  WBC 8.2  RBC 5.03  HGB 11.7*  HCT 40.9  MCV 81.3  MCH 23.3*  MCHC 28.6*  RDW 16.4*  PLT 233    Cardiac Enzymes Recent Labs  Lab 01/10/2019 2351  TROPONINI <0.03   No results for input(s): TROPIPOC in the last 168 hours.   BNP Recent Labs  Lab 01/21/2019 2351  BNP 982.2*     DDimer No results for input(s): DDIMER in the last 168 hours.   Radiology    No results found.  Cardiac Studies   Echo 01/10/19  1. The left ventricle has normal systolic function of 92-33%. The cavity size was normal. Left ventricular diastology could not be evaluated secondary to atrial fibrillation.  2. The right ventricle has moderately reduced systolic function. The cavity was normal. There is no increase in right ventricular wall thickness. Right ventricular systolic pressure is moderately elevated with an estimated pressure of 51.4 mmHg.  3. Left atrial size was moderately dilated.  4. Right atrial size was mildly dilated.  5. The mitral valve is normal in structure. There is mild thickening. There is mild to moderate mitral annular calcification present.  6. The tricuspid valve is normal in structure. Tricuspid valve regurgitation is moderate-severe.  7. The aortic valve is tricuspid There is mild thickening and mild calcification of the aortic valve.  Aortic valve regurgitation is trivial by color flow Doppler.  8. The aortic root and ascending aorta are normal in size and structure.  9. The inferior vena cava was dilated in size with <50% respiratory variability.  Patient Profile     85 y.o. female with a hx of paroxysmal afib on amiodarone and coumadin, tachy-brady syndrome with permanent pacemaker, HTN, and hyperlipidemia who is being followed for atrial fibrillation.  Assessment & Plan    1. Paroxysmal atrial fibrillation -remains in afib, on amiodarone 200 mg BID and metoprolol succinate 100 mg BID.  -continue coumadin for anticoagulation -given her acute illness, I recommend monitoring INR inpatient, and then if she goes to rehab facility, documenting INRs once a week. With three weeks of therapeutic INR, we can schedule outpatient cardioversion. This avoids the need for TEE.  2. Acute on chronic diastolic heart failure LE edema, bilateral pleural effusions - echo done 2/11, as above - difficulty will be in the third spacing of fluid. I recommend compression stockings for her legs to assist with this. The pleural effusions will likely take some time to fully resolve. Her creatinine has leveled off, and so I think much of the acute vascular congestion is improved. I would continue with a gentle diuresis and increase ambulation as tolerated to try to redistribute the fluid. Will change to 40 mg IV daily dosing today, can change to 80 mg oral daily at time of discharge to SNF. - I/O difficult, daily weights have been variable. -given that she is on anticoagulation and would like a cardioversion in several weeks, would avoid thoracentesis and manage conservatively to avoid interruption in anticoagulation.  3. Tachycardia-bradycardia syndrome S/p PPM implanted in 2011.   3. CAP & UTI Managed by hospitalists with ceftriaxone and azithromycin. As noted yesterday, would be cautious of azithromycin use with amiodarone and risk of QTc  prolongation.   4. AKI Cr improving with diuresis, supports congestion. Cr stable 1.53 today, was 1.96 on presentation. See note above re: diuresis  [facility] will sign off in anticipation  of rehab placement. Discussed at length that this will be a slow, gradual improvement given the third spacing of fluid. Patient and her husband understand this. Medication Recommendations:  amiodarine 200 mg BID, metoprolol succinate 100 mg BID, coumading, furosemide 80 mg daily Other recommendations (labs, testing, etc):  Weekly INR checks at nursing facility Follow up as an outpatient:  We will arrange outpatient follow up to discuss and plan for cardioversion  TIME SPENT WITH PATIENT: 20 minutes of direct patient care. More than 50% of that time was spent on coordination of care and counseling regarding atrial fibrillation and heart failure.  Buford Dresser, MD, PhD [facility]   For questions or updates, please contact [place] Please consult [email or web address] for contact info under     Signed, Buford Dresser, MD  01/13/2019, 1:23 PM

## 2019-01-13 NOTE — Progress Notes (Signed)
ANTICOAGULATION CONSULT NOTE - Follow Up Consult  Pharmacy Consult for Coumadin Indication: atrial fibrillation  No Known Allergies  Patient Measurements: Height: 5\' 7"  (170.2 cm) Weight: 154 lb 8.7 oz (70.1 kg) IBW/kg (Calculated) : 61.6  Vital Signs: Temp: 98.6 F (37 C) (02/14 0414) Temp Source: Oral (02/14 0414) BP: 102/76 (02/14 0815) Pulse Rate: 87 (02/14 0414)  Labs: Recent Labs    01/11/19 0618 01/12/19 0619 01/13/19 0611  LABPROT 30.2* 29.1* 28.3*  INR 2.94 2.81 2.70  CREATININE 1.68* 1.53* 1.53*    Estimated Creatinine Clearance: 22.3 mL/min (A) (by C-G formula based on SCr of 1.53 mg/dL (H)).  Assessment:  36 YOF admitted for weakness w/ possible PNA/UTI, to continue Coumadin for Afib, INR on admit at goal (2.67) and last PTA dose 2/9.  Missed dose 2/10 due to in ED that night and Coumadin continued on 2/11.   PTA dosing: 2mg  TTSS and 1mg  MWF  INR therapeutic at 2.70 on home regimen.  CBC stable.  Goal of Therapy:  INR 2-3 Monitor platelets by anticoagulation protocol: Yes   Plan:  Continue home regimen of Coumadin 2mg  TTSS and 1mg  MWF Daily INR, s/s bleeding  Bertis Ruddy, PharmD Clinical Pharmacist Please check AMION for all [place] numbers 01/13/2019 8:56 AM

## 2019-01-13 NOTE — Progress Notes (Signed)
[facility] PROGRESS NOTE    Progress Note  Anne Macias  ENM:[id number] DOB: 1922/12/07 DOA: 01/03/2019 PCP: Lavone Orn, MD     Brief Narrative:   Anne Macias is an 85 y.o. female past medical history of paroxysmal atrial fibrillation on amiodarone and Coumadin, tachybradycardia syndrome status post pacemaker presents to the hospital for evaluation of weakness and fall at home as per her husband her dose of amiodarone was increased about a week ago  Assessment/Plan:   Fall due to suspected  possible community-acquired pneumonia: UA showed many bacteria nitrates negative.  Urine cultures were sent and are pending. CT of the chest showed groundglass opacity in both upper lung left greater than right. Continue IV empiric Rocephin and azithromycin, continues to remain afebrile. Culture data has remained negative. Consult Physical therapy recommended skilled nursing facility versus 24-hour supervision.  Acute diastolic heart failure: Appreciate cardiology's assistance continue diuresis per cardiology monitor strict I's and O's Daily weights. On admission 74.2 kg.  Going back through her chart it seems like her estimated dry weight is below 70 kg. Further management per cardiology.  I will discuss with cardiology her diuresis as her creatinine has remained at 1.5 she remains on physical exam fluid overloaded and she is borderline hypotensive.  He had minimal urine output overnight, her urine is significantly concentrated.   Acute respiratory failure with hypoxia due to community-acquired pneumonia and heart failure: We will try to wean off oxygen.  ACute kidney injury on chronic kidney disease stage III: With a baseline creatinine around 1. Creatinine is slowly improving with IV diuresis.  Continue check Bumex daily.  Paroxysmal atrial fibrillation: CHA2DS2VASc 4.,  INR therapeutic continue Coumadin per pharmacy.   Hypothyroidism: Check TSH and T4 in 6 weeks once  those have resolved.    DVT prophylaxis: lovenxo Family Communication:none Disposition Plan/Barrier to D/C: home 2-3 days Code Status:     Code Status Orders  (From admission, onward)         Start     Ordered   01/10/19 1052  Limited resuscitation (code)  Continuous    Question Answer Comment  In the event of cardiac or respiratory ARREST: Initiate Code Blue, Call Rapid Response Yes   In the event of cardiac or respiratory ARREST: Perform CPR No   In the event of cardiac or respiratory ARREST: Perform Intubation/Mechanical Ventilation No   In the event of cardiac or respiratory ARREST: Use NIPPV/BiPAp only if indicated Yes   In the event of cardiac or respiratory ARREST: Administer ACLS medications if indicated Yes   In the event of cardiac or respiratory ARREST: Perform Defibrillation or Cardioversion if indicated Yes      01/10/19 1051        Code Status History    Date Active Date Inactive Code Status Order ID Comments User Context   01/10/2019 0250 01/10/2019 1051 Full Code [id number]  Shela Leff, MD ED   07/14/2016 0839 07/16/2016 1520 Full Code [id number]  Radene Gunning, NP Inpatient    Advance Directive Documentation     Most Recent Value  Type of Advance Directive  Out of facility DNR (pink MOST or yellow form)  Pre-existing out of facility DNR order (yellow form or pink MOST form)  -- [current partial code]  "MOST" Form in Place?  -        IV Access:    Peripheral IV   Procedures and diagnostic studies:   No results found.   Medical Consultants:  None.  Anti-Infectives:   IV Rocephin and azithro  Subjective:    [name] she relates her breathing is improved, but she relates tired today and sleepy.  Objective:    Vitals:   01/13/19 0414 01/13/19 0815 01/13/19 0850 01/13/19 0920  BP: 110/65 102/76 115/66 107/60  Pulse: 87  (!) 51   Resp: (!) 21     Temp: 98.6 F (37 C)     TempSrc: Oral     SpO2: 97%  (!) 81%     Weight:      Height:        Intake/Output Summary (Last 24 hours) at 01/13/2019 1128 Last data filed at 01/13/2019 0600 Gross per 24 hour  Intake 1270.42 ml  Output 600 ml  Net 670.42 ml   Filed Weights   01/07/2019 2115 01/10/19 0601 01/12/19 0335  Weight: 61.2 kg 74.2 kg 70.1 kg    Exam: General exam: In no acute distress. Respiratory system: Air movement with crackles at bases Cardiovascular system: S1 & S2 heard, RRR.   Gastrointestinal system: Abdomen is nondistended, soft and nontender.  Central nervous system: Alert and oriented. No focal neurological deficits. Extremities: 3+ edema Skin: No rashes, lesions or ulcers Psychiatry: Judgement and insight appear normal. Mood & affect appropriate.    Data Reviewed:    Labs: Basic Metabolic Panel: Recent Labs  Lab 01/12/2019 2351 01/10/19 0252 01/11/19 0618 01/12/19 0619 01/13/19 0611  NA 139 139 137 140 138  K 4.0 4.1 4.0 3.7 3.5  CL 102 106 105 104 105  CO2 25 21* 22 25 23   GLUCOSE 117* 101* 112* 103* 111*  BUN 46* 45* 43* 42* 40*  CREATININE 1.96* 1.81* 1.68* 1.53* 1.53*  CALCIUM 8.4* 8.4* 7.8* 7.7* 7.4*  MG 2.3  --   --   --   --    GFR Estimated Creatinine Clearance: 22.3 mL/min (A) (by C-G formula based on SCr of 1.53 mg/dL (H)). Liver Function Tests: Recent Labs  Lab 01/12/2019 2351  AST 20  ALT 22  ALKPHOS 175*  BILITOT 1.1  PROT 6.3*  ALBUMIN 2.3*   No results for input(s): LIPASE, AMYLASE in the last 168 hours. No results for input(s): AMMONIA in the last 168 hours. Coagulation profile Recent Labs  Lab 01/06/2019 2351 01/10/19 0339 01/11/19 0618 01/12/19 0619 01/13/19 0611  INR 2.67 2.59 2.94 2.81 2.70    CBC: Recent Labs  Lab 01/22/2019 2351  WBC 8.2  NEUTROABS 5.5  HGB 11.7*  HCT 40.9  MCV 81.3  PLT 233   Cardiac Enzymes: Recent Labs  Lab 01/02/2019 2351  TROPONINI <0.03   BNP (last 3 results) No results for input(s): PROBNP in the last 8760 hours. CBG: No results for  input(s): GLUCAP in the last 168 hours. D-Dimer: No results for input(s): DDIMER in the last 72 hours. Hgb A1c: No results for input(s): HGBA1C in the last 72 hours. Lipid Profile: No results for input(s): CHOL, HDL, LDLCALC, TRIG, CHOLHDL, LDLDIRECT in the last 72 hours. Thyroid function studies: No results for input(s): TSH, T4TOTAL, T3FREE, THYROIDAB in the last 72 hours.  Invalid input(s): FREET3 Anemia work up: No results for input(s): VITAMINB12, FOLATE, FERRITIN, TIBC, IRON, RETICCTPCT in the last 72 hours. Sepsis Labs: Recent Labs  Lab 01/04/2019 2351  WBC 8.2   Microbiology Recent Results (from the past 240 hour(s))  Urine culture     Status: Abnormal   Collection Time: 01/15/2019 11:51 PM  Result Value Ref Range Status  Specimen Description URINE, CLEAN CATCH  Final   Special Requests   Final    NONE Performed at [facility], [place] [address], [place], [place] [address]    Culture >=100,000 COLONIES/mL ESCHERICHIA COLI (A)  Final   Report Status 01/12/2019 FINAL  Final   Organism ID, Bacteria ESCHERICHIA COLI (A)  Final      Susceptibility   Escherichia coli - MIC*    AMPICILLIN <=2 SENSITIVE Sensitive     CEFAZOLIN <=4 SENSITIVE Sensitive     CEFTRIAXONE <=1 SENSITIVE Sensitive     CIPROFLOXACIN <=0.25 SENSITIVE Sensitive     GENTAMICIN <=1 SENSITIVE Sensitive     IMIPENEM <=0.25 SENSITIVE Sensitive     NITROFURANTOIN <=16 SENSITIVE Sensitive     TRIMETH/SULFA <=20 SENSITIVE Sensitive     AMPICILLIN/SULBACTAM <=2 SENSITIVE Sensitive     PIP/TAZO <=4 SENSITIVE Sensitive     Extended ESBL NEGATIVE Sensitive     * >=100,000 COLONIES/mL ESCHERICHIA COLI  Blood culture (routine x 2)     Status: None (Preliminary result)   Collection Time: 01/10/19  1:30 AM  Result Value Ref Range Status   Specimen Description BLOOD RIGHT ANTECUBITAL  Final   Special Requests   Final    BOTTLES DRAWN AEROBIC AND ANAEROBIC Blood Culture adequate volume   Culture   Final     NO GROWTH 3 DAYS Performed at [facility], [place] [address], [place], [place] [address]    Report Status PENDING  Incomplete  Blood culture (routine x 2)     Status: None (Preliminary result)   Collection Time: 01/10/19  1:34 AM  Result Value Ref Range Status   Specimen Description BLOOD RIGHT FOREARM  Final   Special Requests   Final    BOTTLES DRAWN AEROBIC AND ANAEROBIC Blood Culture results may not be optimal due to an inadequate volume of blood received in culture bottles   Culture   Final    NO GROWTH 3 DAYS Performed at [facility], [place] [address] [place], [place] [address]    Report Status PENDING  Incomplete     Medications:   . amiodarone  200 mg Oral BID  . azithromycin  500 mg Oral Daily  . furosemide  40 mg Intravenous BID  . levothyroxine  25 mcg Oral Q0600  . metoprolol succinate  100 mg Oral BID  . warfarin  1 mg Oral Q M,W,F-1800  . warfarin  2 mg Oral Q T,Th,S,Su-1800  . Warfarin - Pharmacist Dosing Inpatient   Does not apply q1800   Continuous Infusions: . cefTRIAXone (ROCEPHIN)  IV Stopped (01/12/19 2243)     LOS: 3 days   Charlynne Cousins  [facility]  01/13/2019, 11:28 AM

## 2019-01-13 NOTE — Care Management Important Message (Signed)
Important Message  Patient Details  Name: Anne Macias MRN: [id number] Date of Birth: [birth year]   Medicare Important Message Given:  Yes    Orbie Pyo 01/13/2019, 4:06 PM

## 2019-01-14 ENCOUNTER — Inpatient Hospital Stay (HOSPITAL_COMMUNITY): Payer: Medicare Other

## 2019-01-14 DIAGNOSIS — R0602 Shortness of breath: Secondary | ICD-10-CM

## 2019-01-14 DIAGNOSIS — Z515 Encounter for palliative care: Secondary | ICD-10-CM

## 2019-01-14 LAB — BASIC METABOLIC PANEL
Anion gap: 9 (ref 5–15)
BUN: 38 mg/dL — ABNORMAL HIGH (ref 8–23)
CO2: 24 mmol/L (ref 22–32)
Calcium: 7.5 mg/dL — ABNORMAL LOW (ref 8.9–10.3)
Chloride: 104 mmol/L (ref 98–111)
Creatinine, Ser: 1.57 mg/dL — ABNORMAL HIGH (ref 0.44–1.00)
GFR calc Af Amer: 33 mL/min — ABNORMAL LOW (ref >60)
GFR calc non Af Amer: 28 mL/min — ABNORMAL LOW (ref >60)
Glucose, Bld: 101 mg/dL — ABNORMAL HIGH (ref 70–99)
Potassium: 3.5 mmol/L (ref 3.5–5.1)
Sodium: 137 mmol/L (ref 135–145)

## 2019-01-14 LAB — BLOOD GAS, ARTERIAL
Acid-Base Excess: 0.7 mmol/L (ref 0.0–2.0)
Bicarbonate: 25 mmol/L (ref 20.0–28.0)
Drawn by: 105521
O2 CONTENT: 15 L/min
O2 SAT: 95.2 %
Patient temperature: 98.6
pCO2 arterial: 41.1 mmHg (ref 32.0–48.0)
pH, Arterial: 7.4 (ref 7.350–7.450)
pO2, Arterial: 97 mmHg (ref 83.0–108.0)

## 2019-01-14 LAB — PROTIME-INR
INR: 2.54
PROTHROMBIN TIME: 27 s — AB (ref 11.4–15.2)

## 2019-01-14 LAB — BRAIN NATRIURETIC PEPTIDE: B Natriuretic Peptide: 1007.5 pg/mL — ABNORMAL HIGH (ref 0.0–100.0)

## 2019-01-14 LAB — SODIUM, URINE, RANDOM: Sodium, Ur: <10 mmol/L

## 2019-01-14 LAB — MRSA PCR SCREENING: MRSA by PCR: NEGATIVE

## 2019-01-14 MED ORDER — AMOXICILLIN-POT CLAVULANATE 875-125 MG PO TABS: 1.0000 | ORAL_TABLET | Freq: Two times a day (BID) | ORAL | Status: DC

## 2019-01-14 MED ORDER — SODIUM CHLORIDE 0.9 % IV SOLN
1.0000 g | INTRAVENOUS | Status: DC
  Filled 2019-01-14: qty 10

## 2019-01-14 MED ORDER — SODIUM CHLORIDE 0.9 % IV BOLUS
500.0000 mL | Freq: Once | INTRAVENOUS | Status: AC
  Administered 2019-01-14: 500 mL via INTRAVENOUS

## 2019-01-14 MED ORDER — SODIUM CHLORIDE 0.9 % IV SOLN
1.0000 g | INTRAVENOUS | Status: DC
  Administered 2019-01-14 – 2019-01-17 (×4): 1 g via INTRAVENOUS
  Filled 2019-01-14 (×4): qty 1

## 2019-01-14 MED ORDER — VANCOMYCIN HCL 10 G IV SOLR
1250.0000 mg | INTRAVENOUS | Status: DC
  Administered 2019-01-14: 1250 mg via INTRAVENOUS
  Filled 2019-01-14 (×2): qty 1250

## 2019-01-14 MED ORDER — SODIUM CHLORIDE 0.9 % IV SOLN
500.0000 mg | INTRAVENOUS | Status: DC
  Filled 2019-01-14: qty 500

## 2019-01-14 MED ORDER — SODIUM CHLORIDE 0.9 % IV SOLN: INTRAVENOUS | Status: DC

## 2019-01-14 NOTE — Progress Notes (Signed)
Progress Note  Patient Name: Anne Macias Date of Encounter: 01/14/2019  Primary Cardiologist: Dr. Caryl Comes  Subjective   Re-consulted today at the request of Dr. Aileen Fass. Daughter, husband, and other family at bedside.Patient is on nonrebreather, but appears comfortable. They are very worried as she was very short of breath earlier in the day. Showed imaging to family, noted CT scan with large right pleural effusion. They just want her to feel better, would be open to discussing thoracentesis with IR/pulm but ideally would like to wait until patient's son Anne Macias is back in town. Reviewed lab results with them as well.  Inpatient Medications    Scheduled Meds: . amiodarone  200 mg Oral BID  . levothyroxine  25 mcg Oral Q0600  . metoprolol succinate  100 mg Oral BID  . Warfarin - Pharmacist Dosing Inpatient   Does not apply q1800   Continuous Infusions: . ceFEPime (MAXIPIME) IV 1 g (01/14/19 1325)  . vancomycin     PRN Meds: acetaminophen **OR** acetaminophen   Vital Signs    Vitals:   01/14/19 0400 01/14/19 0500 01/14/19 0800 01/14/19 1216  BP: 99/71  107/60 114/73  Pulse: 83  70 91  Resp: (!) 29  20 (!) 24  Temp: 98.9 F (37.2 C)  98 F (36.7 C) 97.7 F (36.5 C)  TempSrc: Oral  Oral Axillary  SpO2: 90%  91% 99%  Weight:  75.8 kg    Height:        Intake/Output Summary (Last 24 hours) at 01/14/2019 1340 Last data filed at 01/14/2019 0700 Gross per 24 hour  Intake 300 ml  Output -  Net 300 ml   Last 3 Weights 01/14/2019 01/12/2019 01/10/2019  Weight (lbs) 167 lb 1.7 oz 154 lb 8.7 oz 163 lb 9.3 oz  Weight (kg) 75.8 kg 70.1 kg 74.2 kg      Telemetry    Atrial fibrillation - Personally Reviewed  ECG    Atrial fib, occasionally paced - Personally Reviewed  Physical Exam   GEN: Sitting up with NRB mask on, but not tachypneic, no accessory muscle use  Neck: No JVD appreciated sitting upright in bed (has prominent V wave from TR, but does not remain  elevated above clavicle) Cardiac: irregularly irregular S1 and S2, no murmurs, rubs, or gallops.  Respiratory: continued diminished breath sounds at bases, with breath sounds on R side not present until 1/2 way up back. GI: Soft, nontender, non-distended  MS: 1+ bilateral LE edema worse at ankles; No deformity. Neuro:  Nonfocal  Psych: Normal affect   Labs    Chemistry Recent Labs  Lab 01/10/2019 2351  01/12/19 0619 01/13/19 0611 01/14/19 0605  NA 139   < > 140 138 137  K 4.0   < > 3.7 3.5 3.5  CL 102   < > 104 105 104  CO2 25   < > 25 23 24   GLUCOSE 117*   < > 103* 111* 101*  BUN 46*   < > 42* 40* 38*  CREATININE 1.96*   < > 1.53* 1.53* 1.57*  CALCIUM 8.4*   < > 7.7* 7.4* 7.5*  PROT 6.3*  --   --   --   --   ALBUMIN 2.3*  --   --   --   --   AST 20  --   --   --   --   ALT 22  --   --   --   --  ALKPHOS 175*  --   --   --   --   BILITOT 1.1  --   --   --   --   GFRNONAA 22*   < > 29* 29* 28*  GFRAA 25*   < > 34* 34* 33*  ANIONGAP 12   < > 11 10 9    < > = values in this interval not displayed.     Hematology Recent Labs  Lab 01/16/2019 2351  WBC 8.2  RBC 5.03  HGB 11.7*  HCT 40.9  MCV 81.3  MCH 23.3*  MCHC 28.6*  RDW 16.4*  PLT 233    Cardiac Enzymes Recent Labs  Lab 01/02/2019 2351  TROPONINI <0.03   No results for input(s): TROPIPOC in the last 168 hours.   BNP Recent Labs  Lab 01/03/2019 2351  BNP 982.2*     DDimer No results for input(s): DDIMER in the last 168 hours.   Radiology    Dg Chest Port 1 View  Result Date: 01/14/2019 CLINICAL DATA:  Shortness of breath. EXAM: PORTABLE CHEST 1 VIEW COMPARISON:  CT 01/10/2019.  Radiographs 01/13/2019. FINDINGS: 1042 hours. Right subclavian pacemaker leads are unchanged at the right atrium and right ventricular apex. The heart size and mediastinal contours are stable. There is aortic atherosclerosis. Moderate size bilateral pleural effusions have not significantly changed. However, there are worsening  bilateral perihilar airspace opacities and poor definition of the pulmonary vasculature. No pneumothorax. The bones appear unchanged. IMPRESSION: 1. Worsening bilateral perihilar opacities could reflect edema or atypical pneumonia. 2. Underlying bilateral pleural effusions and bibasilar atelectasis have not significantly changed from the recent prior studies. Electronically Signed   By: Richardean Sale M.D.   On: 01/14/2019 11:18    Cardiac Studies   Echo 01/10/19  1. The left ventricle has normal systolic function of 93-57%. The cavity size was normal. Left ventricular diastology could not be evaluated secondary to atrial fibrillation.  2. The right ventricle has moderately reduced systolic function. The cavity was normal. There is no increase in right ventricular wall thickness. Right ventricular systolic pressure is moderately elevated with an estimated pressure of 51.4 mmHg.  3. Left atrial size was moderately dilated.  4. Right atrial size was mildly dilated.  5. The mitral valve is normal in structure. There is mild thickening. There is mild to moderate mitral annular calcification present.  6. The tricuspid valve is normal in structure. Tricuspid valve regurgitation is moderate-severe.  7. The aortic valve is tricuspid There is mild thickening and mild calcification of the aortic valve. Aortic valve regurgitation is trivial by color flow Doppler.  8. The aortic root and ascending aorta are normal in size and structure.  9. The inferior vena cava was dilated in size with <50% respiratory variability.  Patient Profile     85 y.o. female with a hx of paroxysmal afib on amiodarone and coumadin, tachy-brady syndrome with permanent pacemaker, HTN, and hyperlipidemia who is being followed for atrial fibrillation.  Assessment & Plan    1. Paroxysmal atrial fibrillation -remains in afib, on amiodarone 200 mg BID and metoprolol succinate 100 mg BID.  -see below re: coumadin. Will need 3 weeks  (with documented weekly checks) of therapeutic INR on coumadin, but this can be moved back if INR needs reversed for procedure.  2. Acute on chronic diastolic heart failure She was doing very well yesterday, feeling well, good breathing status, but today she has somewhat decompensated. I am concerned about her pleural effusions  and compressive atelectasis. Her blood gas is fine, so there is not an emergent need for support, but she may have improvement in her atelectasis with positive pressure ventilation.   I do not this this is all diastolic heart failure. Her LE edema has markedly improved and is mostly non-pitting now, consistent more with some venous insufficiency. With how out of proportion the right pleural effusion is compared to the left, I would not automatically assume this is heart failure.   Given her change in status, I would recommend discussing with IR or pulm whether a thoracentesis is feasible. It is ok from a cardiology standpoint to hold or reverse coumadin in the short term for a procedure. The family would like to discuss this among themselves, and ideally they would like to wait until Anne Macias returns tomorrow morning. They are amenable to deciding sooner if needed. Communicated this via phone to Dr. Aileen Fass. - echo done 2/11, as above - difficulty will be in the third spacing of fluid. I think she is nearing euvolemia, and her kidney function is stable. The LE edema and pleural effusions will take time to redistribute into the vascular space. - I/O difficult, daily weights have been variable. Clinically her JVD is difficult to assess due to TR waves and afib, but she appears to have normal RA pressure between TR waves.  3. Tachycardia-bradycardia syndrome S/p PPM implanted in 2011.   3. CAP & UTI Managed by hospitalist team with ceftriaxone and azithromycin. As noted yesterday, would be cautious of azithromycin use with amiodarone and risk of QTc prolongation.   4.  AKI Cr stabilized 1.53-1.57 the last three days; in the last year has ranged from 1.06-1.47. This is not far from where she is currently. Avoid nephrotoxic agents, continue to monitor.  TIME SPENT WITH PATIENT: >35 minutes of direct patient care. More than 50% of that time was spent on coordination of care and counseling regarding pleural effusions, heart failure, atrial fibrillation.  Buford Dresser, MD, PhD [facility]   For questions or updates, please contact [place] Please consult [email or web address] for contact info under     Signed, Buford Dresser, MD  01/14/2019, 1:40 PM

## 2019-01-14 NOTE — Progress Notes (Signed)
Called to see patient for thora. She is DNI on biPAP with INR > 2 and daughter says she cannot sit for thora. Currently supine. Effusion is bilateral and small to moderate in size  Too high risk for CCM thora at bedside and will not perform   D/w Dr Jeannette Corpus    Dr. Brand Males, M.D., F.C.C.P,  Pulmonary and Critical Care Medicine Staff Physician, [place] Director - Interstitial Lung Disease  Program  Pulmonary [facility] [place], [place], [address]  Pager: [phone], If no answer or between  15:00h - 7:00h: call [phone] Telephone: [phone]  3:35 PM 01/14/2019       Code Status Orders  (From admission, onward)         Start     Ordered   01/10/19 1052  Limited resuscitation (code)  Continuous    Question Answer Comment  In the event of cardiac or respiratory ARREST: Initiate Code Blue, Call Rapid Response Yes   In the event of cardiac or respiratory ARREST: Perform CPR No   In the event of cardiac or respiratory ARREST: Perform Intubation/Mechanical Ventilation No   In the event of cardiac or respiratory ARREST: Use NIPPV/BiPAp only if indicated Yes   In the event of cardiac or respiratory ARREST: Administer ACLS medications if indicated Yes   In the event of cardiac or respiratory ARREST: Perform Defibrillation or Cardioversion if indicated Yes      01/10/19 1051        Code Status History    Date Active Date Inactive Code Status Order ID Comments User Context   01/10/2019 0250 01/10/2019 1051 Full Code [id number]  Shela Leff, MD ED   07/14/2016 0839 07/16/2016 1520 Full Code [id number]  Radene Gunning, NP Inpatient    Advance Directive Documentation     Most Recent Value  Type of Advance Directive  Out of facility DNR (pink MOST or yellow form)  Pre-existing out of facility DNR order (yellow form or pink MOST form)  -- [current partial code]  "MOST" Form in Place?   -

## 2019-01-14 NOTE — Progress Notes (Signed)
Took pt off BIPAP for a while and placed on 100% non rebreather

## 2019-01-14 NOTE — Progress Notes (Addendum)
[facility] PROGRESS NOTE    Progress Note  Anne Macias  HYW:[id number] DOB: [birth year] DOA: 01/08/2019 PCP: Lavone Orn, MD     Brief Narrative:   Anne Macias is an 85 y.o. female past medical history of paroxysmal atrial fibrillation on amiodarone and Coumadin, tachybradycardia syndrome status post pacemaker presents to the hospital for evaluation of weakness and fall at home as per her husband her dose of amiodarone was increased about a week ago  Assessment/Plan:   Fall due to suspected  possible community-acquired pneumonia: Urine culture grew more than 100,000 colonies of E. coli sensitive to ampicillin. CT of the chest showed groundglass opacity in both upper lung left greater than right. Continue Rocephin and azithromycin. Culture data has remained negative. Consult Physical therapy recommended skilled nursing facility versus 24-hour supervision.  Acute diastolic heart failure and right-sided heart failure/hypovolemic hypotension: Appreciate cardiology's assistance. Stop IV Lasix. 2D echo showed 55% with right ventricular reduced systolic function. She is extremely weak weekly give her 500cc of normal saline and continue her on 100 cc an hour  Acute respiratory failure with hypoxia due to community-acquired pneumonia and heart failure: Patient oxygen requirement has increased, lungs are clear to auscultation. She is also been quite somnolent Check a chest x-ray and an ABG.  ACute kidney injury on chronic kidney disease stage III: With a baseline creatinine around 1. Stop IV Lasix.  Paroxysmal atrial fibrillation: CHA2DS2VASc 4.,  INR therapeutic continue Coumadin per pharmacy.   Hypothyroidism: Check TSH and T4 in 6 weeks once those have resolved.    DVT prophylaxis: lovenxo Family Communication:none Disposition Plan/Barrier to D/C: home 2-3 days Code Status:     Code Status Orders  (From admission, onward)         Start     Ordered   01/10/19 1052  Limited resuscitation (code)  Continuous    Question Answer Comment  In the event of cardiac or respiratory ARREST: Initiate Code Blue, Call Rapid Response Yes   In the event of cardiac or respiratory ARREST: Perform CPR No   In the event of cardiac or respiratory ARREST: Perform Intubation/Mechanical Ventilation No   In the event of cardiac or respiratory ARREST: Use NIPPV/BiPAp only if indicated Yes   In the event of cardiac or respiratory ARREST: Administer ACLS medications if indicated Yes   In the event of cardiac or respiratory ARREST: Perform Defibrillation or Cardioversion if indicated Yes      01/10/19 1051        Code Status History    Date Active Date Inactive Code Status Order ID Comments User Context   01/10/2019 0250 01/10/2019 1051 Full Code [id number]  Shela Leff, MD ED   07/14/2016 0839 07/16/2016 1520 Full Code [id number]  Radene Gunning, NP Inpatient    Advance Directive Documentation     Most Recent Value  Type of Advance Directive  Out of facility DNR (pink MOST or yellow form)  Pre-existing out of facility DNR order (yellow form or pink MOST form)  -- [current partial code]  "MOST" Form in Place?  -        IV Access:    Peripheral IV   Procedures and diagnostic studies:   No results found.   Medical Consultants:    None.  Anti-Infectives:   IV Rocephin and azithro  Subjective:    Anne Macias she relates she feels more tired and sleepy.  Objective:    Vitals:   01/14/19 0030 01/14/19 0400 01/14/19  0500 01/14/19 0800  BP: 105/72 99/71  107/60  Pulse: 88 83  70  Resp: (!) 27 (!) 29  20  Temp: 98.8 F (37.1 C) 98.9 F (37.2 C)  98 F (36.7 C)  TempSrc: Oral Oral  Oral  SpO2: (!) 88% 90%  91%  Weight:   75.8 kg   Height:        Intake/Output Summary (Last 24 hours) at 01/14/2019 0953 Last data filed at 01/14/2019 0700 Gross per 24 hour  Intake 300 ml  Output -  Net 300 ml   Filed Weights   01/10/19  0601 01/12/19 0335 01/14/19 0500  Weight: 74.2 kg 70.1 kg 75.8 kg    Exam: General exam: In no acute distress. Respiratory system: Air movement with crackles at bases Cardiovascular system: S1 & S2 heard, RRR.   Gastrointestinal system: Abdomen is nondistended, soft and nontender.  Central nervous system: Alert and oriented. No focal neurological deficits. Extremities: 3+ edema Skin: No rashes, lesions or ulcers Psychiatry: Judgement and insight appear normal. Mood & affect appropriate.    Data Reviewed:    Labs: Basic Metabolic Panel: Recent Labs  Lab 01/27/2019 2351 01/10/19 0252 01/11/19 0618 01/12/19 0619 01/13/19 0611 01/14/19 0605  NA 139 139 137 140 138 137  K 4.0 4.1 4.0 3.7 3.5 3.5  CL 102 106 105 104 105 104  CO2 25 21* 22 25 23 24   GLUCOSE 117* 101* 112* 103* 111* 101*  BUN 46* 45* 43* 42* 40* 38*  CREATININE 1.96* 1.81* 1.68* 1.53* 1.53* 1.57*  CALCIUM 8.4* 8.4* 7.8* 7.7* 7.4* 7.5*  MG 2.3  --   --   --   --   --    GFR Estimated Creatinine Clearance: 23.8 mL/min (A) (by C-G formula based on SCr of 1.57 mg/dL (H)). Liver Function Tests: Recent Labs  Lab 01/01/2019 2351  AST 20  ALT 22  ALKPHOS 175*  BILITOT 1.1  PROT 6.3*  ALBUMIN 2.3*   No results for input(s): LIPASE, AMYLASE in the last 168 hours. No results for input(s): AMMONIA in the last 168 hours. Coagulation profile Recent Labs  Lab 01/10/19 0339 01/11/19 0618 01/12/19 0619 01/13/19 0611 01/14/19 0605  INR 2.59 2.94 2.81 2.70 2.54    CBC: Recent Labs  Lab 01/11/2019 2351  WBC 8.2  NEUTROABS 5.5  HGB 11.7*  HCT 40.9  MCV 81.3  PLT 233   Cardiac Enzymes: Recent Labs  Lab 12/31/2018 2351  TROPONINI <0.03   BNP (last 3 results) No results for input(s): PROBNP in the last 8760 hours. CBG: No results for input(s): GLUCAP in the last 168 hours. D-Dimer: No results for input(s): DDIMER in the last 72 hours. Hgb A1c: No results for input(s): HGBA1C in the last 72  hours. Lipid Profile: No results for input(s): CHOL, HDL, LDLCALC, TRIG, CHOLHDL, LDLDIRECT in the last 72 hours. Thyroid function studies: No results for input(s): TSH, T4TOTAL, T3FREE, THYROIDAB in the last 72 hours.  Invalid input(s): FREET3 Anemia work up: No results for input(s): VITAMINB12, FOLATE, FERRITIN, TIBC, IRON, RETICCTPCT in the last 72 hours. Sepsis Labs: Recent Labs  Lab 01/08/2019 2351  WBC 8.2   Microbiology Recent Results (from the past 240 hour(s))  Urine culture     Status: Abnormal   Collection Time: 01/06/2019 11:51 PM  Result Value Ref Range Status   Specimen Description URINE, CLEAN CATCH  Final   Special Requests   Final    NONE Performed at [facility],  [address] [place], [place] [address]    Culture >=100,000 COLONIES/mL ESCHERICHIA COLI (A)  Final   Report Status 01/12/2019 FINAL  Final   Organism ID, Bacteria ESCHERICHIA COLI (A)  Final      Susceptibility   Escherichia coli - MIC*    AMPICILLIN <=2 SENSITIVE Sensitive     CEFAZOLIN <=4 SENSITIVE Sensitive     CEFTRIAXONE <=1 SENSITIVE Sensitive     CIPROFLOXACIN <=0.25 SENSITIVE Sensitive     GENTAMICIN <=1 SENSITIVE Sensitive     IMIPENEM <=0.25 SENSITIVE Sensitive     NITROFURANTOIN <=16 SENSITIVE Sensitive     TRIMETH/SULFA <=20 SENSITIVE Sensitive     AMPICILLIN/SULBACTAM <=2 SENSITIVE Sensitive     PIP/TAZO <=4 SENSITIVE Sensitive     Extended ESBL NEGATIVE Sensitive     * >=100,000 COLONIES/mL ESCHERICHIA COLI  Blood culture (routine x 2)     Status: None (Preliminary result)   Collection Time: 01/10/19  1:30 AM  Result Value Ref Range Status   Specimen Description BLOOD RIGHT ANTECUBITAL  Final   Special Requests   Final    BOTTLES DRAWN AEROBIC AND ANAEROBIC Blood Culture adequate volume   Culture   Final    NO GROWTH 4 DAYS Performed at [facility], [place] [address], [place], [place] [address]    Report Status PENDING  Incomplete  Blood culture (routine x 2)      Status: None (Preliminary result)   Collection Time: 01/10/19  1:34 AM  Result Value Ref Range Status   Specimen Description BLOOD RIGHT FOREARM  Final   Special Requests   Final    BOTTLES DRAWN AEROBIC AND ANAEROBIC Blood Culture results may not be optimal due to an inadequate volume of blood received in culture bottles   Culture   Final    NO GROWTH 4 DAYS Performed at [facility], [place] [address] [place], [place] [address]    Report Status PENDING  Incomplete     Medications:   . amiodarone  200 mg Oral BID  . azithromycin  500 mg Oral Daily  . furosemide  40 mg Intravenous Daily  . levothyroxine  25 mcg Oral Q0600  . metoprolol succinate  100 mg Oral BID  . warfarin  1 mg Oral Q M,W,F-1800  . warfarin  2 mg Oral Q T,Th,S,Su-1800  . Warfarin - Pharmacist Dosing Inpatient   Does not apply q1800   Continuous Infusions: . cefTRIAXone (ROCEPHIN)  IV 1 g (01/13/19 2116)     LOS: 4 days   Charlynne Cousins  [facility]  01/14/2019, 9:53 AM

## 2019-01-14 NOTE — Progress Notes (Signed)
  Consult received, chart reviewed.  Went by to see patient, daughter and husband are at the bedside.  Patient now is on BiPAP.  Family requested that I come back on 01/15/2019 after trial of BiPAP, after meeting with pulmonology and further information from cardiology. I will follow-up on 01/15/2019 Thank you for consulting palliative medicine. Romona Curls, NP

## 2019-01-14 NOTE — Progress Notes (Signed)
ANTICOAGULATION CONSULT NOTE - Follow Up Consult  Pharmacy Consult for Coumadin/vanc Indication: atrial fibrillation/PNA  No Known Allergies  Patient Measurements: Height: 5\' 7"  (170.2 cm) Weight: 167 lb 1.7 oz (75.8 kg) IBW/kg (Calculated) : 61.6  Vital Signs: Temp: 97.7 F (36.5 C) (02/15 1216) Temp Source: Axillary (02/15 1216) BP: 114/73 (02/15 1216) Pulse Rate: 91 (02/15 1216)  Labs: Recent Labs    01/12/19 0619 01/13/19 0611 01/14/19 0605  LABPROT 29.1* 28.3* 27.0*  INR 2.81 2.70 2.54  CREATININE 1.53* 1.53* 1.57*    Estimated Creatinine Clearance: 23.8 mL/min (A) (by C-G formula based on SCr of 1.57 mg/dL (H)).  Assessment:  71 YOF admitted for weakness w/ possible PNA/UTI, to continue Coumadin for Afib, INR on admit at goal (2.67) and last PTA dose 2/9.  Missed dose 2/10 due to in ED that night and Coumadin continued on 2/11.   PTA dosing: 2mg  TTSS and 1mg  MWF  INR therapeutic at 2.54 on home regimen.  CBC stable.  Abx changed to vanc/cefepime today. Scr 1.57, vanc 1.25g IV q48>>AUC 481  Goal of Therapy:  INR 2-3 Monitor platelets by anticoagulation protocol: Yes   Plan:  Continue home regimen of Coumadin 2mg  TTSS and 1mg  MWF Daily INR, s/s bleeding Vanc 1.25g IV q48  Onnie Boer, PharmD, BCIDP, AAHIVP, CPP Infectious Disease Pharmacist 01/14/2019 1:00 PM

## 2019-01-15 ENCOUNTER — Inpatient Hospital Stay (HOSPITAL_COMMUNITY): Payer: Medicare Other

## 2019-01-15 ENCOUNTER — Encounter (HOSPITAL_COMMUNITY): Payer: Self-pay

## 2019-01-15 DIAGNOSIS — I340 Nonrheumatic mitral (valve) insufficiency: Secondary | ICD-10-CM

## 2019-01-15 DIAGNOSIS — R0603 Acute respiratory distress: Secondary | ICD-10-CM

## 2019-01-15 DIAGNOSIS — I361 Nonrheumatic tricuspid (valve) insufficiency: Secondary | ICD-10-CM

## 2019-01-15 DIAGNOSIS — J9601 Acute respiratory failure with hypoxia: Secondary | ICD-10-CM

## 2019-01-15 LAB — COMPREHENSIVE METABOLIC PANEL
ALT: 44 U/L (ref 0–44)
AST: 50 U/L — ABNORMAL HIGH (ref 15–41)
Albumin: 1.6 g/dL — ABNORMAL LOW (ref 3.5–5.0)
Alkaline Phosphatase: 125 U/L (ref 38–126)
Anion gap: 12 (ref 5–15)
BILIRUBIN TOTAL: 1.9 mg/dL — AB (ref 0.3–1.2)
BUN: 45 mg/dL — ABNORMAL HIGH (ref 8–23)
CO2: 25 mmol/L (ref 22–32)
Calcium: 7.7 mg/dL — ABNORMAL LOW (ref 8.9–10.3)
Chloride: 102 mmol/L (ref 98–111)
Creatinine, Ser: 1.78 mg/dL — ABNORMAL HIGH (ref 0.44–1.00)
GFR calc Af Amer: 28 mL/min — ABNORMAL LOW (ref >60)
GFR calc non Af Amer: 24 mL/min — ABNORMAL LOW (ref >60)
Glucose, Bld: 95 mg/dL (ref 70–99)
Potassium: 3.8 mmol/L (ref 3.5–5.1)
Sodium: 139 mmol/L (ref 135–145)
Total Protein: 5.5 g/dL — ABNORMAL LOW (ref 6.5–8.1)

## 2019-01-15 LAB — BASIC METABOLIC PANEL
Anion gap: 13 (ref 5–15)
BUN: 43 mg/dL — ABNORMAL HIGH (ref 8–23)
CO2: 23 mmol/L (ref 22–32)
Calcium: 7.9 mg/dL — ABNORMAL LOW (ref 8.9–10.3)
Chloride: 103 mmol/L (ref 98–111)
Creatinine, Ser: 1.69 mg/dL — ABNORMAL HIGH (ref 0.44–1.00)
GFR calc Af Amer: 30 mL/min — ABNORMAL LOW (ref >60)
GFR calc non Af Amer: 26 mL/min — ABNORMAL LOW (ref >60)
Glucose, Bld: 106 mg/dL — ABNORMAL HIGH (ref 70–99)
Potassium: 4 mmol/L (ref 3.5–5.1)
Sodium: 139 mmol/L (ref 135–145)

## 2019-01-15 LAB — PROTIME-INR
INR: 2.5
Prothrombin Time: 26.6 seconds — ABNORMAL HIGH (ref 11.4–15.2)

## 2019-01-15 LAB — CULTURE, BLOOD (ROUTINE X 2)
Culture: NO GROWTH
Culture: NO GROWTH
Special Requests: ADEQUATE

## 2019-01-15 LAB — ECHOCARDIOGRAM LIMITED
HEIGHTINCHES: 67 in
Weight: 2673.74 oz

## 2019-01-15 LAB — LACTIC ACID, PLASMA
Lactic Acid, Venous: 2.4 mmol/L (ref 0.5–1.9)
Lactic Acid, Venous: 2.1 mmol/L (ref 0.5–1.9)

## 2019-01-15 LAB — MAGNESIUM: Magnesium: 2.1 mg/dL (ref 1.7–2.4)

## 2019-01-15 LAB — CORTISOL: Cortisol, Plasma: 34.7 ug/dL

## 2019-01-15 LAB — PHOSPHORUS: Phosphorus: 3.3 mg/dL (ref 2.5–4.6)

## 2019-01-15 LAB — AMMONIA: Ammonia: 22 umol/L (ref 9–35)

## 2019-01-15 LAB — TSH: TSH: 3.341 u[IU]/mL (ref 0.350–4.500)

## 2019-01-15 LAB — PROCALCITONIN: Procalcitonin: 0.6 ng/mL

## 2019-01-15 LAB — APTT: aPTT: 45 seconds — ABNORMAL HIGH (ref 24–36)

## 2019-01-15 MED ORDER — METOPROLOL SUCCINATE ER 25 MG PO TB24: 50.0000 mg | ORAL_TABLET | Freq: Two times a day (BID) | ORAL | Status: DC

## 2019-01-15 MED ORDER — FUROSEMIDE 10 MG/ML IJ SOLN
40.0000 mg | Freq: Once | INTRAMUSCULAR | Status: AC
  Administered 2019-01-15: 40 mg via INTRAVENOUS
  Filled 2019-01-15: qty 4

## 2019-01-15 MED ORDER — ALBUMIN HUMAN 5 % IV SOLN
12.5000 g | INTRAVENOUS | Status: AC
  Administered 2019-01-15: 12.5 g via INTRAVENOUS
  Filled 2019-01-15 (×2): qty 250

## 2019-01-15 MED ORDER — METHYLPREDNISOLONE SODIUM SUCC 125 MG IJ SOLR
60.0000 mg | INTRAMUSCULAR | Status: AC
  Administered 2019-01-15: 60 mg via INTRAVENOUS
  Filled 2019-01-15: qty 2

## 2019-01-15 MED ORDER — METOPROLOL TARTRATE 5 MG/5ML IV SOLN
5.0000 mg | Freq: Four times a day (QID) | INTRAVENOUS | Status: DC
  Administered 2019-01-15: 5 mg via INTRAVENOUS
  Filled 2019-01-15: qty 5

## 2019-01-15 MED ORDER — METHYLPREDNISOLONE SODIUM SUCC 40 MG IJ SOLR
40.0000 mg | Freq: Two times a day (BID) | INTRAMUSCULAR | Status: DC
  Administered 2019-01-15 – 2019-01-17 (×4): 40 mg via INTRAVENOUS
  Filled 2019-01-15 (×4): qty 1

## 2019-01-15 MED ORDER — METOPROLOL TARTRATE 5 MG/5ML IV SOLN
INTRAVENOUS | Status: AC
  Administered 2019-01-15: 09:00:00
  Filled 2019-01-15: qty 5

## 2019-01-15 MED ORDER — LACTATED RINGERS IV BOLUS
250.0000 mL | Freq: Once | INTRAVENOUS | Status: AC
  Administered 2019-01-15: 250 mL via INTRAVENOUS

## 2019-01-15 MED ORDER — LORAZEPAM 0.5 MG PO TABS
0.5000 mg | ORAL_TABLET | Freq: Once | ORAL | Status: DC
  Filled 2019-01-15: qty 1

## 2019-01-15 MED ORDER — LACTATED RINGERS IV SOLN
INTRAVENOUS | Status: DC
  Administered 2019-01-15: 17:00:00 via INTRAVENOUS

## 2019-01-15 MED ORDER — LORAZEPAM 2 MG/ML IJ SOLN
0.5000 mg | Freq: Once | INTRAMUSCULAR | Status: AC
  Administered 2019-01-15: 0.5 mg via INTRAVENOUS
  Filled 2019-01-15: qty 1

## 2019-01-15 MED ORDER — LIDOCAINE HCL (PF) 1 % IJ SOLN
INTRAMUSCULAR | Status: AC
  Administered 2019-01-15: 09:00:00
  Filled 2019-01-15: qty 30

## 2019-01-15 NOTE — Progress Notes (Signed)
   Patient seen, chart reviewed.  Patient still on BiPAP.  Underwent thoracentesis on the left for 600 mL's.  Per cardiology, transfer to ICU for increased monitoring  Went to the bedside to speak with patient's family and to introduce palliative medicine as an additional resource and source of support.  Dr. Harlow Mares, patient's son, was at the bedside.  Goals are in place now with plan to continue to try to treat the treatable.  Palliative medicine to shadow chart for place to reengage should patient continue to decline. Will update attending. Staffed with CCM. Palliative role more in background for now Thank you, Romona Curls

## 2019-01-15 NOTE — Progress Notes (Signed)
Pt A&Ox4 upon initial assessment. At roughly 2130, pt was sleeping hard & O2 sats were 80-82 on HFNC. Pt was somewhat difficult to wake up. Pt placed on bipap w/ RT in room. Sats normalized to 96-100%. Pt began to wake up & open eyes spontaneously roughly 45mins later. VSS.   Will continue to monitor closely.

## 2019-01-15 NOTE — Progress Notes (Signed)
  Echocardiogram 2D Echocardiogram limited STAT echo has been performed.  Darlina Sicilian M 01/15/2019, 3:44 PM

## 2019-01-15 NOTE — Progress Notes (Signed)
Arrived to ICU via bed patient obtunded, responds to name. Placed on monitor, ordered interventions performed. Family in updated on status. Fingers /nailbeds dusky wrapped in warm blankets, wanted water, having swallowing difficulty, e qual grips bilaterally, foot strength, MD aware, will  Keep NPO for now, moth swabs for patient for comfort. Patient drowsy, tolerating fluids, and HFNC.

## 2019-01-15 NOTE — Significant Event (Signed)
Rapid Response Event Note  Overview:  Initial Focused Assessment: Called by RN about patient having low oxygen saturations, per RN, patient has been on NRB since day shift, patient was briefly on BIPAP during the day at some point as well. 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 Patient received Ativan 0.5 mg IV at 0533 and was placed on the BIPAP, initially did well but per RN is still desatting now. I asked the RN to page the MD on call and I went to see the patient.   Upon arrival, patient was very lethargic, RR 20-22, not in acute distress, lung sounds diminished throughout, her pulse ox was not reading an accurate saturation, I replaced the probe and the saturation was 100%. Currently patient is on 16/6 100% on the BIPAP.Patient was third spacing and has been, skin was cool to touch, + PPM. BP are soft this morning as well. HR - fluctuating, at times is paced  Interventions: -- STAT CXR   Plan of Care: -- MD on call called back, updated on situation, day time MD also was contacted and came to the bedside as well.  Event Summary:    at    Call Time [place] End Time 0710   Anne Macias

## 2019-01-15 NOTE — Progress Notes (Addendum)
[facility] PROGRESS NOTE    Progress Note  Anne Macias  BMW:[id number] DOB: [birth year] DOA: 01/21/2019 PCP: Lavone Orn, MD     Brief Narrative:   Anne Macias is an 85 y.o. female past medical history of paroxysmal atrial fibrillation on amiodarone and Coumadin, tachybradycardia syndrome status post pacemaker presents to the hospital for evaluation of weakness and fall at home as per her husband her dose of amiodarone was increased about a week ago  Assessment/Plan:   Fall due to suspected  possible community-acquired pneumonia: Urine culture grew more than 100,000 colonies of E. coli sensitive to ampicillin. CT of the chest showed groundglass opacity in both upper lung left greater than right. She started getting hypoxic her antibiotic coverage was broadened to IV vancomycin and cefepime. Culture data has remained negative.  Acute diastolic heart failure and right-sided heart failure/possible cardiorenal syndrome/right-sided pleural effusion: Appreciate cardiology's assistance. BNP is elevated has worsened, chest x-ray show worsening pulmonary edema, her urinary sodium is less than 10, pointing that she is hypoperfusing her kidneys. We will give her a single dose of IV Lasix. She has remained afebrile, she is currently on BiPAP to try to keep her saturations above 90%. We have consulted IR for thoracocentesis.  Pulmonary was consulted for thoracocentesis, they recommended to consult IR.  Acute respiratory failure with hypoxia due to community-acquired pneumonia and heart failure: Patient oxygen requirement has increased. IR consulted for thoracocentesis of her right pleural effusion.  ACute kidney injury on chronic kidney disease stage III: With a baseline creatinine around 1. Patient metabolic panel is pending this morning.  Paroxysmal atrial fibrillation: CHA2DS2VASc 4.,  INR therapeutic holding Coumadin for thoracocentesis.   Hypothyroidism: Check TSH  and T4 in 6 weeks once those have resolved.    DVT prophylaxis: lovenxo Family Communication:none Disposition Plan/Barrier to D/C: home 2-3 days Code Status:     Code Status Orders  (From admission, onward)         Start     Ordered   01/10/19 1052  Limited resuscitation (code)  Continuous    Question Answer Comment  In the event of cardiac or respiratory ARREST: Initiate Code Blue, Call Rapid Response Yes   In the event of cardiac or respiratory ARREST: Perform CPR No   In the event of cardiac or respiratory ARREST: Perform Intubation/Mechanical Ventilation No   In the event of cardiac or respiratory ARREST: Use NIPPV/BiPAp only if indicated Yes   In the event of cardiac or respiratory ARREST: Administer ACLS medications if indicated Yes   In the event of cardiac or respiratory ARREST: Perform Defibrillation or Cardioversion if indicated Yes      01/10/19 1051        Code Status History    Date Active Date Inactive Code Status Order ID Comments User Context   01/10/2019 0250 01/10/2019 1051 Full Code [id number]  Shela Leff, MD ED   07/14/2016 0839 07/16/2016 1520 Full Code [id number]  Radene Gunning, NP Inpatient    Advance Directive Documentation     Most Recent Value  Type of Advance Directive  Out of facility DNR (pink MOST or yellow form)  Pre-existing out of facility DNR order (yellow form or pink MOST form)  -- [current partial code]  "MOST" Form in Place?  -        IV Access:    Peripheral IV   Procedures and diagnostic studies:   Dg Chest Port 1 View  Result Date: 01/14/2019 CLINICAL DATA:  Shortness of breath. EXAM: PORTABLE CHEST 1 VIEW COMPARISON:  CT 01/10/2019.  Radiographs 01/20/2019. FINDINGS: 1042 hours. Right subclavian pacemaker leads are unchanged at the right atrium and right ventricular apex. The heart size and mediastinal contours are stable. There is aortic atherosclerosis. Moderate size bilateral pleural effusions have not significantly  changed. However, there are worsening bilateral perihilar airspace opacities and poor definition of the pulmonary vasculature. No pneumothorax. The bones appear unchanged. IMPRESSION: 1. Worsening bilateral perihilar opacities could reflect edema or atypical pneumonia. 2. Underlying bilateral pleural effusions and bibasilar atelectasis have not significantly changed from the recent prior studies. Electronically Signed   By: Richardean Sale M.D.   On: 01/14/2019 11:18     Medical Consultants:    None.  Anti-Infectives:   IV Rocephin and azithro  Subjective:    [name] she relates she feels more tired and sleepy.  Rapid response called early in the morning and she started the setting and being coming more confused.  Objective:    Vitals:   01/14/19 1700 01/14/19 2036 01/14/19 2354 01/15/19 0441  BP:  (!) 103/48 101/69 102/61  Pulse: 85 88 90 90  Resp: (!) 22 (!) 30 (!) 22 (!) 23  Temp:  98.4 F (36.9 C) 98.1 F (36.7 C) 97.7 F (36.5 C)  TempSrc:  Axillary Oral   SpO2: 91% 97% 92% 90%  Weight:      Height:        Intake/Output Summary (Last 24 hours) at 01/15/2019 0743 Last data filed at 01/14/2019 1700 Gross per 24 hour  Intake 339.6 ml  Output 400 ml  Net -60.4 ml   Filed Weights   01/10/19 0601 01/12/19 0335 01/14/19 0500  Weight: 74.2 kg 70.1 kg 75.8 kg    Exam: General exam: In no acute distress. Respiratory system: Air movement with crackles at bases Cardiovascular system: S1 & S2 heard, RRR.   Gastrointestinal system: Abdomen is nondistended, soft and nontender.  Central nervous system: Alert and oriented. No focal neurological deficits. Extremities: 3+ edema Skin: No rashes, lesions or ulcers Psychiatry: Judgement and insight appear normal. Mood & affect appropriate.    Data Reviewed:    Labs: Basic Metabolic Panel: Recent Labs  Lab 01/04/2019 2351 01/10/19 0252 01/11/19 0618 01/12/19 0619 01/13/19 0611 01/14/19 0605  NA 139 139 137  140 138 137  K 4.0 4.1 4.0 3.7 3.5 3.5  CL 102 106 105 104 105 104  CO2 25 21* 22 25 23 24   GLUCOSE 117* 101* 112* 103* 111* 101*  BUN 46* 45* 43* 42* 40* 38*  CREATININE 1.96* 1.81* 1.68* 1.53* 1.53* 1.57*  CALCIUM 8.4* 8.4* 7.8* 7.7* 7.4* 7.5*  MG 2.3  --   --   --   --   --    GFR Estimated Creatinine Clearance: 23.8 mL/min (A) (by C-G formula based on SCr of 1.57 mg/dL (H)). Liver Function Tests: Recent Labs  Lab 01/15/2019 2351  AST 20  ALT 22  ALKPHOS 175*  BILITOT 1.1  PROT 6.3*  ALBUMIN 2.3*   No results for input(s): LIPASE, AMYLASE in the last 168 hours. No results for input(s): AMMONIA in the last 168 hours. Coagulation profile Recent Labs  Lab 01/10/19 0339 01/11/19 0618 01/12/19 0619 01/13/19 0611 01/14/19 0605  INR 2.59 2.94 2.81 2.70 2.54    CBC: Recent Labs  Lab 01/25/2019 2351  WBC 8.2  NEUTROABS 5.5  HGB 11.7*  HCT 40.9  MCV 81.3  PLT 233   Cardiac  Enzymes: Recent Labs  Lab 01/27/2019 2351  TROPONINI <0.03   BNP (last 3 results) No results for input(s): PROBNP in the last 8760 hours. CBG: No results for input(s): GLUCAP in the last 168 hours. D-Dimer: No results for input(s): DDIMER in the last 72 hours. Hgb A1c: No results for input(s): HGBA1C in the last 72 hours. Lipid Profile: No results for input(s): CHOL, HDL, LDLCALC, TRIG, CHOLHDL, LDLDIRECT in the last 72 hours. Thyroid function studies: No results for input(s): TSH, T4TOTAL, T3FREE, THYROIDAB in the last 72 hours.  Invalid input(s): FREET3 Anemia work up: No results for input(s): VITAMINB12, FOLATE, FERRITIN, TIBC, IRON, RETICCTPCT in the last 72 hours. Sepsis Labs: Recent Labs  Lab 01/10/2019 2351  WBC 8.2   Microbiology Recent Results (from the past 240 hour(s))  Urine culture     Status: Abnormal   Collection Time: 01/27/2019 11:51 PM  Result Value Ref Range Status   Specimen Description URINE, CLEAN CATCH  Final   Special Requests   Final    NONE Performed at  [facility], [place] [address] [place], [place] [address]    Culture >=100,000 COLONIES/mL ESCHERICHIA COLI (A)  Final   Report Status 01/12/2019 FINAL  Final   Organism ID, Bacteria ESCHERICHIA COLI (A)  Final      Susceptibility   Escherichia coli - MIC*    AMPICILLIN <=2 SENSITIVE Sensitive     CEFAZOLIN <=4 SENSITIVE Sensitive     CEFTRIAXONE <=1 SENSITIVE Sensitive     CIPROFLOXACIN <=0.25 SENSITIVE Sensitive     GENTAMICIN <=1 SENSITIVE Sensitive     IMIPENEM <=0.25 SENSITIVE Sensitive     NITROFURANTOIN <=16 SENSITIVE Sensitive     TRIMETH/SULFA <=20 SENSITIVE Sensitive     AMPICILLIN/SULBACTAM <=2 SENSITIVE Sensitive     PIP/TAZO <=4 SENSITIVE Sensitive     Extended ESBL NEGATIVE Sensitive     * >=100,000 COLONIES/mL ESCHERICHIA COLI  Blood culture (routine x 2)     Status: None   Collection Time: 01/10/19  1:30 AM  Result Value Ref Range Status   Specimen Description BLOOD RIGHT ANTECUBITAL  Final   Special Requests   Final    BOTTLES DRAWN AEROBIC AND ANAEROBIC Blood Culture adequate volume   Culture   Final    NO GROWTH 5 DAYS Performed at [facility], [place] [address], [place], [place] [address]    Report Status 01/15/2019 FINAL  Final  Blood culture (routine x 2)     Status: None   Collection Time: 01/10/19  1:34 AM  Result Value Ref Range Status   Specimen Description BLOOD RIGHT FOREARM  Final   Special Requests   Final    BOTTLES DRAWN AEROBIC AND ANAEROBIC Blood Culture results may not be optimal due to an inadequate volume of blood received in culture bottles   Culture   Final    NO GROWTH 5 DAYS Performed at [facility], [place] [address] [place], [place] [address]    Report Status 01/15/2019 FINAL  Final  MRSA PCR Screening     Status: None   Collection Time: 01/14/19  1:01 PM  Result Value Ref Range Status   MRSA by PCR NEGATIVE NEGATIVE Final    Comment:        The GeneXpert MRSA Assay (FDA approved for NASAL specimens only), is one  component of a comprehensive MRSA colonization surveillance program. It is not intended to diagnose MRSA infection nor to guide or monitor treatment for  MRSA infections. Performed at [facility], [place] [address] [place], [place] [address]      Medications:   . amiodarone  200 mg Oral BID  . levothyroxine  25 mcg Oral Q0600  . metoprolol succinate  100 mg Oral BID   Continuous Infusions: . ceFEPime (MAXIPIME) IV Stopped (01/14/19 1355)  . vancomycin Stopped (01/14/19 1633)     LOS: 5 days   Charlynne Cousins  [facility]  01/15/2019, 7:43 AM

## 2019-01-15 NOTE — Plan of Care (Signed)
Transfer to ICU respiratory distress, increase oxygen demand.  On HFO2  post thoracentesis. Pneumonia.

## 2019-01-15 NOTE — Consult Note (Signed)
NAME:  Anne Macias, MRN:  [id number], DOB:  27-Sep-1923, LOS: 5 ADMISSION DATE:  01/18/2019, CONSULTATION DATE:  2.16.2020 REFERRING MD: [facility]ospital[facility], CHIEF COMPLAINT:  Hypoxia, hypotension   Brief History   85 yo female admitted for fall and weakness with UTI. Treated appropriately, she was also diuresed. Has developed progressive dyspnea culminating in RRT and BiPap. Given ativan for agitation on bipap. Appears worse with diuresis. Recent initiation and/or increase in amiodarone as well  History of present illness   Anne Macias is an 85 y.o. female past medical history of paroxysmal atrial fibrillation on amiodarone and Coumadin. Status post pacemaker for Tachy-Brady disease. Originally presented to the hospital for evaluation of weakness and fall at home. Amiodarone started and increased about 12 days ago. Has had swelling in legs.  Past Medical History   Past Medical History:  Diagnosis Date  . Chronic anticoagulation   . Edema of lower extremity   . Hyperlipidemia   . Hypertension   . Hypothyroidism   . PAF (paroxysmal atrial fibrillation) ([place]) 1998  . S/P cardiac pacemaker procedure Sept 3545   complicated by pocket hematoma  . Tachy-brady syndrome ([place])   . Tremor 05/08/2014  . Visual disturbance    Past Surgical History:  Procedure Laterality Date  . CARDIOVERSION N/A 02/26/2014   Procedure: CARDIOVERSION;  Surgeon: Deboraha Sprang, MD;  Location: [facility] ENDOSCOPY;  Service: Cardiovascular;  Laterality: N/A;  . CARDIOVERSION N/A 03/19/2014   Procedure: CARDIOVERSION;  Surgeon: Salimatou Spark, MD;  Location: [facility] ENDOSCOPY;  Service: Cardiovascular;  Laterality: N/A;  . CARDIOVERSION N/A 06/17/2015   Procedure: CARDIOVERSION;  Surgeon: Lener Spark, MD;  Location: [place];  Service: Cardiovascular;  Laterality: N/A;  . CATARACT EXTRACTION    . CHOLECYSTECTOMY  2005  . INSERT / REPLACE / REMOVE PACEMAKER  08/28/2010   implantaton  . US  ECHOCARDIOGRAPHY  04/10/2010   EF 55-60%     Significant Hospital Events   RRT overnight. Required BiPap. CCM consulted when family asked for full aggressive treatment given rapid and sudden decline CCM performed echocardiogram which showed significantly underfilled LV with dynamic LVOT. Started on IV fluids for dehydration and hypovolemia  Consults:  Cardiology Palliative care  Procedures:  Echocardiogram  Significant Diagnostic Tests:  Echocardiogram: 2.11.2020 IMPRESSIONS    1. The left ventricle has normal systolic function of 62-56%. The cavity size was normal. Left ventricular diastology could not be evaluated secondary to atrial fibrillation.  2. The right ventricle has moderately reduced systolic function. The cavity was normal. There is no increase in right ventricular wall thickness. Right ventricular systolic pressure is moderately elevated with an estimated pressure of 51.4 mmHg.  3. Left atrial size was moderately dilated.  4. Right atrial size was mildly dilated.  5. The mitral valve is normal in structure. There is mild thickening. There is mild to moderate mitral annular calcification present.  6. The tricuspid valve is normal in structure. Tricuspid valve regurgitation is moderate-severe.  7. The aortic valve is tricuspid There is mild thickening and mild calcification of the aortic valve. Aortic valve regurgitation is trivial by color flow Doppler.  8. The aortic root and ascending aorta are normal in size and structure.  9. The inferior vena cava was dilated in size with <50% respiratory variability.  FINDINGS  Left Ventricle: The left ventricle has normal systolic function of 38-93%. The cavity size was normal. There is no increase in left ventricular wall thickness. Left ventricular diastology could not  be evaluated secondary to atrial fibrillation. Right Ventricle: The right ventricle has moderately reduced systolic function. The cavity was normal. There is  no increase in right ventricular wall thickness. Right ventricular systolic pressure is moderately elevated with an estimated pressure of 51.4  mmHg. Pacing wire/catheter visualized in the right ventricle. Left Atrium: left atrial size was moderately dilated Right Atrium: right atrial size was mildly dilated Interatrial Septum: No atrial level shunt detected by color flow Doppler. Pericardium: There is no evidence of pericardial effusion. There is pleural effusion in both left and right lateral regions. Mitral Valve: The mitral valve is normal in structure. There is mild thickening. There is mild to moderate mitral annular calcification present. Mitral valve regurgitation is mild by color flow Doppler. Tricuspid Valve: The tricuspid valve is normal in structure. Tricuspid valve regurgitation is moderate-severe by color flow Doppler. Aortic Valve: The aortic valve is tricuspid There is mild thickening and mild calcification of the aortic valve. Aortic valve regurgitation is trivial by color flow Doppler. Pulmonic Valve: The pulmonic valve was not well visualized. The pulmonic valve was grossly normal. Pulmonic valve regurgitation is not visualized by color flow Doppler. Aorta: The aortic root and ascending aorta are normal in size and structure. Venous: The inferior vena cava is dilated in size with less than 50% respiratory variability.   LEFT VENTRICLE PLAX 2D (Teich) LV EF:          68.4 % LVIDd:          3.53 cm LVIDs:          2.21 cm LV PW:          0.94 cm LV IVS:         0.91 cm LVOT diam:      1.70 cm LV SV:          36 ml LVOT Area:      2.27 cm  RIGHT VENTRICLE RV S prime:     7.18 cm/s TAPSE (M-mode): 0.4 cm RVSP:           51.4 mmHg  LEFT ATRIUM             Index       RIGHT ATRIUM           Index LA diam:        4.50 cm 2.42 cm/m  RA Pressure: 15 mmHg LA Vol (A2C):   64.3 ml 34.63 ml/m RA Area:     23.70 cm LA Vol (A4C):   47.3 ml 25.48 ml/m RA Volume:   72.20 ml   38.89 ml/m LA Biplane Vol: 60.2 ml 32.42 ml/m  AORTIC VALVE LVOT Vmax:   66.30 cm/s LVOT Vmean:  49.000 cm/s LVOT VTI:    0.119 m   AORTA Ao Root diam: 2.90 cm  MITRAL VALVE              TR Peak grad: 36.4 mmHg MV Area (PHT): 5.54 cm   TR Vmax:      336.00 cm/s MV PHT:        39.73 msec RVSP:         51.4 mmHg MV Decel Time: 137 msec MV E velocity: 87.97 cm/s  IMPRESSION: Bilateral pleural effusions, right larger than left. Compressive atelectasis in the lower lobes.  Ground-glass opacities in both upper lobes, left greater than right could reflect pneumonia.  Cardiomegaly, coronary artery disease.  Aortic Atherosclerosis  IMPRESSION: 1. No hydronephrosis or other acute findings. 2. Mild bilateral renal  atrophy. Micro Data:   Results for orders placed or performed during the hospital encounter of 01/06/2019  Urine culture     Status: Abnormal   Collection Time: 01/16/2019 11:51 PM  Result Value Ref Range Status   Specimen Description URINE, CLEAN CATCH  Final   Special Requests   Final    NONE Performed at [facility], [place] [address], [place], [place] [address]    Culture >=100,000 COLONIES/mL ESCHERICHIA COLI (A)  Final   Report Status 01/12/2019 FINAL  Final   Organism ID, Bacteria ESCHERICHIA COLI (A)  Final      Susceptibility   Escherichia coli - MIC*    AMPICILLIN <=2 SENSITIVE Sensitive     CEFAZOLIN <=4 SENSITIVE Sensitive     CEFTRIAXONE <=1 SENSITIVE Sensitive     CIPROFLOXACIN <=0.25 SENSITIVE Sensitive     GENTAMICIN <=1 SENSITIVE Sensitive     IMIPENEM <=0.25 SENSITIVE Sensitive     NITROFURANTOIN <=16 SENSITIVE Sensitive     TRIMETH/SULFA <=20 SENSITIVE Sensitive     AMPICILLIN/SULBACTAM <=2 SENSITIVE Sensitive     PIP/TAZO <=4 SENSITIVE Sensitive     Extended ESBL NEGATIVE Sensitive     * >=100,000 COLONIES/mL ESCHERICHIA COLI  Blood culture (routine x 2)     Status: None   Collection Time: 01/10/19  1:30 AM  Result Value Ref Range  Status   Specimen Description BLOOD RIGHT ANTECUBITAL  Final   Special Requests   Final    BOTTLES DRAWN AEROBIC AND ANAEROBIC Blood Culture adequate volume   Culture   Final    NO GROWTH 5 DAYS Performed at [facility], [place] [address] [place], [place] [address]    Report Status 01/15/2019 FINAL  Final  Blood culture (routine x 2)     Status: None   Collection Time: 01/10/19  1:34 AM  Result Value Ref Range Status   Specimen Description BLOOD RIGHT FOREARM  Final   Special Requests   Final    BOTTLES DRAWN AEROBIC AND ANAEROBIC Blood Culture results may not be optimal due to an inadequate volume of blood received in culture bottles   Culture   Final    NO GROWTH 5 DAYS Performed at [facility], [place] [address] [place], [place] [address]    Report Status 01/15/2019 FINAL  Final  MRSA PCR Screening     Status: None   Collection Time: 01/14/19  1:01 PM  Result Value Ref Range Status   MRSA by PCR NEGATIVE NEGATIVE Final    Comment:        The GeneXpert MRSA Assay (FDA approved for NASAL specimens only), is one component of a comprehensive MRSA colonization surveillance program. It is not intended to diagnose MRSA infection nor to guide or monitor treatment for MRSA infections. Performed at [facility], [place] [address] [place], [place] [address]     Antimicrobials:  Cefepime and Vancomycin. Was on Azithromycin and Rocephin    Objective   Blood pressure (!) 97/46, pulse 87, temperature 97.9 F (36.6 C), temperature source Axillary, resp. rate (!) 33, height 5\' 7"  (1.702 m), weight 75.8 kg, SpO2 98 %.    FiO2 (%):  [50 %] 50 %   Intake/Output Summary (Last 24 hours) at 01/15/2019 1432 Last data filed at 01/14/2019 1700 Gross per 24 hour  Intake 339.6 ml  Output 400 ml  Net -60.4 ml   Filed Weights   01/10/19 0601 01/12/19 0335 01/14/19 0500  Weight:  74.2 kg 70.1 kg 75.8 kg    Examination: General: Elderly, non-toxic, ill, nad except for mild  tachypnea HENT: No jvd. On BiPap, Pupils equal and reactive. No icterus Lungs: Rhonchi bilaterally CC Ultrasound and Basic Echo: LV walls "kissing" with obliteration of cavity. Significant RV dilation and dysfunction. Short axis view shows probable dynamic LVOT obstruction. [place] of lungs show significant "B" lines of right lung consistent with interstitial edema Cardiovascular: Tachycardic, irregular, Pacemaker RU chest Abdomen: Non-distended, non-tender, NABS Extremities: Mild edema Neuro: Lethargic, following commands. No focal deficits Skin: dry, decreased turgor   Assessment & Plan:  1. Acute hypoxic respiratory failure 2. Hypovolemic hypotension 3 Acute pulmonary interstitial edema secondary to dynamic LVOT obstruction secondary to 4. Dehydration 5. Significant pulmonary hypertension 6 Acute kidney injury with low sodium secondary to volume depletion 7 Atrial fibrillation 8. Questionable Right ventricular failure, chronic diastolic and systolic with moderate-severe Pulmonary hypertension 9. Abnormal lung findings-possible fluid, amiodarone 10. Coagulopathy-acquired, warfarin use  Plans: Admit to ICU Fluids and colloid When adequately resuscitated, start digoxin and sildenafil if CXR improves Steroids for possible amiodarone lung; hold amiodorone Follow CXR BiPap for now Full code Continue antibiotics Check for Influenza Repeat stat echo to confirm LV underfilling Follow INR  Discussed with cardiology  Best practice:  Diet: NPO Pain/Anxiety/Delirium protocol (if indicated): will monitor. No further ativan VAP protocol (if indicated): N/A DVT prophylaxis: Coagulopathy GI prophylaxis: N/A Glucose control: will follow Mobility: Limited Code Status: Full Family Communication: Lengthy discussion with son who is physician here. Given the rapid decline they have asked for active treatment with aggressive care. May need central line. Discussed prognosis and probability for  poor outcome but family would want to attempt to reverse Disposition: To CVICU  Labs   CBC: Recent Labs  Lab 01/06/2019 2351  WBC 8.2  NEUTROABS 5.5  HGB 11.7*  HCT 40.9  MCV 81.3  PLT 809    Basic Metabolic Panel: Recent Labs  Lab 01/12/2019 2351  01/11/19 0618 01/12/19 0619 01/13/19 0611 01/14/19 0605 01/15/19 0821  NA 139   < > 137 140 138 137 139  K 4.0   < > 4.0 3.7 3.5 3.5 4.0  CL 102   < > 105 104 105 104 103  CO2 25   < > 22 25 23 24 23   GLUCOSE 117*   < > 112* 103* 111* 101* 106*  BUN 46*   < > 43* 42* 40* 38* 43*  CREATININE 1.96*   < > 1.68* 1.53* 1.53* 1.57* 1.69*  CALCIUM 8.4*   < > 7.8* 7.7* 7.4* 7.5* 7.9*  MG 2.3  --   --   --   --   --   --    < > = values in this interval not displayed.   GFR: Estimated Creatinine Clearance: 22.1 mL/min (A) (by C-G formula based on SCr of 1.69 mg/dL (H)). Recent Labs  Lab 01/16/2019 2351  WBC 8.2    Liver Function Tests: Recent Labs  Lab 01/01/2019 2351  AST 20  ALT 22  ALKPHOS 175*  BILITOT 1.1  PROT 6.3*  ALBUMIN 2.3*   No results for input(s): LIPASE, AMYLASE in the last 168 hours. No results for input(s): AMMONIA in the last 168 hours.  ABG    Component Value Date/Time   PHART 7.400 01/14/2019 1158   PCO2ART 41.1 01/14/2019 1158   PO2ART 97.0 01/14/2019 1158   HCO3 25.0 01/14/2019 1158   O2SAT 95.2 01/14/2019 1158  Coagulation Profile: Recent Labs  Lab 01/10/19 0339 01/11/19 0618 01/12/19 0619 01/13/19 0611 01/14/19 0605  INR 2.59 2.94 2.81 2.70 2.54    Cardiac Enzymes: Recent Labs  Lab 12/31/2018 2351  TROPONINI <0.03    HbA1C: No results found for: HGBA1C  CBG: No results for input(s): GLUCAP in the last 168 hours.  Review of Systems:   Unable to obtain from patient. Family states relatively active but has lower extremity swelling which has limited her  Past Medical History  She,  has a past medical history of Chronic anticoagulation, Edema of lower extremity,  Hyperlipidemia, Hypertension, Hypothyroidism, PAF (paroxysmal atrial fibrillation) ([place]) (1998), S/P cardiac pacemaker procedure (Sept 2011), Tachy-brady syndrome [facility]), Tremor (05/08/2014), and Visual disturbance.   Surgical History    Past Surgical History:  Procedure Laterality Date  . CARDIOVERSION N/A 02/26/2014   Procedure: CARDIOVERSION;  Surgeon: Deboraha Sprang, MD;  Location: [facility] ENDOSCOPY;  Service: Cardiovascular;  Laterality: N/A;  . CARDIOVERSION N/A 03/19/2014   Procedure: CARDIOVERSION;  Surgeon: Saramarie Spark, MD;  Location: [facility] ENDOSCOPY;  Service: Cardiovascular;  Laterality: N/A;  . CARDIOVERSION N/A 06/17/2015   Procedure: CARDIOVERSION;  Surgeon: Emalia Spark, MD;  Location: [place];  Service: Cardiovascular;  Laterality: N/A;  . CATARACT EXTRACTION    . CHOLECYSTECTOMY  2005  . INSERT / REPLACE / REMOVE PACEMAKER  08/28/2010   implantaton  . US ECHOCARDIOGRAPHY  04/10/2010   EF 55-60%     Social History   reports that she has never smoked. She has never used smokeless tobacco. She reports current alcohol use of about 7.0 standard drinks of alcohol per week. She reports that she does not use drugs.   Family History   Her family history includes Alcohol abuse in her brother; Heart attack in her brother and mother; Pneumonia in her father.   Allergies No Known Allergies   Home Medications  Prior to Admission medications   Medication Sig Start Date End Date Taking? Authorizing Provider  alendronate (FOSAMAX) 70 MG tablet TAKE 1 TABLET BY MOUTH ONCE A WEEK WITH A FULL GLASS OF WATER ON AN EMPTY STOMACH Patient taking differently: Take 70 mg by mouth every Sunday. WITH A FULL GLASS OF WATER ON AN EMPTY STOMACH 10/26/18  Yes Deveshwar, Abel Presto, MD  amiodarone (PACERONE) 200 MG tablet Take 1 tablet (200 mg total) by mouth 2 (two) times daily. 12/27/18  Yes Fenton, Clint R, PA  Calcium Carbonate-Vitamin D (CALTRATE 600+D PO) Take 1 tablet by mouth at bedtime.    Yes  [provider]  CLINPRO 5000 1.1 % PSTE 1 application daily. As directed 10/25/18  Yes [provider]  furosemide (LASIX) 80 MG tablet Take 40-80 mg by mouth daily. Take 80 mg by mouth every other day, alternating with 40 mg   Yes [provider]  KLOR-CON M20 20 MEQ tablet Take 10-20 mEq by mouth See admin instructions. Take 20 mEq by mouth every other day, alternating with 10 mEq 03/04/17  Yes [provider]  metoprolol succinate (TOPROL-XL) 100 MG 24 hr tablet Take 1 tablet (100 mg total) by mouth 2 (two) times daily. 01/03/19  Yes Deboraha Sprang, MD  Polyethyl Glycol-Propyl Glycol (SYSTANE OP) Place 1 drop into both eyes at bedtime.    Yes [provider]  warfarin (COUMADIN) 2 MG tablet Take 1-2 mg by mouth See admin instructions. Take 2 mg by mouth at bedtime on Sun/Tues/Thurs/Sat and 1 mg on Mon/Wed/Fri 04/30/15  Yes [provider]     Critical care time: 90 minutes

## 2019-01-15 NOTE — Progress Notes (Signed)
Progress Note  Patient Name: Anne Macias Date of Encounter: 01/15/2019  Primary Cardiologist: Dr. Caryl Comes  Subjective   Had RRT this AM for respiratory distress. Placed on BiPap. Now s/p thoracentesis with 600 cc fluid removed. Spoke at length with family at beside. She received ativan this AM, and they report that she has been more lethargic since that time. Has bipap on, asking for ice intermittently.  Inpatient Medications    Scheduled Meds: . amiodarone  200 mg Oral BID  . levothyroxine  25 mcg Oral Q0600  . metoprolol succinate  100 mg Oral BID  . metoprolol tartrate  5 mg Intravenous Q6H   Continuous Infusions: . ceFEPime (MAXIPIME) IV Stopped (01/14/19 1355)  . vancomycin Stopped (01/14/19 1633)   PRN Meds: acetaminophen **OR** acetaminophen   Vital Signs    Vitals:   01/15/19 0441 01/15/19 0801 01/15/19 0855 01/15/19 1100  BP: 102/61 (!) 114/100 100/64 (!) 97/46  Pulse: 90 86  87  Resp: (!) 23 (!) 35  (!) 33  Temp: 97.7 F (36.5 C) 97.9 F (36.6 C)    TempSrc:  Axillary    SpO2: 90% 100%  98%  Weight:      Height:        Intake/Output Summary (Last 24 hours) at 01/15/2019 1204 Last data filed at 01/14/2019 1700 Gross per 24 hour  Intake 339.6 ml  Output 400 ml  Net -60.4 ml   Last 3 Weights 01/14/2019 01/12/2019 01/10/2019  Weight (lbs) 167 lb 1.7 oz 154 lb 8.7 oz 163 lb 9.3 oz  Weight (kg) 75.8 kg 70.1 kg 74.2 kg      Telemetry    Atrial fibrillation - Personally Reviewed  ECG    Atrial fib, occasionally paced - Personally Reviewed  Physical Exam   GEN: bipap on. Appears to wake intermittently, asking for ice, but then returns to sleep  Neck: No JVD appreciated sitting upright in bed (has prominent V wave from TR, but does not remain elevated above clavicle) Cardiac: irregularly irregular S1 and S2, no murmurs, rubs, or gallops.  Respiratory: on bipap, bilateral coarseness but much improved aeration on right side post thoracentesis. GI:  Soft, nontender, non-distended  MS: LE edema completely resolved, TED hose in place; No deformity. Neuro:  Nonfocal  Psych: sleepy but awakens and responds to voice.   Labs    Chemistry Recent Labs  Lab 01/23/2019 2351  01/13/19 0611 01/14/19 0605 01/15/19 0821  NA 139   < > 138 137 139  K 4.0   < > 3.5 3.5 4.0  CL 102   < > 105 104 103  CO2 25   < > 23 24 23   GLUCOSE 117*   < > 111* 101* 106*  BUN 46*   < > 40* 38* 43*  CREATININE 1.96*   < > 1.53* 1.57* 1.69*  CALCIUM 8.4*   < > 7.4* 7.5* 7.9*  PROT 6.3*  --   --   --   --   ALBUMIN 2.3*  --   --   --   --   AST 20  --   --   --   --   ALT 22  --   --   --   --   ALKPHOS 175*  --   --   --   --   BILITOT 1.1  --   --   --   --   GFRNONAA 22*   < > 29* 28*  26*  GFRAA 25*   < > 34* 33* 30*  ANIONGAP 12   < > 10 9 13    < > = values in this interval not displayed.     Hematology Recent Labs  Lab 01/01/2019 2351  WBC 8.2  RBC 5.03  HGB 11.7*  HCT 40.9  MCV 81.3  MCH 23.3*  MCHC 28.6*  RDW 16.4*  PLT 233    Cardiac Enzymes Recent Labs  Lab 01/26/2019 2351  TROPONINI <0.03   No results for input(s): TROPIPOC in the last 168 hours.   BNP Recent Labs  Lab 01/16/2019 2351 01/14/19 1404  BNP 982.2* 1,007.5*     DDimer No results for input(s): DDIMER in the last 168 hours.   Radiology    Dg Chest Port 1 View  Result Date: 01/15/2019 CLINICAL DATA:  Post LEFT thoracentesis. EXAM: PORTABLE CHEST 1 VIEW 9:29 a.m.: COMPARISON:  Portable chest x-ray earlier same day at 7:38 a.m. and previously. FINDINGS: No evidence of pneumothorax after LEFT thoracentesis. Resolution of the LEFT pleural effusion. Stable moderate-sized RIGHT pleural effusion. Slight improvement in the diffuse BILATERAL airspace opacity since earlier this morning. Heart mildly enlarged but stable. RIGHT subclavian dual lead transvenous pacemaker unchanged. IMPRESSION: 1. No pneumothorax after LEFT thoracentesis. Resolution of the LEFT pleural effusion.  2. Improving multifocal BILATERAL pneumonia. 3. Stable moderate-sized RIGHT pleural effusion. Electronically Signed   By: Evangeline Dakin M.D.   On: 01/15/2019 10:30   Dg Chest Port 1 View  Result Date: 01/15/2019 CLINICAL DATA:  Hypoxia EXAM: PORTABLE CHEST 1 VIEW COMPARISON:  January 14, 2019 chest radiograph and chest CT January 10, 2019 FINDINGS: There is airspace consolidation throughout much of the right lung as well as in the left mid lower lung zones. There are small pleural effusions bilaterally. There is mild cardiomegaly with pulmonary vascularity grossly normal. Pacemaker leads are attached to the right atrium and right ventricle. There is aortic atherosclerosis. No adenopathy. No bone lesions. IMPRESSION: Extensive airspace consolidation bilaterally, likely representing multifocal pneumonia. There may be a degree of superimposed alveolar edema. There are pleural effusions bilaterally. There is stable cardiac prominence with pacemaker leads attached to right atrium and right ventricle. Aortic Atherosclerosis (ICD10-I70.0). Electronically Signed   By: Lowella Grip III M.D.   On: 01/15/2019 08:06   Dg Chest Port 1 View  Result Date: 01/14/2019 CLINICAL DATA:  Shortness of breath. EXAM: PORTABLE CHEST 1 VIEW COMPARISON:  CT 01/10/2019.  Radiographs 01/11/2019. FINDINGS: 1042 hours. Right subclavian pacemaker leads are unchanged at the right atrium and right ventricular apex. The heart size and mediastinal contours are stable. There is aortic atherosclerosis. Moderate size bilateral pleural effusions have not significantly changed. However, there are worsening bilateral perihilar airspace opacities and poor definition of the pulmonary vasculature. No pneumothorax. The bones appear unchanged. IMPRESSION: 1. Worsening bilateral perihilar opacities could reflect edema or atypical pneumonia. 2. Underlying bilateral pleural effusions and bibasilar atelectasis have not significantly changed from  the recent prior studies. Electronically Signed   By: Richardean Sale M.D.   On: 01/14/2019 11:18   US Thoracentesis Asp Pleural Space W/img Guide  Result Date: 01/15/2019 INDICATION: Shortness of breath. Respiratory distress. Bilateral pleural effusions. Request therapeutic thoracentesis. EXAM: ULTRASOUND GUIDED LEFT THORACENTESIS MEDICATIONS: None. COMPLICATIONS: None immediate. PROCEDURE: An ultrasound guided thoracentesis was thoroughly discussed with the patient and questions answered. The benefits, risks, alternatives and complications were also discussed. The patient understands and wishes to proceed with the procedure. Written consent was obtained. Ultrasound was  performed to localize and mark an adequate pocket of fluid in the left chest. The area was then prepped and draped in the normal sterile fashion. 1% Lidocaine was used for local anesthesia. Under ultrasound guidance a 6 Fr Safe-T-Centesis catheter was introduced. Thoracentesis was performed. The catheter was removed and a dressing applied. FINDINGS: A total of approximately 600 mL of clear yellow fluid was removed. IMPRESSION: Successful ultrasound guided left thoracentesis yielding 600 mL of pleural fluid. Read by: Ascencion Dike PA-C Electronically Signed   By: Sandi Mariscal M.D.   On: 01/15/2019 10:24    Cardiac Studies   Echo 01/10/19  1. The left ventricle has normal systolic function of 62-95%. The cavity size was normal. Left ventricular diastology could not be evaluated secondary to atrial fibrillation.  2. The right ventricle has moderately reduced systolic function. The cavity was normal. There is no increase in right ventricular wall thickness. Right ventricular systolic pressure is moderately elevated with an estimated pressure of 51.4 mmHg.  3. Left atrial size was moderately dilated.  4. Right atrial size was mildly dilated.  5. The mitral valve is normal in structure. There is mild thickening. There is mild to moderate  mitral annular calcification present.  6. The tricuspid valve is normal in structure. Tricuspid valve regurgitation is moderate-severe.  7. The aortic valve is tricuspid There is mild thickening and mild calcification of the aortic valve. Aortic valve regurgitation is trivial by color flow Doppler.  8. The aortic root and ascending aorta are normal in size and structure.  9. The inferior vena cava was dilated in size with <50% respiratory variability.  Patient Profile     85 y.o. female with a hx of paroxysmal afib on amiodarone and coumadin, tachy-brady syndrome with permanent pacemaker, HTN, and hyperlipidemia who is being followed for atrial fibrillation.  Assessment & Plan    1. Paroxysmal atrial fibrillation -remains in afib, was on home amiodarone 200 mg BID and metoprolol succinate 100 mg BID. Decreasing metoprolol to 50 mg bid given borderline blood pressures. -see below re: coumadin. Will need 3 weeks (with documented weekly checks) of therapeutic INR on coumadin, but this can be moved back if INR needs reversed for procedure.  2. Acute on chronic diastolic heart failure Initially stable, but over the last two days she has decompensated.  -s/p bipap and thoracentesis of 600 cc fluid today - echo done 2/11, as above -ABG 2/15 with good oxygenation - I/O difficult, daily weights have been variable.  -post thoracentesis chest x ray shows no pneumothorax, resolution of left pleural effusion, stable moderate right pleural effusion. Slight improvement noted in bilateral airspace opacities compared to prior -blood pressures have been low. Will cut back metoprolol dose (as above) to attempt to give more blood pressure room, but need to monitor closely for tachycardia as this will worsen her perfusion  3. Tachycardia-bradycardia syndrome S/p PPM implanted in 2011.   3. CAP & UTI Managed by hospitalist team with ceftriaxone and azithromycin. As noted yesterday, would be cautious of  azithromycin use with amiodarone and risk of QTc prolongation.   4. AKI Cr worsened to 1.69 today; in the last year has ranged from 1.06-1.47. This is not far from where she is currently. Avoid nephrotoxic agents, continue to monitor.  We spoke for >40 minutes about the family's wishes. Son, who is a Psychiatric nurse, notes that they would be fine with ICU monitoring, central line, arterial line, continued bipap, etc. She was doing very well just  48 hours ago, so they do not wish to de-escalate care. I am not sure what triggered this dramatic change in her clinical status, but given her normal EF and improved volume status (to the point that she may even be slightly intravascularly depleted), I do not think this is all cardiac.  Given the difficult in balancing her volume status, need for bipap, I feel it would be appropriate to monitor in the ICU with lines for access. It is not an urgent transfer, but given her decline over the last 48 hours I would aim to do it before she takes another turn.   I paged Dr. Venetia Constable Ortix to communicate my recommendations. We will continue to follow.  TIME SPENT WITH PATIENT: >40 minutes of direct patient care. More than 50% of that time was spent on coordination of care and counseling regarding pleural effusions, heart failure, atrial fibrillation.  Buford Dresser, MD, PhD [facility]   For questions or updates, please contact [place] Please consult [email or web address] for contact info under     Signed, Buford Dresser, MD  01/15/2019, 12:04 PM

## 2019-01-15 NOTE — Procedures (Signed)
PROCEDURE SUMMARY:  Successful US guided left thoracentesis. Yielded 600 mL of clear yellow fluid. Pt tolerated procedure well. No immediate complications.  Specimen was not sent for labs. CXR ordered.  EBL < 5 mL  Ascencion Dike PA-C 01/15/2019 9:19 AM

## 2019-01-15 NOTE — Progress Notes (Signed)
HNV since arriving, bladder scan 105 ml has purewick on , explained purewick/external catheter to patient.

## 2019-01-16 ENCOUNTER — Inpatient Hospital Stay (HOSPITAL_COMMUNITY): Payer: Medicare Other

## 2019-01-16 DIAGNOSIS — I959 Hypotension, unspecified: Secondary | ICD-10-CM

## 2019-01-16 DIAGNOSIS — N390 Urinary tract infection, site not specified: Secondary | ICD-10-CM

## 2019-01-16 DIAGNOSIS — I9589 Other hypotension: Secondary | ICD-10-CM

## 2019-01-16 DIAGNOSIS — E861 Hypovolemia: Secondary | ICD-10-CM

## 2019-01-16 DIAGNOSIS — J189 Pneumonia, unspecified organism: Principal | ICD-10-CM

## 2019-01-16 LAB — POCT I-STAT 7, (LYTES, BLD GAS, ICA,H+H)
ACID-BASE DEFICIT: 5 mmol/L — AB (ref 0.0–2.0)
Bicarbonate: 20.6 mmol/L (ref 20.0–28.0)
CALCIUM ION: 1.07 mmol/L — AB (ref 1.15–1.40)
HCT: 42 % (ref 36.0–46.0)
Hemoglobin: 14.3 g/dL (ref 12.0–15.0)
O2 SAT: 100 %
PH ART: 7.349 — AB (ref 7.350–7.450)
Patient temperature: 96.7
Potassium: 4.5 mmol/L (ref 3.5–5.1)
Sodium: 137 mmol/L (ref 135–145)
TCO2: 22 mmol/L (ref 22–32)
pCO2 arterial: 37 mmHg (ref 32.0–48.0)
pO2, Arterial: 293 mmHg — ABNORMAL HIGH (ref 83.0–108.0)

## 2019-01-16 LAB — COMPREHENSIVE METABOLIC PANEL
ALT: 45 U/L — ABNORMAL HIGH (ref 0–44)
AST: 47 U/L — ABNORMAL HIGH (ref 15–41)
Albumin: 1.9 g/dL — ABNORMAL LOW (ref 3.5–5.0)
Alkaline Phosphatase: 132 U/L — ABNORMAL HIGH (ref 38–126)
Anion gap: 15 (ref 5–15)
BUN: 51 mg/dL — ABNORMAL HIGH (ref 8–23)
CO2: 19 mmol/L — ABNORMAL LOW (ref 22–32)
Calcium: 7.9 mg/dL — ABNORMAL LOW (ref 8.9–10.3)
Chloride: 106 mmol/L (ref 98–111)
Creatinine, Ser: 1.94 mg/dL — ABNORMAL HIGH (ref 0.44–1.00)
GFR, EST AFRICAN AMERICAN: 25 mL/min — AB (ref >60)
GFR, EST NON AFRICAN AMERICAN: 22 mL/min — AB (ref >60)
Glucose, Bld: 127 mg/dL — ABNORMAL HIGH (ref 70–99)
Potassium: 4.4 mmol/L (ref 3.5–5.1)
Sodium: 140 mmol/L (ref 135–145)
Total Bilirubin: 2.1 mg/dL — ABNORMAL HIGH (ref 0.3–1.2)
Total Protein: 5.6 g/dL — ABNORMAL LOW (ref 6.5–8.1)

## 2019-01-16 LAB — MAGNESIUM: Magnesium: 2.2 mg/dL (ref 1.7–2.4)

## 2019-01-16 LAB — BLOOD GAS, ARTERIAL
Acid-base deficit: 3.9 mmol/L — ABNORMAL HIGH (ref 0.0–2.0)
Bicarbonate: 20.2 mmol/L (ref 20.0–28.0)
DRAWN BY: 441351
Delivery systems: POSITIVE
Expiratory PAP: 6
FIO2: 60
Inspiratory PAP: 14
O2 Saturation: 96.9 %
Patient temperature: 96.1
RATE: 14 resp/min
pCO2 arterial: 32 mmHg (ref 32.0–48.0)
pH, Arterial: 7.409 (ref 7.350–7.450)
pO2, Arterial: 129 mmHg — ABNORMAL HIGH (ref 83.0–108.0)

## 2019-01-16 LAB — PHOSPHORUS: PHOSPHORUS: 4.1 mg/dL (ref 2.5–4.6)

## 2019-01-16 LAB — GLUCOSE, CAPILLARY: Glucose-Capillary: 120 mg/dL — ABNORMAL HIGH (ref 70–99)

## 2019-01-16 LAB — PROCALCITONIN: Procalcitonin: 0.71 ng/mL

## 2019-01-16 LAB — PROTIME-INR
INR: 2.66
Prothrombin Time: 27.9 seconds — ABNORMAL HIGH (ref 11.4–15.2)

## 2019-01-16 LAB — STREP PNEUMONIAE URINARY ANTIGEN: STREP PNEUMO URINARY ANTIGEN: NEGATIVE

## 2019-01-16 MED ORDER — MIDAZOLAM HCL 2 MG/2ML IJ SOLN
1.0000 mg | Freq: Once | INTRAMUSCULAR | Status: AC
  Administered 2019-01-16: 1 mg via INTRAVENOUS

## 2019-01-16 MED ORDER — DOCUSATE SODIUM 50 MG/5ML PO LIQD: 100.0000 mg | Freq: Two times a day (BID) | ORAL | Status: DC | PRN

## 2019-01-16 MED ORDER — MIDAZOLAM HCL 2 MG/2ML IJ SOLN
1.0000 mg | INTRAMUSCULAR | Status: DC | PRN
  Administered 2019-01-16: 1 mg via INTRAVENOUS

## 2019-01-16 MED ORDER — FENTANYL CITRATE (PF) 100 MCG/2ML IJ SOLN
50.0000 ug | Freq: Once | INTRAMUSCULAR | Status: AC
  Administered 2019-01-16: 50 ug via INTRAVENOUS
  Filled 2019-01-16: qty 2

## 2019-01-16 MED ORDER — SODIUM CHLORIDE 0.9 % IV SOLN
250.0000 mL | INTRAVENOUS | Status: DC
  Administered 2019-01-16: 500 mL via INTRAVENOUS

## 2019-01-16 MED ORDER — FENTANYL CITRATE (PF) 100 MCG/2ML IJ SOLN
INTRAMUSCULAR | Status: AC
  Administered 2019-01-16: 50 ug via INTRAVENOUS
  Filled 2019-01-16: qty 2

## 2019-01-16 MED ORDER — INSULIN ASPART 100 UNIT/ML ~~LOC~~ SOLN
0.0000 [IU] | SUBCUTANEOUS | Status: DC
  Administered 2019-01-16 – 2019-01-17 (×3): 1 [IU] via SUBCUTANEOUS
  Administered 2019-01-17: 2 [IU] via SUBCUTANEOUS
  Administered 2019-01-17: 1 [IU] via SUBCUTANEOUS

## 2019-01-16 MED ORDER — AMIODARONE HCL IN DEXTROSE 360-4.14 MG/200ML-% IV SOLN: 30.0000 mg/h | INTRAVENOUS | Status: DC

## 2019-01-16 MED ORDER — NOREPINEPHRINE 4 MG/250ML-% IV SOLN
2.0000 ug/min | INTRAVENOUS | Status: DC
  Administered 2019-01-16: 10 ug/min via INTRAVENOUS
  Administered 2019-01-16: 2 ug/min via INTRAVENOUS
  Filled 2019-01-16 (×2): qty 250

## 2019-01-16 MED ORDER — MIDAZOLAM HCL 2 MG/2ML IJ SOLN: 1.0000 mg | INTRAMUSCULAR | Status: DC | PRN

## 2019-01-16 MED ORDER — FENTANYL 2500MCG IN NS 250ML (10MCG/ML) PREMIX INFUSION
0.0000 ug/h | INTRAVENOUS | Status: DC
  Administered 2019-01-16: 50 ug/h via INTRAVENOUS
  Filled 2019-01-16 (×2): qty 250

## 2019-01-16 MED ORDER — CHLORHEXIDINE GLUCONATE 0.12% ORAL RINSE (MEDLINE KIT): 15.0000 mL | Freq: Two times a day (BID) | OROMUCOSAL | Status: DC

## 2019-01-16 MED ORDER — FENTANYL CITRATE (PF) 100 MCG/2ML IJ SOLN
50.0000 ug | INTRAMUSCULAR | Status: DC | PRN
  Administered 2019-01-16 (×2): 50 ug via INTRAVENOUS
  Filled 2019-01-16: qty 2

## 2019-01-16 MED ORDER — LIDOCAINE HCL 1 % IJ SOLN
INTRAMUSCULAR | Status: AC
  Filled 2019-01-16: qty 20

## 2019-01-16 MED ORDER — WHITE PETROLATUM EX OINT
TOPICAL_OINTMENT | CUTANEOUS | Status: AC
  Administered 2019-01-16: 13:00:00
  Filled 2019-01-16: qty 28.35

## 2019-01-16 MED ORDER — ORAL CARE MOUTH RINSE
15.0000 mL | OROMUCOSAL | Status: DC
  Administered 2019-01-16 – 2019-01-17 (×7): 15 mL via OROMUCOSAL

## 2019-01-16 MED ORDER — FENTANYL CITRATE (PF) 100 MCG/2ML IJ SOLN: 50.0000 ug | INTRAMUSCULAR | Status: DC | PRN

## 2019-01-16 MED ORDER — LACTATED RINGERS IV BOLUS
250.0000 mL | Freq: Once | INTRAVENOUS | Status: AC
  Administered 2019-01-16: 250 mL via INTRAVENOUS

## 2019-01-16 MED ORDER — ORAL CARE MOUTH RINSE: 15.0000 mL | OROMUCOSAL | Status: DC

## 2019-01-16 MED ORDER — CHLORHEXIDINE GLUCONATE 0.12% ORAL RINSE (MEDLINE KIT)
15.0000 mL | Freq: Two times a day (BID) | OROMUCOSAL | Status: DC
  Administered 2019-01-16 – 2019-01-17 (×2): 15 mL via OROMUCOSAL

## 2019-01-16 MED ORDER — NOREPINEPHRINE 4 MG/250ML-% IV SOLN
0.0000 ug/min | INTRAVENOUS | Status: DC
  Administered 2019-01-16: 17 ug/min via INTRAVENOUS
  Administered 2019-01-17 (×2): 16 ug/min via INTRAVENOUS
  Administered 2019-01-17: 12 ug/min via INTRAVENOUS
  Filled 2019-01-16: qty 500
  Filled 2019-01-16 (×2): qty 250

## 2019-01-16 MED ORDER — FAMOTIDINE 40 MG/5ML PO SUSR
20.0000 mg | Freq: Two times a day (BID) | ORAL | Status: DC
  Administered 2019-01-16 – 2019-01-17 (×2): 20 mg via ORAL
  Filled 2019-01-16 (×2): qty 2.5

## 2019-01-16 MED ORDER — MIDAZOLAM HCL 2 MG/2ML IJ SOLN
INTRAMUSCULAR | Status: AC
  Administered 2019-01-16: 1 mg via INTRAVENOUS
  Filled 2019-01-16: qty 2

## 2019-01-16 NOTE — Care Plan (Signed)
Discussed patient's worsening hypoxemia with family including husband Rosaria Ferries), daughter Shirlean Mylar) and son, Dr. Harlow Mares. Family expresses wish to give patient a chance to overcome this current condition and after speaking with patient directly, family agreed to reverse DNR status and expressed wish to trial support with mechanical ventilation and perform CPR if indicated.  Code status changed to Full Code.

## 2019-01-16 NOTE — Procedures (Signed)
Intubation Procedure Note Anne Macias [id number] 09/22/1923  Procedure: Intubation Indications: Respiratory insufficiency  Procedure Details Consent: Risks of procedure as well as the alternatives and risks of each were explained to the (patient/caregiver).  Consent for procedure obtained. Time Out: Verified patient identification, verified procedure, site/side was marked, verified correct patient position, special equipment/implants available, medications/allergies/relevent history reviewed, required imaging and test results available.  Performed  Maximum sterile technique was used including gloves, hand hygiene and mask.  MAC and 3 via Glidescope  Grade I view. ETT visualized passing between vocal cards. Color change noted. RT auscultated and confirmed bilateral breath sounds.  Evaluation Hemodynamic Status: BP stable throughout; O2 sats: transiently fell during during procedure Patient's Current Condition: stable, critical Complications: No apparent complications Patient did tolerate procedure well. Chest X-ray ordered to verify placement.  CXR: tube position low-repostitioned.   Anne Macias 01/16/2019

## 2019-01-16 NOTE — Progress Notes (Signed)
Increasing oxyegen demand, increasing levophed to have SBP 90. Patient only ocmplaint is wants water. Dr. Loanne Drilling notified.

## 2019-01-16 NOTE — Progress Notes (Addendum)
Progress Note  Patient Name: Anne Macias Date of Encounter: 01/16/2019  Primary Cardiologist: Dr. Caryl Comes  Subjective   Just taken off BiPAP; was on last night;  Now high flow at 25 and 80%  Inpatient Medications    Scheduled Meds: . levothyroxine  25 mcg Oral Q0600  . methylPREDNISolone (SOLU-MEDROL) injection  40 mg Intravenous Q12H  . metoprolol tartrate  5 mg Intravenous Q6H   Continuous Infusions: . ceFEPime (MAXIPIME) IV Stopped (01/15/19 1353)  . lactated ringers 75 mL/hr at 01/16/19 0600  . vancomycin Stopped (01/14/19 1633)   PRN Meds: acetaminophen **OR** acetaminophen   Vital Signs    Vitals:   01/16/19 0400 01/16/19 0500 01/16/19 0600 01/16/19 0729  BP: 103/82 (!) 88/62 95/64   Pulse: 94 92 91   Resp: (!) 22 17 13    Temp: (!) 96.1 F (35.6 C)   (!) 96.5 F (35.8 C)  TempSrc: Axillary   Axillary  SpO2: 100% 100% 99%   Weight:  73.4 kg    Height:        Intake/Output Summary (Last 24 hours) at 01/16/2019 0736 Last data filed at 01/16/2019 0600 Gross per 24 hour  Intake 1539.36 ml  Output -  Net 1539.36 ml    I/O since admission: I/O +1899  Filed Weights   01/12/19 0335 01/14/19 0500 01/16/19 0500  Weight: 70.1 kg 75.8 kg 73.4 kg    Telemetry    Atrial fib - Personally Reviewed  ECG    ECG (independently read by me): AF; RBBB; rate 89, with intermittent paced beats  Physical Exam   BP 95/64   Pulse 91   Temp (!) 96.5 F (35.8 C) (Axillary)   Resp 13   Ht 5\' 7"  (1.702 m)   Wt 73.4 kg   SpO2 99%   BMI 25.34 kg/m  General: drowsy; frail  Skin: normal turgor, no rashes, warm and dry HEENT: Normocephalic, atraumatic. Pupils equal round and reactive to light; sclera anicteric; extraocular muscles intact;  Nose without nasal septal hypertrophy Mouth/Parynx benign;  Neck: No JVD, no carotid bruits; normal carotid upstroke Lungs: decreased BS without wheezing Chest wall: without tenderness to palpitation Heart: PMI not  displaced, RRR, s1 s2 normal, 1/6 systolic murmur, no diastolic murmur, no rubs, gallops, thrills, or heaves Abdomen: soft, nontender; no hepatosplenomehaly, BS+; abdominal aorta nontender and not dilated by palpation. Back: no CVA tenderness Pulses 2+ Musculoskeletal:  full range of motion, normal strength, no joint deformities Extremities: 1+ edema LE; no clubbing cyanosis, Homan's sign negative  Neurologic: grossly nonfocal; Cranial nerves grossly wnl    Labs    Chemistry Recent Labs  Lab 01/18/2019 2351  01/15/19 0821 01/15/19 1615 01/16/19 0246  NA 139   < > 139 139 140  K 4.0   < > 4.0 3.8 4.4  CL 102   < > 103 102 106  CO2 25   < > 23 25 19*  GLUCOSE 117*   < > 106* 95 127*  BUN 46*   < > 43* 45* 51*  CREATININE 1.96*   < > 1.69* 1.78* 1.94*  CALCIUM 8.4*   < > 7.9* 7.7* 7.9*  PROT 6.3*  --   --  5.5* 5.6*  ALBUMIN 2.3*  --   --  1.6* 1.9*  AST 20  --   --  50* 47*  ALT 22  --   --  44 45*  ALKPHOS 175*  --   --  125 132*  BILITOT 1.1  --   --  1.9* 2.1*  GFRNONAA 22*   < > 26* 24* 22*  GFRAA 25*   < > 30* 28* 25*  ANIONGAP 12   < > 13 12 15    < > = values in this interval not displayed.     Hematology Recent Labs  Lab 01/20/2019 2351  WBC 8.2  RBC 5.03  HGB 11.7*  HCT 40.9  MCV 81.3  MCH 23.3*  MCHC 28.6*  RDW 16.4*  PLT 233    Cardiac Enzymes Recent Labs  Lab 01/14/2019 2351  TROPONINI <0.03   No results for input(s): TROPIPOC in the last 168 hours.   BNP Recent Labs  Lab 01/13/2019 2351 01/14/19 1404  BNP 982.2* 1,007.5*     DDimer No results for input(s): DDIMER in the last 168 hours.   Lipid Panel  No results found for: CHOL, TRIG, HDL, CHOLHDL, VLDL, LDLCALC, LDLDIRECT   Radiology    Dg Chest Port 1 View  Result Date: 01/16/2019 CLINICAL DATA:  85 year old female with shortness of breath and pulmonary edema. Subsequent encounter. EXAM: PORTABLE CHEST 1 VIEW COMPARISON:  01/15/2019 FINDINGS: Sequential pacemaker enters from the right  with leads unchanged in position. Mild cardiomegaly. Patchy asymmetric airspace disease similar to prior exam with minimal improvement aeration right upper lobe. This is suggestive of multifocal pneumonia although there may also be a component of pulmonary edema. No pneumothorax identified. Pleural effusions may be present sub pulmonic in position on the right. Calcified aorta. IMPRESSION: 1. Patchy asymmetric airspace disease similar to prior exam with minimal improvement aeration right upper lobe. This is suggestive of multifocal pneumonia although there may also be a component of pulmonary edema. 2. Mild cardiomegaly with sequential pacemaker in place. 3. Right-sided sub pulmonic effusion suspected. Small left-sided pleural effusion. 4.  Aortic Atherosclerosis (ICD10-I70.0). Electronically Signed   By: Genia Del M.D.   On: 01/16/2019 07:19   Dg Chest Port 1 View  Result Date: 01/15/2019 CLINICAL DATA:  Post LEFT thoracentesis. EXAM: PORTABLE CHEST 1 VIEW 9:29 a.m.: COMPARISON:  Portable chest x-ray earlier same day at 7:38 a.m. and previously. FINDINGS: No evidence of pneumothorax after LEFT thoracentesis. Resolution of the LEFT pleural effusion. Stable moderate-sized RIGHT pleural effusion. Slight improvement in the diffuse BILATERAL airspace opacity since earlier this morning. Heart mildly enlarged but stable. RIGHT subclavian dual lead transvenous pacemaker unchanged. IMPRESSION: 1. No pneumothorax after LEFT thoracentesis. Resolution of the LEFT pleural effusion. 2. Improving multifocal BILATERAL pneumonia. 3. Stable moderate-sized RIGHT pleural effusion. Electronically Signed   By: Evangeline Dakin M.D.   On: 01/15/2019 10:30   Dg Chest Port 1 View  Result Date: 01/15/2019 CLINICAL DATA:  Hypoxia EXAM: PORTABLE CHEST 1 VIEW COMPARISON:  January 14, 2019 chest radiograph and chest CT January 10, 2019 FINDINGS: There is airspace consolidation throughout much of the right lung as well as in the  left mid lower lung zones. There are small pleural effusions bilaterally. There is mild cardiomegaly with pulmonary vascularity grossly normal. Pacemaker leads are attached to the right atrium and right ventricle. There is aortic atherosclerosis. No adenopathy. No bone lesions. IMPRESSION: Extensive airspace consolidation bilaterally, likely representing multifocal pneumonia. There may be a degree of superimposed alveolar edema. There are pleural effusions bilaterally. There is stable cardiac prominence with pacemaker leads attached to right atrium and right ventricle. Aortic Atherosclerosis (ICD10-I70.0). Electronically Signed   By: Lowella Grip III M.D.   On: 01/15/2019 08:06   Dg Chest [facility]  1 View  Result Date: 01/14/2019 CLINICAL DATA:  Shortness of breath. EXAM: PORTABLE CHEST 1 VIEW COMPARISON:  CT 01/10/2019.  Radiographs 01/20/2019. FINDINGS: 1042 hours. Right subclavian pacemaker leads are unchanged at the right atrium and right ventricular apex. The heart size and mediastinal contours are stable. There is aortic atherosclerosis. Moderate size bilateral pleural effusions have not significantly changed. However, there are worsening bilateral perihilar airspace opacities and poor definition of the pulmonary vasculature. No pneumothorax. The bones appear unchanged. IMPRESSION: 1. Worsening bilateral perihilar opacities could reflect edema or atypical pneumonia. 2. Underlying bilateral pleural effusions and bibasilar atelectasis have not significantly changed from the recent prior studies. Electronically Signed   By: Richardean Sale M.D.   On: 01/14/2019 11:18   US Thoracentesis Asp Pleural Space W/img Guide  Result Date: 01/15/2019 INDICATION: Shortness of breath. Respiratory distress. Bilateral pleural effusions. Request therapeutic thoracentesis. EXAM: ULTRASOUND GUIDED LEFT THORACENTESIS MEDICATIONS: None. COMPLICATIONS: None immediate. PROCEDURE: An ultrasound guided thoracentesis was  thoroughly discussed with the patient and questions answered. The benefits, risks, alternatives and complications were also discussed. The patient understands and wishes to proceed with the procedure. Written consent was obtained. Ultrasound was performed to localize and mark an adequate pocket of fluid in the left chest. The area was then prepped and draped in the normal sterile fashion. 1% Lidocaine was used for local anesthesia. Under ultrasound guidance a 6 Fr Safe-T-Centesis catheter was introduced. Thoracentesis was performed. The catheter was removed and a dressing applied. FINDINGS: A total of approximately 600 mL of clear yellow fluid was removed. IMPRESSION: Successful ultrasound guided left thoracentesis yielding 600 mL of pleural fluid. Read by: Ascencion Dike PA-C Electronically Signed   By: Sandi Mariscal M.D.   On: 01/15/2019 10:24    Cardiac Studies    Echo 01/10/19 1. The left ventricle has normal systolic function of 89-37%. The cavity size was normal. Left ventricular diastology could not be evaluated secondary to atrial fibrillation. 2. The right ventricle has moderately reduced systolic function. The cavity was normal. There is no increase in right ventricular wall thickness. Right ventricular systolic pressure is moderately elevated with an estimated pressure of 51.4 mmHg. 3. Left atrial size was moderately dilated. 4. Right atrial size was mildly dilated. 5. The mitral valve is normal in structure. There is mild thickening. There is mild to moderate mitral annular calcification present. 6. The tricuspid valve is normal in structure. Tricuspid valve regurgitation is moderate-severe. 7. The aortic valve is tricuspid There is mild thickening and mild calcification of the aortic valve. Aortic valve regurgitation is trivial by color flow Doppler. 8. The aortic root and ascending aorta are normal in size and structure. 9. The inferior vena cava was dilated in size with <50%  respiratory variability.  Patient Profile     85 y.o. female with a hx of paroxysmal afib on amiodarone and coumadin, tachy-brady syndrome with permanent pacemaker, HTN, and hyperlipidemia who is being followed for atrial fibrillation.  Assessment & Plan    1. PAF: swallowing poor so oral meds on hold;  Now on metoprolol 5 mg q 6 h; amiodarone on hold Rate in the 80s; can consider switching to iv short term while npo.  If BP remains low consider changing metoprolol to 2.5 mg q4-6.  INR 2.66 today, coumadin was held for thoracentesis.  2. Acute on chronic diastolic heart failure;  Underwent thoracentesis yesterday 600 cc removed.  Now off BiPAP. BP in mid 34K systolically with MAP 65;  BNP 1007 on  2/15;  Will f/u  3. AKI:  Cr increased to 1.9; BUN 51 ;  May need to hold diuretic today  4. Permanent Pacemaker  5. CAP on antibiotics to have f/u procalcitonin today  6. Mild LFT elevation :     Signed, Troy Sine, MD, [facility] 01/16/2019, 7:36 AM

## 2019-01-16 NOTE — Progress Notes (Signed)
Intubated, tolerated well placed 14 f [place] sump orally . Tolerated procedure well. Medicating with fentanyl for sedation.  Procedure explained prior to doing.  Dr Loanne Drilling placed central line. BP stable on levophed. Urine output low, 20 ml/.hr Dr Loanne Drilling aware

## 2019-01-16 NOTE — Progress Notes (Addendum)
NAME:  Anne Macias, MRN:  [id number], DOB:  Dec 30, 1922, LOS: 6 ADMISSION DATE:  01/16/2019, CONSULTATION DATE:  2.16.2020 REFERRING MD: [facility]ospital[facility], CHIEF COMPLAINT:  Hypoxia, hypotension   Brief History   85 yo female admitted for fall and weakness with UTI. Treated appropriately, she was also diuresed. Has developed progressive dyspnea culminating in RRT and BiPap. Given ativan for agitation on bipap. Appears worse with diuresis. Recent initiation and/or increase in amiodarone as well  History of present illness   KEMYAH BUSER is an 85 y.o. female past medical history of paroxysmal atrial fibrillation on amiodarone and Coumadin. Status post pacemaker for Tachy-Brady disease. Originally presented to the hospital for evaluation of weakness and fall at home. Amiodarone started and increased about 12 days ago. Has had swelling in legs.  Past Medical History   Past Medical History:  Diagnosis Date  . Chronic anticoagulation   . Edema of lower extremity   . Hyperlipidemia   . Hypertension   . Hypothyroidism   . PAF (paroxysmal atrial fibrillation) ([place]) 1998  . S/P cardiac pacemaker procedure Sept 5366   complicated by pocket hematoma  . Tachy-brady syndrome ([place])   . Tremor 05/08/2014  . Visual disturbance    Past Surgical History:  Procedure Laterality Date  . CARDIOVERSION N/A 02/26/2014   Procedure: CARDIOVERSION;  Surgeon: Deboraha Sprang, MD;  Location: [facility] ENDOSCOPY;  Service: Cardiovascular;  Laterality: N/A;  . CARDIOVERSION N/A 03/19/2014   Procedure: CARDIOVERSION;  Surgeon: Geoffrey Spark, MD;  Location: [facility] ENDOSCOPY;  Service: Cardiovascular;  Laterality: N/A;  . CARDIOVERSION N/A 06/17/2015   Procedure: CARDIOVERSION;  Surgeon: Emony Spark, MD;  Location: [place];  Service: Cardiovascular;  Laterality: N/A;  . CATARACT EXTRACTION    . CHOLECYSTECTOMY  2005  . INSERT / REPLACE / REMOVE PACEMAKER  08/28/2010   implantaton  . US  ECHOCARDIOGRAPHY  04/10/2010   EF 55-60%     Significant Hospital Events   Remained on BiPAP overnight. Tolerated 4h HFNC with FIO2 80% for four hours this morning. Transitioned to HFNC again this morning and tolerating with FIO2 80%  Consults:  Cardiology Palliative care  Procedures:  Echocardiogram  Significant Diagnostic Tests:  Echocardiogram: 2.11.2020 IMPRESSIONS    1. The left ventricle has normal systolic function of 44-03%. The cavity size was normal. Left ventricular diastology could not be evaluated secondary to atrial fibrillation.  2. The right ventricle has moderately reduced systolic function. The cavity was normal. There is no increase in right ventricular wall thickness. Right ventricular systolic pressure is moderately elevated with an estimated pressure of 51.4 mmHg.  3. Left atrial size was moderately dilated.  4. Right atrial size was mildly dilated.  5. The mitral valve is normal in structure. There is mild thickening. There is mild to moderate mitral annular calcification present.  6. The tricuspid valve is normal in structure. Tricuspid valve regurgitation is moderate-severe.  7. The aortic valve is tricuspid There is mild thickening and mild calcification of the aortic valve. Aortic valve regurgitation is trivial by color flow Doppler.  8. The aortic root and ascending aorta are normal in size and structure.  9. The inferior vena cava was dilated in size with <50% respiratory variability.  FINDINGS  Left Ventricle: The left ventricle has normal systolic function of 47-42%. The cavity size was normal. There is no increase in left ventricular wall thickness. Left ventricular diastology could not be evaluated secondary to atrial fibrillation. Right Ventricle: The right ventricle  has moderately reduced systolic function. The cavity was normal. There is no increase in right ventricular wall thickness. Right ventricular systolic pressure is moderately elevated  with an estimated pressure of 51.4  mmHg. Pacing wire/catheter visualized in the right ventricle. Left Atrium: left atrial size was moderately dilated Right Atrium: right atrial size was mildly dilated Interatrial Septum: No atrial level shunt detected by color flow Doppler. Pericardium: There is no evidence of pericardial effusion. There is pleural effusion in both left and right lateral regions. Mitral Valve: The mitral valve is normal in structure. There is mild thickening. There is mild to moderate mitral annular calcification present. Mitral valve regurgitation is mild by color flow Doppler. Tricuspid Valve: The tricuspid valve is normal in structure. Tricuspid valve regurgitation is moderate-severe by color flow Doppler. Aortic Valve: The aortic valve is tricuspid There is mild thickening and mild calcification of the aortic valve. Aortic valve regurgitation is trivial by color flow Doppler. Pulmonic Valve: The pulmonic valve was not well visualized. The pulmonic valve was grossly normal. Pulmonic valve regurgitation is not visualized by color flow Doppler. Aorta: The aortic root and ascending aorta are normal in size and structure. Venous: The inferior vena cava is dilated in size with less than 50% respiratory variability.   LEFT VENTRICLE PLAX 2D (Teich) LV EF:          68.4 % LVIDd:          3.53 cm LVIDs:          2.21 cm LV PW:          0.94 cm LV IVS:         0.91 cm LVOT diam:      1.70 cm LV SV:          36 ml LVOT Area:      2.27 cm  RIGHT VENTRICLE RV S prime:     7.18 cm/s TAPSE (M-mode): 0.4 cm RVSP:           51.4 mmHg  LEFT ATRIUM             Index       RIGHT ATRIUM           Index LA diam:        4.50 cm 2.42 cm/m  RA Pressure: 15 mmHg LA Vol (A2C):   64.3 ml 34.63 ml/m RA Area:     23.70 cm LA Vol (A4C):   47.3 ml 25.48 ml/m RA Volume:   72.20 ml  38.89 ml/m LA Biplane Vol: 60.2 ml 32.42 ml/m  AORTIC VALVE LVOT Vmax:   66.30 cm/s LVOT Vmean:   49.000 cm/s LVOT VTI:    0.119 m   AORTA Ao Root diam: 2.90 cm  MITRAL VALVE              TR Peak grad: 36.4 mmHg MV Area (PHT): 5.54 cm   TR Vmax:      336.00 cm/s MV PHT:        39.73 msec RVSP:         51.4 mmHg MV Decel Time: 137 msec MV E velocity: 87.97 cm/s  IMPRESSION: Bilateral pleural effusions, right larger than left. Compressive atelectasis in the lower lobes.  Ground-glass opacities in both upper lobes, left greater than right could reflect pneumonia.  Cardiomegaly, coronary artery disease.  Aortic Atherosclerosis  IMPRESSION: 1. No hydronephrosis or other acute findings. 2. Mild bilateral renal atrophy. Micro Data:  UCX +E.Coli >100K, pan-sensitive BCx 2/11 NGTD  Antimicrobials:  Cefepime 2/15> Vancomycin 2/15> Azithromycin 2/14>OTO Rocephin 2/10, 2/12>2/14   Objective   Blood pressure 95/64, pulse 91, temperature (!) 96.5 F (35.8 C), temperature source Axillary, resp. rate 13, height 5\' 7"  (1.702 m), weight 73.4 kg, SpO2 99 %.    FiO2 (%):  [50 %-80 %] 70 %   Intake/Output Summary (Last 24 hours) at 01/16/2019 0747 Last data filed at 01/16/2019 0600 Gross per 24 hour  Intake 1539.36 ml  Output -  Net 1539.36 ml   Filed Weights   01/12/19 0335 01/14/19 0500 01/16/19 0500  Weight: 70.1 kg 75.8 kg 73.4 kg   Physical Exam: General: Frail, critically ill appearing female HENT: [place], AT, BiPAP on face Eyes: EOMI, no scleral icterus Respiratory: Diminished breath sounds bilaterally Cardiovascular: RRR, -M/R/G, no JVD GI: BS+, soft, nontender Extremities:2+ pitting edema in lower extremities, dusky fingernails Neuro: Drowsy, awakens to voice, follows commands Skin: Intact, no rashes or bruising GU: [place] in place  Assessment & Plan:   AHRF S/p thoracentesis of left effusion on 2/16. CXR 2/17 personally reviewed by me with patchy infiltrates including in RUL concerning for pneumonia and mild pulmonary edema with small effusions. Trial  off BiPAP to HFNC. Wean as tolerated. Maintain O2 saturations >88% ABG as needed. ABG this am appropriate Broadened to Vanc/Zosyn. Follow-up procal Continue steroids Order blood cultures, urine strep and legionella  Acute encephalopathy - improved s/p ativan on floor. Mental status appropriate this am  Hypovolemic Hypotension DC continuous IVF Monitor pressure Gentle bolus as needed  PH Echo with RVSP 51.4 Hold on diuresis due to borderline pressure and AKI  Tachy-brady s/p PM Paroxysmal Atrial fibrillation Cardiology following Hold amio. Family expresses concern that this may be contributing to her respiratory issues however unlikely to cause acute issues. Scheduled IV metoprolol Holding coumadin for now INR daily. Will restart heparin when INR <1.8  AKI Secondary to volume depletion BMP daily Insert foley  Best practice:  Diet: NPO Pain/Anxiety/Delirium protocol (if indicated): will monitor. No further ativan VAP protocol (if indicated): N/A DVT prophylaxis: Hold anticoagulation for now GI prophylaxis: N/A Glucose control: CBG q4h Mobility: Limited Code Status: DNR Family Communication: No family at bedside Disposition: Remain in CVICU  Labs   CBC: Recent Labs  Lab 01/16/2019 2351  WBC 8.2  NEUTROABS 5.5  HGB 11.7*  HCT 40.9  MCV 81.3  PLT 841    Basic Metabolic Panel: Recent Labs  Lab 01/07/2019 2351  01/13/19 0611 01/14/19 0605 01/15/19 0821 01/15/19 1615 01/16/19 0246  NA 139   < > 138 137 139 139 140  K 4.0   < > 3.5 3.5 4.0 3.8 4.4  CL 102   < > 105 104 103 102 106  CO2 25   < > 23 24 23 25  19*  GLUCOSE 117*   < > 111* 101* 106* 95 127*  BUN 46*   < > 40* 38* 43* 45* 51*  CREATININE 1.96*   < > 1.53* 1.57* 1.69* 1.78* 1.94*  CALCIUM 8.4*   < > 7.4* 7.5* 7.9* 7.7* 7.9*  MG 2.3  --   --   --   --  2.1 2.2  PHOS  --   --   --   --   --  3.3 4.1   < > = values in this interval not displayed.   GFR: Estimated Creatinine Clearance: 17.6  mL/min (A) (by C-G formula based on SCr of 1.94 mg/dL (H)). Recent Labs  Lab 12/31/2018 2351  01/15/19 1615 01/15/19 1917 01/16/19 0246  PROCALCITON  --  0.60  --  0.71  WBC 8.2  --   --   --   LATICACIDVEN  --  2.4* 2.1*  --     Liver Function Tests: Recent Labs  Lab 12/31/2018 2351 01/15/19 1615 01/16/19 0246  AST 20 50* 47*  ALT 22 44 45*  ALKPHOS 175* 125 132*  BILITOT 1.1 1.9* 2.1*  PROT 6.3* 5.5* 5.6*  ALBUMIN 2.3* 1.6* 1.9*   No results for input(s): LIPASE, AMYLASE in the last 168 hours. Recent Labs  Lab 01/15/19 1615  AMMONIA 22    ABG    Component Value Date/Time   PHART 7.409 01/16/2019 0500   PCO2ART 32.0 01/16/2019 0500   PO2ART 129 (H) 01/16/2019 0500   HCO3 20.2 01/16/2019 0500   ACIDBASEDEF 3.9 (H) 01/16/2019 0500   O2SAT 96.9 01/16/2019 0500     Coagulation Profile: Recent Labs  Lab 01/12/19 0619 01/13/19 0611 01/14/19 0605 01/15/19 1615 01/16/19 0246  INR 2.81 2.70 2.54 2.50 2.66    Cardiac Enzymes: Recent Labs  Lab 12/31/2018 2351  TROPONINI <0.03    HbA1C: No results found for: HGBA1C  CBG: No results for input(s): GLUCAP in the last 168 hours.  Critical care time: 35 min  The patient is critically ill with multiple organ systems failure and requires high complexity decision making for assessment and support, frequent evaluation and titration of therapies, application of advanced monitoring technologies and extensive interpretation of multiple databases.   Critical Care Time devoted to patient care services described in this note is 35 Minutes. This time reflects time of care of this signee Dr. Rodman Pickle. This critical care time does not reflect procedure time, or teaching time or supervisory time of PA/NP/Med student/Med Resident etc but could involve care discussion time.  Rodman Pickle, M.D. [facility] Pulmonary/Critical Care Medicine Pager: [phone] After hours pager: [phone]

## 2019-01-16 NOTE — Progress Notes (Signed)
[place] Progress Note Patient Name: Anne Macias DOB: [birth year] MRN: [id number]   Date of Service  01/16/2019  HPI/Events of Note  Hyperglycemia - Blood glucose = 127. Request for Novolog SSI.   eICU Interventions  Will order: 1. Q 4 hour sensitive Novolog SSI.      Intervention Category Major Interventions: Hyperglycemia - active titration of insulin therapy  Lysle Dingwall 01/16/2019, 9:06 PM

## 2019-01-16 NOTE — Progress Notes (Addendum)
Pt placed on Bipap by RN due to desat on HFNC.

## 2019-01-16 NOTE — Progress Notes (Signed)
PT Cancellation Note  Patient Details Name: Anne Macias MRN: [id number] DOB: 04/19/1923   Cancelled Treatment:    Reason Eval/Treat Not Completed: Medical issues which prohibited therapy. Pt with medical decline over the weekend and now in ICU on bipap. Will follow for appropriateness for further PT.   Shary Decamp [facility] 01/16/2019, 2:38 PM  [place] Guastella [place] Pager [phone] Office [phone]

## 2019-01-16 NOTE — Procedures (Signed)
Central Venous Catheter Insertion Procedure Note Anne Macias [id number] Nov 20, 1923  Procedure: Insertion of Central Venous Catheter Indications: Drug and/or fluid administration  Procedure Details Consent: Risks of procedure as well as the alternatives and risks of each were explained to the (patient/caregiver).  Consent for procedure obtained. Time Out: Verified patient identification, verified procedure, site/side was marked, verified correct patient position, special equipment/implants available, medications/allergies/relevent history reviewed, required imaging and test results available.  Performed  Maximum sterile technique was used including antiseptics, cap, gloves, gown, hand hygiene, mask and sheet. Skin prep: Chlorhexidine; local anesthetic administered A antimicrobial bonded/coated triple lumen catheter was placed in the left internal jugular vein using the Seldinger technique under ultrasound.  Evaluation Blood flow good Complications: No apparent complications Patient did tolerate procedure well. Chest X-ray ordered to verify placement.  CXR: normal.  Anne Macias 01/16/2019, 8:19 PM

## 2019-01-17 ENCOUNTER — Inpatient Hospital Stay (HOSPITAL_COMMUNITY): Payer: Medicare Other

## 2019-01-17 DIAGNOSIS — A419 Sepsis, unspecified organism: Secondary | ICD-10-CM

## 2019-01-17 DIAGNOSIS — Z7189 Other specified counseling: Secondary | ICD-10-CM

## 2019-01-17 DIAGNOSIS — R6521 Severe sepsis with septic shock: Secondary | ICD-10-CM

## 2019-01-17 LAB — CBC
HCT: 43.7 % (ref 36.0–46.0)
Hemoglobin: 12.3 g/dL (ref 12.0–15.0)
MCH: 23.1 pg — AB (ref 26.0–34.0)
MCHC: 28.1 g/dL — AB (ref 30.0–36.0)
MCV: 82.1 fL (ref 80.0–100.0)
Platelets: 333 10*3/uL (ref 150–400)
RBC: 5.32 MIL/uL — ABNORMAL HIGH (ref 3.87–5.11)
RDW: 17.8 % — ABNORMAL HIGH (ref 11.5–15.5)
WBC: 13.4 10*3/uL — ABNORMAL HIGH (ref 4.0–10.5)
nRBC: 1.5 % — ABNORMAL HIGH (ref 0.0–0.2)

## 2019-01-17 LAB — LEGIONELLA PNEUMOPHILA SEROGP 1 UR AG: L. PNEUMOPHILA SEROGP 1 UR AG: NEGATIVE

## 2019-01-17 LAB — DIC (DISSEMINATED INTRAVASCULAR COAGULATION)PANEL
D-Dimer, Quant: 2.07 ug/mL-FEU — ABNORMAL HIGH (ref 0.00–0.50)
Fibrinogen: 788 mg/dL — ABNORMAL HIGH (ref 210–475)
INR: 3.46
Prothrombin Time: 34.3 seconds — ABNORMAL HIGH (ref 11.4–15.2)
Smear Review: NONE SEEN

## 2019-01-17 LAB — COMPREHENSIVE METABOLIC PANEL
ALT: 52 U/L — ABNORMAL HIGH (ref 0–44)
AST: 61 U/L — ABNORMAL HIGH (ref 15–41)
Albumin: 1.7 g/dL — ABNORMAL LOW (ref 3.5–5.0)
Alkaline Phosphatase: 167 U/L — ABNORMAL HIGH (ref 38–126)
Anion gap: 14 (ref 5–15)
BUN: 68 mg/dL — AB (ref 8–23)
CO2: 19 mmol/L — AB (ref 22–32)
Calcium: 7.6 mg/dL — ABNORMAL LOW (ref 8.9–10.3)
Chloride: 107 mmol/L (ref 98–111)
Creatinine, Ser: 2.43 mg/dL — ABNORMAL HIGH (ref 0.44–1.00)
GFR calc Af Amer: 19 mL/min — ABNORMAL LOW (ref >60)
GFR calc non Af Amer: 17 mL/min — ABNORMAL LOW (ref >60)
Glucose, Bld: 157 mg/dL — ABNORMAL HIGH (ref 70–99)
Potassium: 4.9 mmol/L (ref 3.5–5.1)
Sodium: 140 mmol/L (ref 135–145)
Total Bilirubin: 1.7 mg/dL — ABNORMAL HIGH (ref 0.3–1.2)
Total Protein: 5.5 g/dL — ABNORMAL LOW (ref 6.5–8.1)

## 2019-01-17 LAB — PROCALCITONIN: Procalcitonin: 0.86 ng/mL

## 2019-01-17 LAB — GLUCOSE, CAPILLARY
Glucose-Capillary: 123 mg/dL — ABNORMAL HIGH (ref 70–99)
Glucose-Capillary: 137 mg/dL — ABNORMAL HIGH (ref 70–99)
Glucose-Capillary: 137 mg/dL — ABNORMAL HIGH (ref 70–99)
Glucose-Capillary: 123 mg/dL — ABNORMAL HIGH (ref 70–99)
Glucose-Capillary: 152 mg/dL — ABNORMAL HIGH (ref 70–99)

## 2019-01-17 LAB — DIC (DISSEMINATED INTRAVASCULAR COAGULATION) PANEL
APTT: 38 s — AB (ref 24–36)
PLATELETS: 324 10*3/uL (ref 150–400)

## 2019-01-17 LAB — MAGNESIUM: Magnesium: 2.3 mg/dL (ref 1.7–2.4)

## 2019-01-17 LAB — PHOSPHORUS: Phosphorus: 6.1 mg/dL — ABNORMAL HIGH (ref 2.5–4.6)

## 2019-01-17 LAB — PROTIME-INR
INR: 3.19
Prothrombin Time: 32.2 seconds — ABNORMAL HIGH (ref 11.4–15.2)

## 2019-01-17 LAB — BRAIN NATRIURETIC PEPTIDE: B Natriuretic Peptide: 1074.6 pg/mL — ABNORMAL HIGH (ref 0.0–100.0)

## 2019-01-17 MED ORDER — METOPROLOL TARTRATE 5 MG/5ML IV SOLN: 2.5000 mg | Freq: Four times a day (QID) | INTRAVENOUS | Status: DC

## 2019-01-17 MED ORDER — BIOTENE DRY MOUTH MT LIQD: 15.0000 mL | OROMUCOSAL | Status: DC | PRN

## 2019-01-17 MED ORDER — SODIUM CHLORIDE 0.9% FLUSH: 10.0000 mL | INTRAVENOUS | Status: DC | PRN

## 2019-01-17 MED ORDER — CHLORHEXIDINE GLUCONATE CLOTH 2 % EX PADS
6.0000 | MEDICATED_PAD | Freq: Every day | CUTANEOUS | Status: DC
  Administered 2019-01-17: 6 via TOPICAL

## 2019-01-17 MED ORDER — SODIUM CHLORIDE 0.9% FLUSH
10.0000 mL | Freq: Two times a day (BID) | INTRAVENOUS | Status: DC
  Administered 2019-01-17 (×2): 10 mL

## 2019-01-17 MED ORDER — LORAZEPAM 2 MG/ML IJ SOLN: 0.5000 mg | INTRAMUSCULAR | Status: DC | PRN

## 2019-01-17 MED ORDER — VITAL AF 1.2 CAL PO LIQD: 1000.0000 mL | ORAL | Status: DC

## 2019-01-17 MED ORDER — VASOPRESSIN 20 UNIT/ML IV SOLN
0.0300 [IU]/min | INTRAVENOUS | Status: DC
  Administered 2019-01-17: 0.03 [IU]/min via INTRAVENOUS
  Filled 2019-01-17: qty 2

## 2019-01-17 MED ORDER — GLYCOPYRROLATE 0.2 MG/ML IJ SOLN: 0.4000 mg | Freq: Three times a day (TID) | INTRAMUSCULAR | Status: DC

## 2019-01-17 MED ORDER — JEVITY 1.2 CAL PO LIQD: 1000.0000 mL | ORAL | Status: DC

## 2019-01-17 MED ORDER — POLYVINYL ALCOHOL 1.4 % OP SOLN
1.0000 [drp] | Freq: Four times a day (QID) | OPHTHALMIC | Status: DC | PRN
  Filled 2019-01-17: qty 15

## 2019-01-17 MED ORDER — AMIODARONE HCL IN DEXTROSE 360-4.14 MG/200ML-% IV SOLN
30.0000 mg/h | INTRAVENOUS | Status: DC
  Administered 2019-01-17: 30 mg/h via INTRAVENOUS
  Filled 2019-01-17: qty 200

## 2019-01-17 MED ORDER — SODIUM CHLORIDE 0.9 % IV BOLUS
1000.0000 mL | Freq: Once | INTRAVENOUS | Status: AC
  Administered 2019-01-17: 1000 mL via INTRAVENOUS

## 2019-01-18 ENCOUNTER — Telehealth: Payer: Self-pay | Admitting: Pulmonary Disease

## 2019-01-18 NOTE — Telephone Encounter (Incomplete)
01/18/2019 Received D/C from [facility] will send to Dr. Loanne Drilling to Sign. PWR  01/20/2019 received signed D/C back from dr. Loanne Drilling will call forbis * Dick to pick up PWR.

## 2019-01-19 DIAGNOSIS — A419 Sepsis, unspecified organism: Secondary | ICD-10-CM

## 2019-01-19 DIAGNOSIS — R791 Abnormal coagulation profile: Secondary | ICD-10-CM

## 2019-01-19 DIAGNOSIS — R6521 Severe sepsis with septic shock: Secondary | ICD-10-CM

## 2019-01-19 NOTE — Telephone Encounter (Signed)
Waiting to received death certificate from medical records if necessary. -pr

## 2019-01-19 NOTE — Telephone Encounter (Signed)
Heather from [employer] and Barbarann Ehlers want's to know if Dr. Loanne Drilling signed the death certificate  [phone]            [phone]

## 2019-01-20 ENCOUNTER — Other Ambulatory Visit (HOSPITAL_COMMUNITY): Payer: Self-pay | Admitting: Physician Assistant

## 2019-01-21 LAB — CULTURE, BLOOD (ROUTINE X 2)
Culture: NO GROWTH
Culture: NO GROWTH
SPECIAL REQUESTS: ADEQUATE

## 2019-01-23 NOTE — Telephone Encounter (Signed)
Help me remember to call her son and dusband tomorrow

## 2019-01-29 NOTE — Progress Notes (Signed)
Upon 0000 assessment, severe mottling of bilateral toes noted. Pedal pulses found by doppler. Continued BLE edema noted. Contacted [place] on-call RN & MD to assess & give new orders.   VSS- will continue to monitor.

## 2019-01-29 NOTE — Consult Note (Signed)
Consultation Note Date: February 05, 2019   Patient Name: Anne Macias  DOB: 05/19/1923  MRN: [id number]  Age / Sex: 85 y.o., female  PCP: Lavone Orn, MD Referring Physician: Margaretha Seeds, MD  Reason for Consultation: Terminal Care  HPI/Patient Profile: 85 y.o. female  with past medical history of atrial fib (on amiodarone and Coumadin), tachy-brady syndrome s/p pacemaker placement, HTN, HLD admitted from home on 01/16/2019 with fall. Diagnosed with pneumonia, pleural effusion, and heart failure with septic shock and her respiratory status worsened during hospitalization and family opted for a trial of intubation. After intubation she continued to decline with renal failure and mottling to lower extremities. Family has opted not to pursue continued aggressive care or dialysis and proceed with comfort care.   Clinical Assessment and Goals of Care: I met today at bedside of Anne Macias with husband, son, daughter, and multiple family members. They have just had discussion with Dr. Loanne Drilling and have opted for comfort care and one way extubation. Son, Dr. Harlow Mares, tells me that he had a conversation with his mother 2 days ago in which she had expressed desire to try aggressive care but now with renal failure they all understand that she is declining and at EOL. They would like to avoid continuous infusion of opioid (she is only on fentanyl 10 mcg/hr anyway) to provide her with any opportunity for alertness if possible. They understand that this may not be possible. They agree with fentanyl bolus prior to extubation to assist with the transition and as needed to provide relief of pain, SOB, and any other discomforts.   I called chaplain to bedside at their request and was present as she was extubated. She did not live long after extubation and I pronounced with Anne Macias with no heart sounds or respirations and  asystole at 1441. Emotional support provided to family.   Primary Decision Maker Husband, son, and daughter making decisions together    SUMMARY OF RECOMMENDATIONS   - Full comfort measures with one way extubation  Code Status/Advance Care Planning:  DNR   Symptom Management:   PRN medications added to alleviate symptoms at EOL  Palliative Prophylaxis:   Frequent Pain Assessment, Oral Care and Turn Reposition  Additional Recommendations (Limitations, Scope, Preferences):  Full Comfort Care  Psycho-social/Spiritual:   Desire for further Chaplaincy support:yes  Additional Recommendations: Grief/Bereavement Support  Prognosis:   Passed during my visit.      Primary Diagnoses: Present on Admission: . Fall . UTI (urinary tract infection) . CAP (community acquired pneumonia) . Pleural effusion . CKD (chronic kidney disease) stage 3, GFR 30-59 ml/min (HCC) . Hypothyroidism . Acute kidney injury superimposed on CKD ([place]) . Atrial fibrillation, persistent   I have reviewed the medical record, interviewed the patient and family, and examined the patient. The following aspects are pertinent.  Past Medical History:  Diagnosis Date  . Chronic anticoagulation   . Edema of lower extremity   . Hyperlipidemia   . Hypertension   . Hypothyroidism   . PAF (  paroxysmal atrial fibrillation) ([place]) 1998  . S/P cardiac pacemaker procedure Sept 7017   complicated by pocket hematoma  . Tachy-brady syndrome ([place])   . Tremor 05/08/2014  . Visual disturbance    Social History   Socioeconomic History  . Marital status: Married    Spouse name: Not on file  . Number of children: Not on file  . Years of education: Not on file  . Highest education level: Not on file  Occupational History  . Not on file  Social Needs  . Financial resource strain: Not on file  . Food insecurity:    Worry: Not on file    Inability: Not on file  . Transportation needs:    Medical: Not on file      Non-medical: Not on file  Tobacco Use  . Smoking status: Never Smoker  . Smokeless tobacco: Never Used  Substance and Sexual Activity  . Alcohol use: Yes    Alcohol/week: 7.0 standard drinks    Types: 7 Glasses of wine per week    Comment: 5 oz red wine daily at dinner  . Drug use: Never  . Sexual activity: Not on file  Lifestyle  . Physical activity:    Days per week: Not on file    Minutes per session: Not on file  . Stress: Not on file  Relationships  . Social connections:    Talks on phone: Not on file    Gets together: Not on file    Attends religious service: Not on file    Active member of club or organization: Not on file    Attends meetings of clubs or organizations: Not on file    Relationship status: Not on file  Other Topics Concern  . Not on file  Social History Narrative  . Not on file   Family History  Problem Relation Age of Onset  . Heart attack Mother   . Pneumonia Father   . Heart attack Brother   . Alcohol abuse Brother    Scheduled Meds: . chlorhexidine gluconate (MEDLINE KIT)  15 mL Mouth Rinse BID  . Chlorhexidine Gluconate Cloth  6 each Topical Daily  . famotidine  20 mg Oral BID  . insulin aspart  0-9 Units Subcutaneous Q4H  . levothyroxine  25 mcg Oral Q0600  . mouth rinse  15 mL Mouth Rinse 10 times per day  . methylPREDNISolone (SOLU-MEDROL) injection  40 mg Intravenous Q12H  . sodium chloride flush  10-40 mL Intracatheter Q12H   Continuous Infusions: . sodium chloride 10 mL/hr at 2019-01-20 1200  . amiodarone 30 mg/hr (20-Jan-2019 1200)  . ceFEPime (MAXIPIME) IV 1 g (01-20-2019 1215)  . feeding supplement (VITAL AF 1.2 CAL)    . fentaNYL infusion INTRAVENOUS 10 mcg/hr (01/20/19 1200)  . norepinephrine (LEVOPHED) Adult infusion 12 mcg/min (2019/01/20 1200)  . vasopressin (PITRESSIN) infusion - *FOR SHOCK* 0.03 Units/min (01-20-19 1200)   PRN Meds:.acetaminophen **OR** acetaminophen, docusate, fentaNYL (SUBLIMAZE) injection, fentaNYL  (SUBLIMAZE) injection, midazolam, midazolam, sodium chloride flush No Known Allergies Review of Systems  Unable to perform ROS: Intubated    Physical Exam Vitals signs reviewed.  Constitutional:      Appearance: She is ill-appearing and toxic-appearing.     Interventions: She is intubated.  Cardiovascular:     Comments: Paced  Pulmonary:     Effort: She is intubated.  Abdominal:     Palpations: Abdomen is soft.  Neurological:     Mental Status: She is unresponsive.  Vital Signs: BP (!) 87/69   Pulse 95   Temp 98.2 F (36.8 C) (Oral)   Resp 19   Ht '5\' 7"'$  (1.702 m)   Wt 74 kg   SpO2 99%   BMI 25.55 kg/m  Pain Scale: CPOT   Pain Score: Asleep   SpO2: SpO2: 99 % O2 Device:SpO2: 99 % O2 Flow Rate: .O2 Flow Rate (L/min): 30 L/min  IO: Intake/output summary:   Intake/Output Summary (Last 24 hours) at 02/08/19 1359 Last data filed at 2019/02/08 1300 Gross per 24 hour  Intake 3250.06 ml  Output 130 ml  Net 3120.06 ml    LBM: Last BM Date: 01/10/19 Baseline Weight: Weight: 61.2 kg Most recent weight: Weight: 74 kg     Palliative Assessment/Data:   Flowsheet Rows     Most Recent Value  Intake Tab  Referral Department  Hospitalist  Unit at Time of Referral  Med/Surg Unit  Date Notified  01/14/19  Palliative Care Type  New Palliative care  Reason for referral  End of Life Care Assistance  Date of Admission  01/01/2019  # of days IP prior to Palliative referral  5  Clinical Assessment  Psychosocial & Spiritual Assessment  Palliative Care Outcomes      Time In: 1400 Time Out: 1300 Time Total: 60 min Greater than 50%  of this time was spent counseling and coordinating care related to the above assessment and plan.  Signed by: Vinie Sill, NP Palliative Medicine Team Pager # [phone] (M-F 8a-5p) Team Phone # [phone] (Nights/Weekends)

## 2019-01-29 NOTE — Progress Notes (Signed)
Wasted 100 ml of Fentanyl at this time with Bo Merino, RN

## 2019-01-29 NOTE — Progress Notes (Signed)
NAME:  Anne Macias, MRN:  [id number], DOB:  1923/08/30, LOS: 7 ADMISSION DATE:  01/26/2019, CONSULTATION DATE:  2.16.2020 REFERRING MD: Feliz-Ort[facility]-Hospitalist Service, CHIEF COMPLAINT:  Hypoxia, hypotension   Brief History   85 yo female admitted for fall and weakness with UTI. Treated appropriately, she was also diuresed. Has developed progressive dyspnea culminating in RRT and BiPap. Given ativan for agitation on bipap. Appears worse with diuresis. Recent initiation and/or increase in amiodarone as well  History of present illness   Anne Macias is an 85 y.o. female past medical history of paroxysmal atrial fibrillation on amiodarone and Coumadin. Status post pacemaker for Tachy-Brady disease. Originally presented to the hospital for evaluation of weakness and fall at home. Amiodarone started and increased about 12 days ago. Has had swelling in legs.  Past Medical History   Past Medical History:  Diagnosis Date  . Chronic anticoagulation   . Edema of lower extremity   . Hyperlipidemia   . Hypertension   . Hypothyroidism   . PAF (paroxysmal atrial fibrillation) ([place]) 1998  . S/P cardiac pacemaker procedure Sept 2130   complicated by pocket hematoma  . Tachy-brady syndrome ([place])   . Tremor 05/08/2014  . Visual disturbance    Past Surgical History:  Procedure Laterality Date  . CARDIOVERSION N/A 02/26/2014   Procedure: CARDIOVERSION;  Surgeon: Deboraha Sprang, MD;  Location: [facility] ENDOSCOPY;  Service: Cardiovascular;  Laterality: N/A;  . CARDIOVERSION N/A 03/19/2014   Procedure: CARDIOVERSION;  Surgeon: Brentlee Spark, MD;  Location: [facility] ENDOSCOPY;  Service: Cardiovascular;  Laterality: N/A;  . CARDIOVERSION N/A 06/17/2015   Procedure: CARDIOVERSION;  Surgeon: Alicen Spark, MD;  Location: [facility];  Service: Cardiovascular;  Laterality: N/A;  . CATARACT EXTRACTION    . CHOLECYSTECTOMY  2005  . INSERT / REPLACE / REMOVE PACEMAKER  08/28/2010   implantaton  . US  ECHOCARDIOGRAPHY  04/10/2010   EF 55-60%     Significant Hospital Events   Remains intubated 40%, rate of 12, CVP is 9 per documentation, remains on Levo at 16 with mottling per toes.  No urine output, creatinine continues to rise + 5 L INR is 3.19 off all anticoags x 4 days. Last dose 2/14  Consults:  Cardiology Palliative care  Procedures:  Echocardiogram ETT 2/17>>  Significant Diagnostic Tests:  Echocardiogram: 2.11.2020 IMPRESSIONS    1. The left ventricle has normal systolic function of 86-57%. The cavity size was normal. Left ventricular diastology could not be evaluated secondary to atrial fibrillation.  2. The right ventricle has moderately reduced systolic function. The cavity was normal. There is no increase in right ventricular wall thickness. Right ventricular systolic pressure is moderately elevated with an estimated pressure of 51.4 mmHg.  3. Left atrial size was moderately dilated.  4. Right atrial size was mildly dilated.  5. The mitral valve is normal in structure. There is mild thickening. There is mild to moderate mitral annular calcification present.  6. The tricuspid valve is normal in structure. Tricuspid valve regurgitation is moderate-severe.  7. The aortic valve is tricuspid There is mild thickening and mild calcification of the aortic valve. Aortic valve regurgitation is trivial by color flow Doppler.  8. The aortic root and ascending aorta are normal in size and structure.  9. The inferior vena cava was dilated in size with <50% respiratory variability.  FINDINGS  Left Ventricle: The left ventricle has normal systolic function of 84-69%. The cavity size was normal. There is no increase in left  ventricular wall thickness. Left ventricular diastology could not be evaluated secondary to atrial fibrillation. Right Ventricle: The right ventricle has moderately reduced systolic function. The cavity was normal. There is no increase in right ventricular wall  thickness. Right ventricular systolic pressure is moderately elevated with an estimated pressure of 51.4  mmHg. Pacing wire/catheter visualized in the right ventricle. Left Atrium: left atrial size was moderately dilated Right Atrium: right atrial size was mildly dilated Interatrial Septum: No atrial level shunt detected by color flow Doppler. Pericardium: There is no evidence of pericardial effusion. There is pleural effusion in both left and right lateral regions. Mitral Valve: The mitral valve is normal in structure. There is mild thickening. There is mild to moderate mitral annular calcification present. Mitral valve regurgitation is mild by color flow Doppler. Tricuspid Valve: The tricuspid valve is normal in structure. Tricuspid valve regurgitation is moderate-severe by color flow Doppler. Aortic Valve: The aortic valve is tricuspid There is mild thickening and mild calcification of the aortic valve. Aortic valve regurgitation is trivial by color flow Doppler. Pulmonic Valve: The pulmonic valve was not well visualized. The pulmonic valve was grossly normal. Pulmonic valve regurgitation is not visualized by color flow Doppler. Aorta: The aortic root and ascending aorta are normal in size and structure. Venous: The inferior vena cava is dilated in size with less than 50% respiratory variability.   LEFT VENTRICLE PLAX 2D (Te[place]) LV EF:          68.4 % LVIDd:          3.53 cm LVIDs:          2.21 cm LV PW:          0.94 cm LV IVS:         0.91 cm LVOT diam:      1.70 cm LV SV:          36 ml LVOT Area:      2.27 cm  RIGHT VENTRICLE RV S prime:     7.18 cm/s TAPSE (M-mode): 0.4 cm RVSP:           51.4 mmHg  LEFT ATRIUM             Index       RIGHT ATRIUM           Index LA diam:        4.50 cm 2.42 cm/m  RA Pressure: 15 mmHg LA Vol (A2C):   64.3 ml 34.63 ml/m RA Area:     23.70 cm LA Vol (A4C):   47.3 ml 25.48 ml/m RA Volume:   72.20 ml  38.89 ml/m LA Biplane Vol: 60.2 ml  32.42 ml/m  AORTIC VALVE LVOT Vmax:   66.30 cm/s LVOT Vmean:  49.000 cm/s LVOT VTI:    0.119 m   AORTA Ao Root diam: 2.90 cm  MITRAL VALVE              TR Peak grad: 36.4 mmHg MV Area (PHT): 5.54 cm   TR Vmax:      336.00 cm/s MV PHT:        39.73 msec RVSP:         51.4 mmHg MV Decel Time: 137 msec MV E velocity: 87.97 cm/s  IMPRESSION: Bilateral pleural effusions, right larger than left. Compressive atelectasis in the lower lobes.  Ground-glass opacities in both upper lobes, left greater than right could reflect pneumonia.  Cardiomegaly, coronary artery disease.  Aortic Atherosclerosis  IMPRESSION: 1. No hydronephrosis  or other acute findings. 2. Mild bilateral renal atrophy. Micro Data:  UCX +E.Coli >100K, pan-sensitive BCx 2/11 NGTD   Antimicrobials:  Cefepime 2/15> Vancomycin 2/15> Azithromycin 2/14>OTO Rocephin 2/10, 2/12>2/14   Objective   Blood pressure 98/64, pulse 88, temperature 98.4 F (36.9 C), temperature source Oral, resp. rate 12, height 5\' 7"  (1.702 m), weight 74 kg, SpO2 100 %. CVP:  [4 mmHg-20 mmHg] 12 mmHg  Vent Mode: PRVC FiO2 (%):  [40 %-100 %] 40 % Set Rate:  [12 bmp] 12 bmp Vt Set:  [490 mL] 490 mL PEEP:  [6 cmH20-8 cmH20] 6 cmH20 Plateau Pressure:  [11 cmH20-21 cmH20] 15 cmH20   Intake/Output Summary (Last 24 hours) at 01-18-19 8182 Last data filed at Jan 18, 2019 9937 Gross per 24 hour  Intake 3582.01 ml  Output 240 ml  Net 3342.01 ml   Filed Weights   01/14/19 0500 01/16/19 0500 January 18, 2019 0416  Weight: 75.8 kg 73.4 kg 74 kg   Physical Exam: General: Frail, critically ill appearing female, intubated and sedated HENT: [place], AT, Oral ETT, OG tube Eyes: EOMI, no scleral icterus Respiratory: Bilateral chest excursion, Diminished breath sounds bilaterally, few rhonchi, Cardiovascular: S1, S2, RRR, -M/R/G, no JVD GI: BS+, soft, non tender, ND, Obese Extremities: 2+ pitting edema in lower extremities, mottling to toes and feet  distally Neuro: Sedated, Drowsy, awakens to voice, follows commands, MAE x 4 Skin: Intact, no rashes or bruising, mottling to toes and distal feet GU: [place] in place  Assessment & Plan:   AHRF S/p thoracentesis of left effusion on 2/16. CXR 2/17 personally reviewed by me with patchy infiltrates including in RUL concerning for pneumonia and mild pulmonary edema with small effusions. Intubated 2/17 after Code status reversal PCT slight upward trend but ?  if 2/2 rising creatinine Afebrile>> WBC with increase to 13, ? If 2/2 steroids Plan: Maintain O2 saturations >88% ABG prn CXR daily and prn Narrowed to Zosyn 2/17.  Continue steroids Follow  blood cultures, urine strep and legionella Sputum Culture  Acute encephalopathy - improved Sedated with light fentanyl Plan Minimize sedation as able  Hypovolemic Hypotension Plan DC continuous IVF Monitor pressure and CVP ( goal 10-12) Gentle bolus as needed Wean Levo Start Vasopressin Consider Co-ox, but options for treatment limited Consider fluid after CXR eval  PH Echo with RVSP 51.4 Hold on diuresis due to borderline pressure and worsening AKI  Tachy-brady s/p PM Paroxysmal Atrial fibrillation Cardiology following Hold amio.  Family expresses concern that this may be contributing to her respiratory issues however unlikely to cause acute issues. AV pacing Plan: Metoprool being held by nursing Will add amiodarone at 30 mg /hr  IV for rate control Holding coumadin for now INR daily. >> Continues to rise>> 3.19 Will restart heparin when INR <1.8  AKI Suspect Secondary to volume depletion and now hypotension Creatinine continuing to rise Plan BMP daily Insert foley Strict I&O Replete electrolytes as needed May need to consider renal consult  Hyper-coag state Off all anti-coags x 4 days INR 3.19>> continues upward trend ? 2/2  shock liver Plan Monitor INR and LFT's daily Monitor for acute bleeding CBC  daily Transfuse for HGB < 7  Pt continues to have worsening renal function , INR is climbing, remains on Levo at 16 mcg. Will add amio for rate control. Will need to re-visit goals of care with patient and family today .   Best practice:  Diet: NPO Pain/Anxiety/Delirium protocol (if indicated): will monitor. No further ativan VAP protocol (if  indicated): N/A DVT prophylaxis: Hold anticoagulation for now GI prophylaxis: N/A Glucose control: CBG q4h Mobility: Limited Code Status: DNR Family Communication: No family at bedside Disposition: Remain in CVICU  Labs   CBC: Recent Labs  Lab 01/16/19 1816 02/08/19 0415  WBC  --  13.4*  HGB 14.3 12.3  HCT 42.0 43.7  MCV  --  82.1  PLT  --  161    Basic Metabolic Panel: Recent Labs  Lab 01/14/19 0605 01/15/19 0821 01/15/19 1615 01/16/19 0246 01/16/19 1816 Feb 08, 2019 0415  NA 137 139 139 140 137 140  K 3.5 4.0 3.8 4.4 4.5 4.9  CL 104 103 102 106  --  107  CO2 24 23 25  19*  --  19*  GLUCOSE 101* 106* 95 127*  --  157*  BUN 38* 43* 45* 51*  --  68*  CREATININE 1.57* 1.69* 1.78* 1.94*  --  2.43*  CALCIUM 7.5* 7.9* 7.7* 7.9*  --  7.6*  MG  --   --  2.1 2.2  --  2.3  PHOS  --   --  3.3 4.1  --  6.1*   GFR: Estimated Creatinine Clearance: 15.2 mL/min (A) (by C-G formula based on SCr of 2.43 mg/dL (H)). Recent Labs  Lab 01/15/19 1615 01/15/19 1917 01/16/19 0246 08-Feb-2019 0415  PROCALCITON 0.60  --  0.71 0.86  WBC  --   --   --  13.4*  LATICACIDVEN 2.4* 2.1*  --   --     Liver Function Tests: Recent Labs  Lab 01/15/19 1615 01/16/19 0246 February 08, 2019 0415  AST 50* 47* 61*  ALT 44 45* 52*  ALKPHOS 125 132* 167*  BILITOT 1.9* 2.1* 1.7*  PROT 5.5* 5.6* 5.5*  ALBUMIN 1.6* 1.9* 1.7*   No results for input(s): LIPASE, AMYLASE in the last 168 hours. Recent Labs  Lab 01/15/19 1615  AMMONIA 22    ABG    Component Value Date/Time   PHART 7.349 (L) 01/16/2019 1816   PCO2ART 37.0 01/16/2019 1816   PO2ART 293.0 (H)  01/16/2019 1816   HCO3 20.6 01/16/2019 1816   TCO2 22 01/16/2019 1816   ACIDBASEDEF 5.0 (H) 01/16/2019 1816   O2SAT 100.0 01/16/2019 1816     Coagulation Profile: Recent Labs  Lab 01/13/19 0611 01/14/19 0605 01/15/19 1615 01/16/19 0246 02/08/2019 0415  INR 2.70 2.54 2.50 2.66 3.19    Cardiac Enzymes: No results for input(s): CKTOTAL, CKMB, CKMBINDEX, TROPONINI in the last 168 hours.  HbA1C: No results found for: HGBA1C  CBG: Recent Labs  Lab 01/16/19 1527 01/16/19 1934 01/16/19 2337 02/08/19 0335 February 08, 2019 0746  GLUCAP 120* 123* 137* 137* 123*      Magdalen Spatz, AGACNP-BC [place] Pager # [phone] After 4 pm [phone] 02-08-19 8:34 AM

## 2019-01-29 NOTE — Progress Notes (Signed)
[facility] Progress Note Patient Name: Anne Macias DOB: 01-22-1923 MRN: [id number]   Date of Service  20-Jan-2019  HPI/Events of Note  Nursing reports bilateral mottling of toes and distal feet since Norepinephrine IV infusion started. Nursing reports good DP and PT pulses bilaterally. CVP = 6.   eICU Interventions  Will order: 1. Bolus with 0.9 NaCl 1 liter IV over 1 hour now.  2. Plan is to keep CVP in 10-12 range and attempt to wean Norepinephrine IV infusion to a lower dose and see if mottling of feet/toes improves.      Intervention Category Major Interventions: Other:  Sommer,Steven Cornelia Copa 01-20-19, 12:09 AM

## 2019-01-29 NOTE — Progress Notes (Signed)
Initial Nutrition Assessment  DOCUMENTATION CODES:   Not applicable  INTERVENTION:    Initiate Vital AF 1.2 at goal rate of 45 ml/h (1080 ml per day)   Provides 1296 kcals, 81 gm protein, 876 ml free water daily  NUTRITION DIAGNOSIS:   Inadequate oral intake related to inability to eat as evidenced by NPO status  GOAL:   Patient will meet greater than or equal to 90% of their needs  MONITOR:   Vent status, TF tolerance, Labs, Skin, Weight trends, I & O's  REASON FOR ASSESSMENT:   Consult Enteral/tube feeding initiation and management  ASSESSMENT:   85 year old Female with pAF on coumadin, tachy-brady syndrome s/p PM, chronic diastolic heart failure admitted for weakness and fall at home. Transferred to ICU for acute hypoxemic respiratory failure on BiPAP.  2/16 transferred from 3W to [place]  Patient is currently intubated on ventilator support MV: 5.7 L/min Temp (24hrs), Avg:97.1 F (36.2 C), Min:96.1 F (35.6 C), Max:98.4 F (36.9 C)  OGT in place  Pt in acute respiratory failure d/t PNA, PE and heart failure. She is s/p L thoracentesis asp pleural space 2/16. Septic shock; on pressor support.  Prior to transfer pt was on a Heart Healthy diet. PO intake was ~50% per flowsheet records. Labs & medications reviewed. Phos 6.1 (H).  CBG's [id number].  NUTRITION - FOCUSED PHYSICAL EXAM:  Unable to assess at this time  Diet Order:   Diet Order            Diet NPO time specified  Diet effective now             EDUCATION NEEDS:   Not appropriate for education at this time  Skin:  Skin Assessment: Reviewed RN Assessment  Last BM:  2/11   Intake/Output Summary (Last 24 hours) at 02-01-19 1318 Last data filed at 02/01/2019 1300 Gross per 24 hour  Intake 3250.06 ml  Output 130 ml  Net 3120.06 ml   Height:   Ht Readings from Last 1 Encounters:  01/16/19 5\' 7"  (1.702 m)   Weight:   Wt Readings from Last 1 Encounters:  02/01/2019 74 kg   BMI:   Body mass index is 25.55 kg/m.  Estimated Nutritional Needs:   Kcal:  1262  Protein:  80-95 gm  Fluid:  per MD  Arthur Holms, RD, LDN Pager #: [phone] After-Hours Pager #: [phone]

## 2019-01-29 NOTE — Death Summary Note (Signed)
DEATH SUMMARY   Patient Details  Name: Anne Macias MRN: [id number] DOB: Dec 05, 1922  Admission/Discharge Information   Admit Date:  01/15/19  Date of Death: Date of Death: 2019-01-23  Time of Death: Time of Death: 33  Length of Stay: February 10, 2023  Referring Physician: Lavone Orn, MD   Reason(s) for Hospitalization  Weakness and Fall  Diagnoses  Preliminary cause of death: Septic shock secondary to pneumonia Secondary Diagnoses (including complications and co-morbidities):  Principal Problem:   CAP (community acquired pneumonia) Active Problems:   Atrial fibrillation, persistent   Fall   Anticoagulated on Coumadin   UTI (urinary tract infection)   Generalized weakness   Pleural effusion   CKD (chronic kidney disease) stage 3, GFR 30-59 ml/min (HCC)   Hypothyroidism   Acute kidney injury superimposed on CKD ([place])   Acute respiratory failure with hypoxia ([place])   Acute on chronic diastolic heart failure [facility])   Palliative care encounter   [facility] Course (including significant findings, care, treatment, and services provided and events leading to death)  Anne Macias is a 85 y.o. year old female who presented for weakness and fall and admitted for acute respiratory failure secondary to pneumonia and heart failure. She was initially admitted to Hospitalist service. She was diuresed and underwent thoracentesis for pleural effusion with IR. However, despite management she was transferred to ICU on BiPAP for progressively worsening respiratory status and development of hypotension with diuresis. Hypotension persisted and she was treated with IVF and peripheral pressor support for suspected septic shock. After discussion with family, DNR was reversed and patient was intubated for respiratory failure. Respiratory status improved however she developed AKI and became anuric. Her hypotension also demonstrated increased pressor requirement and she developed early signs of digital  ischemia. After discussion of patient's medical course, family made the decision to transition to withdraw care.  Patient expired on 01/23/2019 at 1440.  Pertinent Labs and Studies  Significant Diagnostic Studies Dg Chest 2 View  Result Date: January 15, 2019 CLINICAL DATA:  Fall EXAM: CHEST - 2 VIEW COMPARISON:  06/19/2014 FINDINGS: Right-sided dual lead pacing device as before. Small left-sided pleural effusion. Moderate right pleural effusion. Mild cardiomegaly with aortic atherosclerosis. Ill-defined opacity in the left perihilar region. Consolidation at both bases. No pneumothorax. IMPRESSION: 1. Bilateral pleural effusions, moderate on the right and small on the left. Bibasilar consolidations which may reflect atelectasis or pneumonia. 2. Ill-defined left perihilar region opacity, may reflect infiltrate although mass not excluded. Short interval radiographic follow-up and/or CT may be considered for further evaluation. Electronically Signed   By: Donavan Foil M.D.   On: 01-15-2019 22:34   Ct Chest Wo Contrast  Result Date: 01/10/2019 CLINICAL DATA:  Pleural effusions EXAM: CT CHEST WITHOUT CONTRAST TECHNIQUE: Multidetector CT imaging of the chest was performed following the standard protocol without IV contrast. COMPARISON:  01/15/2019 FINDINGS: Cardiovascular: Aortic and coronary artery calcifications. Tortuosity of the thoracic aorta. No aortic aneurysm. Cardiomegaly. Pacer in place with leads in the right atrium and right ventricle. Mediastinum/Nodes: No mediastinal, hilar, or axillary adenopathy. Lungs/Pleura: Moderate to large right effusion with small to moderate left effusion. Compressive atelectasis in the lower lobes. Ground-glass airspace opacities in both upper lobes, left greater than right. Upper Abdomen: Imaging into the upper abdomen shows no acute findings. Musculoskeletal: Chest wall soft tissues are unremarkable. No acute bony abnormality. IMPRESSION: Bilateral pleural effusions, right  larger than left. Compressive atelectasis in the lower lobes. Ground-glass opacities in both upper lobes, left greater than right  could reflect pneumonia. Cardiomegaly, coronary artery disease. Aortic Atherosclerosis (ICD10-I70.0). Electronically Signed   By: Rolm Baptise M.D.   On: 01/10/2019 03:21   US Renal  Result Date: 01/10/2019 CLINICAL DATA:  85 year old female with history of acute kidney disease. EXAM: RENAL / URINARY TRACT ULTRASOUND COMPLETE COMPARISON:  None. FINDINGS: Right Kidney: Renal measurements: 8.9 x 4.6 x 4.4 cm = volume: 93 mL . Echogenicity within normal limits. Diffuse cortical thinning with expansion of the central sinus echo complex. No mass or hydronephrosis visualized. Left Kidney: Renal measurements: 9.8 x 5.5 x 5.5 cm = volume: 154 mL. Echogenicity within normal limits. Diffuse cortical thinning with expansion of the central sinus echo complex. No mass or hydronephrosis visualized. Bladder: Appears normal for degree of bladder distention. IMPRESSION: 1. No hydronephrosis or other acute findings. 2. Mild bilateral renal atrophy. Electronically Signed   By: Vinnie Langton M.D.   On: 01/10/2019 05:03   Dg Chest Port 1 View  Result Date: Feb 15, 2019 CLINICAL DATA:  Respiratory failure EXAM: PORTABLE CHEST 1 VIEW COMPARISON:  Chest radiograph from one day prior. FINDINGS: Endotracheal tube tip is 1.5 cm above the carina. Enteric tube enters stomach with the tip not seen on this image. Left internal jugular central venous catheter terminates in the lower third of the SVC. Stable configuration of 2 lead right subclavian pacemaker. Stable cardiomediastinal silhouette with mild cardiomegaly. No pneumothorax. Small bilateral pleural effusions, increased on the right and stable on the left. Mild-to-moderate pulmonary edema with slightly improved on the right and slightly worsened on the left. Stable bibasilar atelectasis. IMPRESSION: 1. Endotracheal tube tip 1.5 cm above the carina.  Additional support structures as detailed. 2. Small bilateral pleural effusions, increased on the right and stable on the left. 3. Mild-to-moderate congestive heart failure, overall similar, see comments. Electronically Signed   By: Ilona Sorrel M.D.   On: February 15, 2019 10:32   Dg Chest Port 1 View  Result Date: 01/16/2019 CLINICAL DATA:  84 year old female ET tube reposition, enteric tube. EXAM: PORTABLE CHEST 1 VIEW COMPARISON:  1800 hours today. FINDINGS: Portable AP semi upright view at 2049 hours. Endotracheal tube tip now projects between the level the clavicles and carina in good position. Stable left IJ central line. The enteric tube side hole is visible now, at the level of the gastric body. Increased veiling opacity at both lung bases from the earlier film. Stable cardiac size and mediastinal contours. The bilateral pulmonary interstitial opacity is less apparent. No pneumothorax. Stable right chest pacemaker. Paucity bowel gas in the upper abdomen. IMPRESSION: 1. ET tube in good position. Enteric tube side hole at the level of the gastric body. 2. Increased lung base veiling opacity since 1800 hours suggesting pleural effusions. Stable ventilation otherwise. Electronically Signed   By: Genevie Ann M.D.   On: 01/16/2019 21:34   Dg Chest Port 1 View  Result Date: 01/16/2019 CLINICAL DATA:  85 year old female with worsening hypoxia, intubated. EXAM: PORTABLE CHEST 1 VIEW COMPARISON:  0438 hours today and earlier. FINDINGS: Portable AP semi upright view at 1800 hours. Endotracheal tube tip courses to the level of the carina. A left IJ central line has been placed, tip projects about 1 vertebral body below the level of the carina. Enteric tube has been placed and courses to the left abdomen, tip not included. Stable right chest pacemaker. Larger lung volumes. Stable cardiac size and mediastinal contours. No pneumothorax. Bilateral reticulonodular pulmonary opacity appears more generalized in less focal now.  Possible small pleural effusions. Stable  retrocardiac denser opacity from this morning. Paucity of bowel gas in the upper abdomen. IMPRESSION: 1. ETT tip near the carina.  Retract 1 cm for optimal placement. 2. Left IJ central line placed, tip at the lower SVC level. No pneumothorax. 3. Enteric tube courses to the left abdomen, tip not included. 4. More generalized bilateral reticulonodular pulmonary opacity, more suggestive of infection than edema. No areas of worsening ventilation possible small pleural effusions. Electronically Signed   By: Genevie Ann M.D.   On: 01/16/2019 20:13   Dg Chest Port 1 View  Result Date: 01/16/2019 CLINICAL DATA:  85 year old female with shortness of breath and pulmonary edema. Subsequent encounter. EXAM: PORTABLE CHEST 1 VIEW COMPARISON:  01/15/2019 FINDINGS: Sequential pacemaker enters from the right with leads unchanged in position. Mild cardiomegaly. Patchy asymmetric airspace disease similar to prior exam with minimal improvement aeration right upper lobe. This is suggestive of multifocal pneumonia although there may also be a component of pulmonary edema. No pneumothorax identified. Pleural effusions may be present sub pulmonic in position on the right. Calcified aorta. IMPRESSION: 1. Patchy asymmetric airspace disease similar to prior exam with minimal improvement aeration right upper lobe. This is suggestive of multifocal pneumonia although there may also be a component of pulmonary edema. 2. Mild cardiomegaly with sequential pacemaker in place. 3. Right-sided sub pulmonic effusion suspected. Small left-sided pleural effusion. 4.  Aortic Atherosclerosis (ICD10-I70.0). Electronically Signed   By: Genia Del M.D.   On: 01/16/2019 07:19   Dg Chest Port 1 View  Result Date: 01/15/2019 CLINICAL DATA:  Post LEFT thoracentesis. EXAM: PORTABLE CHEST 1 VIEW 9:29 a.m.: COMPARISON:  Portable chest x-ray earlier same day at 7:38 a.m. and previously. FINDINGS: No evidence of  pneumothorax after LEFT thoracentesis. Resolution of the LEFT pleural effusion. Stable moderate-sized RIGHT pleural effusion. Slight improvement in the diffuse BILATERAL airspace opacity since earlier this morning. Heart mildly enlarged but stable. RIGHT subclavian dual lead transvenous pacemaker unchanged. IMPRESSION: 1. No pneumothorax after LEFT thoracentesis. Resolution of the LEFT pleural effusion. 2. Improving multifocal BILATERAL pneumonia. 3. Stable moderate-sized RIGHT pleural effusion. Electronically Signed   By: Evangeline Dakin M.D.   On: 01/15/2019 10:30   Dg Chest Port 1 View  Result Date: 01/15/2019 CLINICAL DATA:  Hypoxia EXAM: PORTABLE CHEST 1 VIEW COMPARISON:  January 14, 2019 chest radiograph and chest CT January 10, 2019 FINDINGS: There is airspace consolidation throughout much of the right lung as well as in the left mid lower lung zones. There are small pleural effusions bilaterally. There is mild cardiomegaly with pulmonary vascularity grossly normal. Pacemaker leads are attached to the right atrium and right ventricle. There is aortic atherosclerosis. No adenopathy. No bone lesions. IMPRESSION: Extensive airspace consolidation bilaterally, likely representing multifocal pneumonia. There may be a degree of superimposed alveolar edema. There are pleural effusions bilaterally. There is stable cardiac prominence with pacemaker leads attached to right atrium and right ventricle. Aortic Atherosclerosis (ICD10-I70.0). Electronically Signed   By: Lowella Grip III M.D.   On: 01/15/2019 08:06   Dg Chest Port 1 View  Result Date: 01/14/2019 CLINICAL DATA:  Shortness of breath. EXAM: PORTABLE CHEST 1 VIEW COMPARISON:  CT 01/10/2019.  Radiographs 01/23/2019. FINDINGS: 1042 hours. Right subclavian pacemaker leads are unchanged at the right atrium and right ventricular apex. The heart size and mediastinal contours are stable. There is aortic atherosclerosis. Moderate size bilateral pleural  effusions have not significantly changed. However, there are worsening bilateral perihilar airspace opacities and poor definition of the pulmonary  vasculature. No pneumothorax. The bones appear unchanged. IMPRESSION: 1. Worsening bilateral perihilar opacities could reflect edema or atypical pneumonia. 2. Underlying bilateral pleural effusions and bibasilar atelectasis have not significantly changed from the recent prior studies. Electronically Signed   By: Richardean Sale M.D.   On: 01/14/2019 11:18   US Thoracentesis Asp Pleural Space W/img Guide  Result Date: 01/15/2019 INDICATION: Shortness of breath. Respiratory distress. Bilateral pleural effusions. Request therapeutic thoracentesis. EXAM: ULTRASOUND GUIDED LEFT THORACENTESIS MEDICATIONS: None. COMPLICATIONS: None immediate. PROCEDURE: An ultrasound guided thoracentesis was thoroughly discussed with the patient and questions answered. The benefits, risks, alternatives and complications were also discussed. The patient understands and wishes to proceed with the procedure. Written consent was obtained. Ultrasound was performed to localize and mark an adequate pocket of fluid in the left chest. The area was then prepped and draped in the normal sterile fashion. 1% Lidocaine was used for local anesthesia. Under ultrasound guidance a 6 Fr Safe-T-Centesis catheter was introduced. Thoracentesis was performed. The catheter was removed and a dressing applied. FINDINGS: A total of approximately 600 mL of clear yellow fluid was removed. IMPRESSION: Successful ultrasound guided left thoracentesis yielding 600 mL of pleural fluid. Read by: Ascencion Dike PA-C Electronically Signed   By: Sandi Mariscal M.D.   On: 01/15/2019 10:24    Microbiology Recent Results (from the past 240 hour(s))  Urine culture     Status: Abnormal   Collection Time: 01/26/2019 11:51 PM  Result Value Ref Range Status   Specimen Description URINE, CLEAN CATCH  Final   Special Requests   Final     NONE Performed at [facility], [place] [address], [place], [place] [address]    Culture >=100,000 COLONIES/mL ESCHERICHIA COLI (A)  Final   Report Status 01/12/2019 FINAL  Final   Organism ID, Bacteria ESCHERICHIA COLI (A)  Final      Susceptibility   Escherichia coli - MIC*    AMPICILLIN <=2 SENSITIVE Sensitive     CEFAZOLIN <=4 SENSITIVE Sensitive     CEFTRIAXONE <=1 SENSITIVE Sensitive     CIPROFLOXACIN <=0.25 SENSITIVE Sensitive     GENTAMICIN <=1 SENSITIVE Sensitive     IMIPENEM <=0.25 SENSITIVE Sensitive     NITROFURANTOIN <=16 SENSITIVE Sensitive     TRIMETH/SULFA <=20 SENSITIVE Sensitive     AMPICILLIN/SULBACTAM <=2 SENSITIVE Sensitive     PIP/TAZO <=4 SENSITIVE Sensitive     Extended ESBL NEGATIVE Sensitive     * >=100,000 COLONIES/mL ESCHERICHIA COLI  Blood culture (routine x 2)     Status: None   Collection Time: 01/10/19  1:30 AM  Result Value Ref Range Status   Specimen Description BLOOD RIGHT ANTECUBITAL  Final   Special Requests   Final    BOTTLES DRAWN AEROBIC AND ANAEROBIC Blood Culture adequate volume   Culture   Final    NO GROWTH 5 DAYS Performed at [facility], [place] [address] [place], [place] [address]    Report Status 01/15/2019 FINAL  Final  Blood culture (routine x 2)     Status: None   Collection Time: 01/10/19  1:34 AM  Result Value Ref Range Status   Specimen Description BLOOD RIGHT FOREARM  Final   Special Requests   Final    BOTTLES DRAWN AEROBIC AND ANAEROBIC Blood Culture results may not be optimal due to an inadequate volume of blood received in culture bottles   Culture   Final    NO GROWTH 5 DAYS Performed at [facility], [place] [address] [place], [place] [address]    Report Status 01/15/2019 FINAL  Final  MRSA PCR Screening     Status: None   Collection Time: 01/14/19  1:01 PM  Result Value Ref Range Status   MRSA by PCR NEGATIVE NEGATIVE Final    Comment:        The GeneXpert MRSA Assay (FDA approved for NASAL  specimens only), is one component of a comprehensive MRSA colonization surveillance program. It is not intended to diagnose MRSA infection nor to guide or monitor treatment for MRSA infections. Performed at [facility], [place] [address] [place], [place] [address]   Culture, blood (routine x 2)     Status: None (Preliminary result)   Collection Time: 01/16/19  9:16 AM  Result Value Ref Range Status   Specimen Description BLOOD RIGHT ANTECUBITAL  Final   Special Requests   Final    BOTTLES DRAWN AEROBIC ONLY Blood Culture adequate volume   Culture   Final    NO GROWTH 3 DAYS Performed at [facility], [place] [address], [place], [place] [address]    Report Status PENDING  Incomplete  Culture, blood (routine x 2)     Status: None (Preliminary result)   Collection Time: 01/16/19  9:23 AM  Result Value Ref Range Status   Specimen Description BLOOD LEFT HAND  Final   Special Requests   Final    BOTTLES DRAWN AEROBIC ONLY Blood Culture results may not be optimal due to an inadequate volume of blood received in culture bottles   Culture   Final    NO GROWTH 3 DAYS Performed at [facility], [place] [address], [place], [place] [address]    Report Status PENDING  Incomplete    Lab Basic Metabolic Panel: Recent Labs  Lab 01/14/19 0605 01/15/19 0821 01/15/19 1615 01/16/19 0246 01/16/19 1816 02-12-19 0415  NA 137 139 139 140 137 140  K 3.5 4.0 3.8 4.4 4.5 4.9  CL 104 103 102 106  --  107  CO2 24 23 25  19*  --  19*  GLUCOSE 101* 106* 95 127*  --  157*  BUN 38* 43* 45* 51*  --  68*  CREATININE 1.57* 1.69* 1.78* 1.94*  --  2.43*  CALCIUM 7.5* 7.9* 7.7* 7.9*  --  7.6*  MG  --   --  2.1 2.2  --  2.3  PHOS  --   --  3.3 4.1  --  6.1*   Liver Function Tests: Recent Labs  Lab 01/15/19 1615 01/16/19 0246 Feb 12, 2019 0415  AST 50* 47* 61*  ALT 44 45* 52*  ALKPHOS 125 132* 167*  BILITOT 1.9* 2.1* 1.7*  PROT 5.5* 5.6* 5.5*  ALBUMIN 1.6* 1.9* 1.7*   No results for  input(s): LIPASE, AMYLASE in the last 168 hours. Recent Labs  Lab 01/15/19 1615  AMMONIA 22   CBC: Recent Labs  Lab 01/16/19 1816 02/12/2019 0415 February 12, 2019 1128  WBC  --  13.4*  --   HGB 14.3 12.3  --   HCT 42.0 43.7  --   MCV  --  82.1  --   PLT  --  333 324   Cardiac Enzymes: No results for input(s): CKTOTAL, CKMB, CKMBINDEX, TROPONINI in the last 168 hours. Sepsis Labs: Recent Labs  Lab 01/15/19 1615 01/15/19 1917 01/16/19 0246 02-12-2019 0415  PROCALCITON 0.60  --  0.71 0.86  WBC  --   --   --  13.4*  LATICACIDVEN 2.4* 2.1*  --   --     Procedures/Operations  2/17 Intubation 2/17 L IJ CVC   Javel Hersh Rodman Pickle 01/19/2019, 2:24 PM

## 2019-01-29 NOTE — Procedures (Signed)
Extubation Procedure Note  Patient Details:   Name: Anne Macias DOB: 1923-11-29 MRN: [id number]   Airway Documentation:    Vent end date: 2019-01-29 Vent end time: 1432   Pt extubated to RA per Withdrawal of Life protocol  Jesse Sans 01/29/2019, 2:33 PM

## 2019-01-29 NOTE — Progress Notes (Signed)
Progress Note  Patient Name: SORIYAH OSBERG Date of Encounter: 18-Jan-2019  Primary Cardiologist: Dr. Caryl Comes  Subjective   Developed more respiratory distress and underwent intubation last evening.   Inpatient Medications    Scheduled Meds: . chlorhexidine gluconate (MEDLINE KIT)  15 mL Mouth Rinse BID  . Chlorhexidine Gluconate Cloth  6 each Topical Daily  . famotidine  20 mg Oral BID  . insulin aspart  0-9 Units Subcutaneous Q4H  . levothyroxine  25 mcg Oral Q0600  . mouth rinse  15 mL Mouth Rinse 10 times per day  . methylPREDNISolone (SOLU-MEDROL) injection  40 mg Intravenous Q12H  . metoprolol tartrate  5 mg Intravenous Q6H  . sodium chloride flush  10-40 mL Intracatheter Q12H   Continuous Infusions: . sodium chloride 20 mL/hr at 2019/01/18 0700  . ceFEPime (MAXIPIME) IV Stopped (01/16/19 1244)  . fentaNYL infusion INTRAVENOUS 25 mcg/hr (Jan 18, 2019 0700)  . norepinephrine (LEVOPHED) Adult infusion 16 mcg/min (2019/01/18 0751)   PRN Meds: acetaminophen **OR** acetaminophen, docusate, fentaNYL (SUBLIMAZE) injection, fentaNYL (SUBLIMAZE) injection, midazolam, midazolam, sodium chloride flush   Vital Signs    Vitals:   01-18-19 0600 2019-01-18 0700 01-18-19 0734 01/18/2019 0748  BP: 1'04/69 96/64 98/64 '$   Pulse: 86 86 88   Resp: 12 12    Temp:    98.4 F (36.9 C)  TempSrc:    Oral  SpO2: 98% 100% 100%   Weight:      Height:        Intake/Output Summary (Last 24 hours) at 18-Jan-2019 0815 Last data filed at 01/18/2019 0752 Gross per 24 hour  Intake 3582.01 ml  Output 240 ml  Net 3342.01 ml    I/O since admission: I/O +5241  Filed Weights   01/14/19 0500 01/16/19 0500 01/18/2019 0416  Weight: 75.8 kg 73.4 kg 74 kg    Telemetry    Paced rhythm with variable AV pacing and V pacing - Personally Reviewed  ECG    ECG (independently read by me): AF; RBBB; rate 89, with intermittent paced beats  Physical Exam   BP 98/64   Pulse 88   Temp 98.4 F (36.9 C) (Oral)    Resp 12   Ht '5\' 7"'$  (1.702 m)   Wt 74 kg   SpO2 100%   BMI 25.55 kg/m  General: sedated and intubated on vent Skin: normal turgor, no rashes, warm and dry HEENT: Normocephalic, atraumatic. Pupils equal round and reactive to light; sclera anicteric; extraocular muscles intact;  Nose without nasal septal hypertrophy Mouth/Parynx not assessed Neck: No JVD, no carotid bruits; normal carotid upstroke Lungs: clear to ausculatation and percussion; no wheezing or rales Chest wall: without tenderness to palpitation Heart: PMI not displaced, rate in the 80s with pacing, s1 s2 normal, 1/6 systolic murmur, no diastolic murmur, no rubs, gallops, thrills, or heaves Abdomen: soft, nontender; no hepatosplenomehaly, BS+; abdominal aorta nontender and not dilated by palpation. Back: no CVA tenderness Pulses 2+ Extremities:1-2 + LA/ankle edema with mild toe mottling,  no clubbing  Homan's sign negative  Neurologic: grossly nonfocal;    Labs    Chemistry Recent Labs  Lab 01/15/19 1615 01/16/19 0246 01/16/19 1816 18-Jan-2019 0415  NA 139 140 137 140  K 3.8 4.4 4.5 4.9  CL 102 106  --  107  CO2 25 19*  --  19*  GLUCOSE 95 127*  --  157*  BUN 45* 51*  --  68*  CREATININE 1.78* 1.94*  --  2.43*  CALCIUM 7.7*  7.9*  --  7.6*  PROT 5.5* 5.6*  --  5.5*  ALBUMIN 1.6* 1.9*  --  1.7*  AST 50* 47*  --  61*  ALT 44 45*  --  52*  ALKPHOS 125 132*  --  167*  BILITOT 1.9* 2.1*  --  1.7*  GFRNONAA 24* 22*  --  17*  GFRAA 28* 25*  --  19*  ANIONGAP 12 15  --  14     Hematology Recent Labs  Lab 01/16/19 1816 February 13, 2019 0415  WBC  --  13.4*  RBC  --  5.32*  HGB 14.3 12.3  HCT 42.0 43.7  MCV  --  82.1  MCH  --  23.1*  MCHC  --  28.1*  RDW  --  17.8*  PLT  --  333    Cardiac Enzymes No results for input(s): TROPONINI in the last 168 hours. No results for input(s): TROPIPOC in the last 168 hours.   BNP Recent Labs  Lab 01/14/19 1404 13-Feb-2019 0407  BNP 1,007.5* 1,074.6*     DDimer No  results for input(s): DDIMER in the last 168 hours.   Lipid Panel  No results found for: CHOL, TRIG, HDL, CHOLHDL, VLDL, LDLCALC, LDLDIRECT   Radiology    Dg Chest Port 1 View  Result Date: 01/16/2019 CLINICAL DATA:  85 year old female ET tube reposition, enteric tube. EXAM: PORTABLE CHEST 1 VIEW COMPARISON:  1800 hours today. FINDINGS: Portable AP semi upright view at 2049 hours. Endotracheal tube tip now projects between the level the clavicles and carina in good position. Stable left IJ central line. The enteric tube side hole is visible now, at the level of the gastric body. Increased veiling opacity at both lung bases from the earlier film. Stable cardiac size and mediastinal contours. The bilateral pulmonary interstitial opacity is less apparent. No pneumothorax. Stable right chest pacemaker. Paucity bowel gas in the upper abdomen. IMPRESSION: 1. ET tube in good position. Enteric tube side hole at the level of the gastric body. 2. Increased lung base veiling opacity since 1800 hours suggesting pleural effusions. Stable ventilation otherwise. Electronically Signed   By: Genevie Ann M.D.   On: 01/16/2019 21:34   Dg Chest Port 1 View  Result Date: 01/16/2019 CLINICAL DATA:  85 year old female with worsening hypoxia, intubated. EXAM: PORTABLE CHEST 1 VIEW COMPARISON:  0438 hours today and earlier. FINDINGS: Portable AP semi upright view at 1800 hours. Endotracheal tube tip courses to the level of the carina. A left IJ central line has been placed, tip projects about 1 vertebral body below the level of the carina. Enteric tube has been placed and courses to the left abdomen, tip not included. Stable right chest pacemaker. Larger lung volumes. Stable cardiac size and mediastinal contours. No pneumothorax. Bilateral reticulonodular pulmonary opacity appears more generalized in less focal now. Possible small pleural effusions. Stable retrocardiac denser opacity from this morning. Paucity of bowel gas in the  upper abdomen. IMPRESSION: 1. ETT tip near the carina.  Retract 1 cm for optimal placement. 2. Left IJ central line placed, tip at the lower SVC level. No pneumothorax. 3. Enteric tube courses to the left abdomen, tip not included. 4. More generalized bilateral reticulonodular pulmonary opacity, more suggestive of infection than edema. No areas of worsening ventilation possible small pleural effusions. Electronically Signed   By: Genevie Ann M.D.   On: 01/16/2019 20:13   Dg Chest Port 1 View  Result Date: 01/16/2019 CLINICAL DATA:  85 year old female with shortness  of breath and pulmonary edema. Subsequent encounter. EXAM: PORTABLE CHEST 1 VIEW COMPARISON:  01/15/2019 FINDINGS: Sequential pacemaker enters from the right with leads unchanged in position. Mild cardiomegaly. Patchy asymmetric airspace disease similar to prior exam with minimal improvement aeration right upper lobe. This is suggestive of multifocal pneumonia although there may also be a component of pulmonary edema. No pneumothorax identified. Pleural effusions may be present sub pulmonic in position on the right. Calcified aorta. IMPRESSION: 1. Patchy asymmetric airspace disease similar to prior exam with minimal improvement aeration right upper lobe. This is suggestive of multifocal pneumonia although there may also be a component of pulmonary edema. 2. Mild cardiomegaly with sequential pacemaker in place. 3. Right-sided sub pulmonic effusion suspected. Small left-sided pleural effusion. 4.  Aortic Atherosclerosis (ICD10-I70.0). Electronically Signed   By: Genia Del M.D.   On: 01/16/2019 07:19   Dg Chest Port 1 View  Result Date: 01/15/2019 CLINICAL DATA:  Post LEFT thoracentesis. EXAM: PORTABLE CHEST 1 VIEW 9:29 a.m.: COMPARISON:  Portable chest x-ray earlier same day at 7:38 a.m. and previously. FINDINGS: No evidence of pneumothorax after LEFT thoracentesis. Resolution of the LEFT pleural effusion. Stable moderate-sized RIGHT pleural  effusion. Slight improvement in the diffuse BILATERAL airspace opacity since earlier this morning. Heart mildly enlarged but stable. RIGHT subclavian dual lead transvenous pacemaker unchanged. IMPRESSION: 1. No pneumothorax after LEFT thoracentesis. Resolution of the LEFT pleural effusion. 2. Improving multifocal BILATERAL pneumonia. 3. Stable moderate-sized RIGHT pleural effusion. Electronically Signed   By: Evangeline Dakin M.D.   On: 01/15/2019 10:30   US Thoracentesis Asp Pleural Space W/img Guide  Result Date: 01/15/2019 INDICATION: Shortness of breath. Respiratory distress. Bilateral pleural effusions. Request therapeutic thoracentesis. EXAM: ULTRASOUND GUIDED LEFT THORACENTESIS MEDICATIONS: None. COMPLICATIONS: None immediate. PROCEDURE: An ultrasound guided thoracentesis was thoroughly discussed with the patient and questions answered. The benefits, risks, alternatives and complications were also discussed. The patient understands and wishes to proceed with the procedure. Written consent was obtained. Ultrasound was performed to localize and mark an adequate pocket of fluid in the left chest. The area was then prepped and draped in the normal sterile fashion. 1% Lidocaine was used for local anesthesia. Under ultrasound guidance a 6 Fr Safe-T-Centesis catheter was introduced. Thoracentesis was performed. The catheter was removed and a dressing applied. FINDINGS: A total of approximately 600 mL of clear yellow fluid was removed. IMPRESSION: Successful ultrasound guided left thoracentesis yielding 600 mL of pleural fluid. Read by: Ascencion Dike PA-C Electronically Signed   By: Sandi Mariscal M.D.   On: 01/15/2019 10:24    Cardiac Studies    Echo 01/10/19 1. The left ventricle has normal systolic function of 93-26%. The cavity size was normal. Left ventricular diastology could not be evaluated secondary to atrial fibrillation. 2. The right ventricle has moderately reduced systolic function. The cavity  was normal. There is no increase in right ventricular wall thickness. Right ventricular systolic pressure is moderately elevated with an estimated pressure of 51.4 mmHg. 3. Left atrial size was moderately dilated. 4. Right atrial size was mildly dilated. 5. The mitral valve is normal in structure. There is mild thickening. There is mild to moderate mitral annular calcification present. 6. The tricuspid valve is normal in structure. Tricuspid valve regurgitation is moderate-severe. 7. The aortic valve is tricuspid There is mild thickening and mild calcification of the aortic valve. Aortic valve regurgitation is trivial by color flow Doppler. 8. The aortic root and ascending aorta are normal in size and structure. 9. The  inferior vena cava was dilated in size with <50% respiratory variability.  Patient Profile     85 y.o. female with a hx of paroxysmal afib on amiodarone and coumadin, tachy-brady syndrome with permanent pacemaker, HTN, and hyperlipidemia who is being followed for atrial fibrillation.  Assessment & Plan    1. Respiratory Failure;  Required intubation yesterday after failed high flow and bipap. Marland Kitchen DNR was reversed.    2.  Currently requiring pressor support; levophed started yesterday,now  at 16 SBP goal > 90  3. PAF: Appears to be intermittently AV pacing and at other times just V pacing. Never received iv amiodarone.  Continues on iv lopressor 5 mg q 6hrs , HR in the 80s. BP soft, now 88.  Will change to 2.5 q 6 and potentially back down levophed with current vasoconstriction.   4. Acute on chronic diastolic heart failure;  Underwent thoracentesis  600 cc removed.  BNP continues to be elevated today 1074. LE edema; receieved fluid bolus last evening with low CVP and toe mottling on levophed.  3. AKI:  Cr increased to 2.43 today  4. Permanent Pacemaker  5. CAP; no longer on vanc, currently on cefepine  6. Mild LFT elevation   7. Anticogualation; warfarin on hold  last dose 2/14;  INR increased today from 2.66 yesterday to 3.19 today. ? development of coagulapathy.   Time 40 minutes Signed, Troy Sine, MD, [facility] 18-Jan-2019, 8:15 AM

## 2019-01-29 NOTE — Care Plan (Signed)
Family meeting held with husband, daughter and son regarding patient's overall medical status. We discussed how her respiratory status has improved however she remains hypotensive and is now in renal failure with possible hepatic injury resulting in elevated INR. We discussed if we would want to pursue aggressive care such as dialysis at this point. Family agreed that further medical management would not be best and have opted to transition to comfort care. Family requested for chaplain.

## 2019-01-29 NOTE — Progress Notes (Signed)
Responded to page and dr.request to be with family at beside as they prepare to withdraw life support. Prayed with family at beside and provided empathetic listening, emotional and spiritual support. Also supported staff as needed.   Jaclynn Major, [place], [facility], Pager [phone]

## 2019-01-29 NOTE — Progress Notes (Signed)
PT Cancellation Note  Patient Details Name: Anne Macias MRN: [id number] DOB: 03-Feb-1923   Cancelled Treatment:    Reason Eval/Treat Not Completed: Medical issues which prohibited therapy(currently in ICU with intubation and worsening renal function, awaiting update AS:TMHDQ) will hold therapies at this time.   Duncan Dull Jan 28, 2019, 11:42 AM

## 2019-01-29 DEATH — deceased

## 2019-01-30 ENCOUNTER — Ambulatory Visit: Payer: Medicare Other | Admitting: Internal Medicine

## 2020-01-02 NOTE — Telephone Encounter (Signed)
Error
